# Patient Record
Sex: Female | Born: 1975 | Race: Black or African American | Hispanic: No | Marital: Married | Smoking: Former smoker
Health system: Southern US, Community
[De-identification: ages and names within clinical notes are randomized; demographics above are authoritative.]

## PROBLEM LIST (undated history)

## (undated) DIAGNOSIS — R51 Headache: Secondary | ICD-10-CM

## (undated) DIAGNOSIS — Z8619 Personal history of other infectious and parasitic diseases: Secondary | ICD-10-CM

## (undated) HISTORY — DX: Personal history of other infectious and parasitic diseases: Z86.19

## (undated) HISTORY — DX: Headache: R51

---

## 2000-01-08 ENCOUNTER — Emergency Department (HOSPITAL_COMMUNITY): Admission: EM | Admit: 2000-01-08 | Discharge: 2000-01-08 | Payer: Self-pay | Admitting: Emergency Medicine

## 2000-02-10 ENCOUNTER — Emergency Department (HOSPITAL_COMMUNITY): Admission: EM | Admit: 2000-02-10 | Discharge: 2000-02-10 | Payer: Self-pay | Admitting: Emergency Medicine

## 2000-02-10 ENCOUNTER — Encounter: Payer: Self-pay | Admitting: Emergency Medicine

## 2000-02-25 ENCOUNTER — Inpatient Hospital Stay (HOSPITAL_COMMUNITY): Admission: AD | Admit: 2000-02-25 | Discharge: 2000-02-25 | Payer: Self-pay | Admitting: *Deleted

## 2000-04-12 ENCOUNTER — Inpatient Hospital Stay (HOSPITAL_COMMUNITY): Admission: AD | Admit: 2000-04-12 | Discharge: 2000-04-12 | Payer: Self-pay | Admitting: *Deleted

## 2000-06-14 ENCOUNTER — Other Ambulatory Visit: Admission: RE | Admit: 2000-06-14 | Discharge: 2000-06-14 | Payer: Self-pay | Admitting: Obstetrics

## 2000-07-16 ENCOUNTER — Inpatient Hospital Stay (HOSPITAL_COMMUNITY): Admission: AD | Admit: 2000-07-16 | Discharge: 2000-07-16 | Payer: Self-pay | Admitting: Obstetrics

## 2000-08-14 ENCOUNTER — Other Ambulatory Visit: Admission: RE | Admit: 2000-08-14 | Discharge: 2000-08-14 | Payer: Self-pay | Admitting: Obstetrics

## 2000-09-30 ENCOUNTER — Inpatient Hospital Stay (HOSPITAL_COMMUNITY): Admission: AD | Admit: 2000-09-30 | Discharge: 2000-10-03 | Payer: Self-pay | Admitting: *Deleted

## 2000-12-10 ENCOUNTER — Emergency Department (HOSPITAL_COMMUNITY): Admission: EM | Admit: 2000-12-10 | Discharge: 2000-12-10 | Payer: Self-pay | Admitting: Emergency Medicine

## 2002-04-02 ENCOUNTER — Emergency Department (HOSPITAL_COMMUNITY): Admission: EM | Admit: 2002-04-02 | Discharge: 2002-04-02 | Payer: Self-pay | Admitting: Emergency Medicine

## 2004-08-21 ENCOUNTER — Emergency Department (HOSPITAL_COMMUNITY): Admission: EM | Admit: 2004-08-21 | Discharge: 2004-08-21 | Payer: Self-pay | Admitting: Emergency Medicine

## 2005-09-22 ENCOUNTER — Ambulatory Visit: Payer: Self-pay | Admitting: Family Medicine

## 2006-05-22 ENCOUNTER — Ambulatory Visit: Payer: Self-pay | Admitting: Internal Medicine

## 2006-05-26 ENCOUNTER — Encounter: Payer: Self-pay | Admitting: Internal Medicine

## 2006-05-26 LAB — CONVERTED CEMR LAB

## 2006-05-29 ENCOUNTER — Encounter: Payer: Self-pay | Admitting: Internal Medicine

## 2006-05-29 ENCOUNTER — Other Ambulatory Visit: Admission: RE | Admit: 2006-05-29 | Discharge: 2006-05-29 | Payer: Self-pay | Admitting: Internal Medicine

## 2006-05-29 ENCOUNTER — Ambulatory Visit: Payer: Self-pay | Admitting: Internal Medicine

## 2006-06-23 ENCOUNTER — Ambulatory Visit: Payer: Self-pay | Admitting: Family Medicine

## 2007-06-18 ENCOUNTER — Encounter: Payer: Self-pay | Admitting: Internal Medicine

## 2007-11-13 ENCOUNTER — Ambulatory Visit: Payer: Self-pay | Admitting: Internal Medicine

## 2007-11-13 LAB — CONVERTED CEMR LAB
ALT: 14 units/L (ref 0–35)
AST: 20 units/L (ref 0–37)
Albumin: 4.1 g/dL (ref 3.5–5.2)
Alkaline Phosphatase: 43 units/L (ref 39–117)
BUN: 7 mg/dL (ref 6–23)
Basophils Absolute: 0 10*3/uL (ref 0.0–0.1)
Basophils Relative: 0.7 % (ref 0.0–1.0)
Bilirubin Urine: NEGATIVE
Bilirubin, Direct: 0.1 mg/dL (ref 0.0–0.3)
Blood in Urine, dipstick: NEGATIVE
CO2: 31 meq/L (ref 19–32)
Calcium: 9.8 mg/dL (ref 8.4–10.5)
Chloride: 106 meq/L (ref 96–112)
Cholesterol: 221 mg/dL (ref 0–200)
Creatinine, Ser: 0.6 mg/dL (ref 0.4–1.2)
Direct LDL: 128.2 mg/dL
Eosinophils Absolute: 0 10*3/uL (ref 0.0–0.6)
Eosinophils Relative: 0.7 % (ref 0.0–5.0)
GFR calc Af Amer: 152 mL/min
GFR calc non Af Amer: 126 mL/min
Glucose, Bld: 74 mg/dL (ref 70–99)
Glucose, Urine, Semiquant: NEGATIVE
HCT: 38.6 % (ref 36.0–46.0)
HDL: 80.8 mg/dL (ref >39.0)
Hemoglobin: 12.8 g/dL (ref 12.0–15.0)
Ketones, urine, test strip: NEGATIVE
Lymphocytes Relative: 33.7 % (ref 12.0–46.0)
MCHC: 33.2 g/dL (ref 30.0–36.0)
MCV: 95.1 fL (ref 78.0–100.0)
Monocytes Absolute: 0.5 10*3/uL (ref 0.2–0.7)
Monocytes Relative: 9.9 % (ref 3.0–11.0)
Neutro Abs: 2.6 10*3/uL (ref 1.4–7.7)
Neutrophils Relative %: 55 % (ref 43.0–77.0)
Nitrite: POSITIVE
Platelets: 239 10*3/uL (ref 150–400)
Potassium: 4.3 meq/L (ref 3.5–5.1)
RBC: 4.06 M/uL (ref 3.87–5.11)
RDW: 11.9 % (ref 11.5–14.6)
Sodium: 143 meq/L (ref 135–145)
Specific Gravity, Urine: 1.02
TSH: 0.64 microintl units/mL (ref 0.35–5.50)
Total Bilirubin: 0.7 mg/dL (ref 0.3–1.2)
Total CHOL/HDL Ratio: 2.7
Total Protein: 6.9 g/dL (ref 6.0–8.3)
Triglycerides: 40 mg/dL (ref 0–149)
Urobilinogen, UA: 1
VLDL: 8 mg/dL (ref 0–40)
WBC Urine, dipstick: NEGATIVE
WBC: 4.7 10*3/uL (ref 4.5–10.5)
pH: 7.5

## 2007-11-26 ENCOUNTER — Telehealth: Payer: Self-pay | Admitting: Internal Medicine

## 2008-01-14 ENCOUNTER — Encounter: Payer: Self-pay | Admitting: Internal Medicine

## 2008-01-14 ENCOUNTER — Other Ambulatory Visit: Admission: RE | Admit: 2008-01-14 | Discharge: 2008-01-14 | Payer: Self-pay | Admitting: Internal Medicine

## 2008-01-14 ENCOUNTER — Ambulatory Visit: Payer: Self-pay | Admitting: Internal Medicine

## 2008-01-14 DIAGNOSIS — N926 Irregular menstruation, unspecified: Secondary | ICD-10-CM | POA: Insufficient documentation

## 2008-06-26 ENCOUNTER — Ambulatory Visit: Payer: Self-pay | Admitting: Internal Medicine

## 2008-06-26 ENCOUNTER — Telehealth: Payer: Self-pay | Admitting: Internal Medicine

## 2008-06-26 DIAGNOSIS — G43019 Migraine without aura, intractable, without status migrainosus: Secondary | ICD-10-CM | POA: Insufficient documentation

## 2008-06-26 DIAGNOSIS — F4322 Adjustment disorder with anxiety: Secondary | ICD-10-CM | POA: Insufficient documentation

## 2008-06-26 DIAGNOSIS — G43009 Migraine without aura, not intractable, without status migrainosus: Secondary | ICD-10-CM | POA: Insufficient documentation

## 2008-07-02 ENCOUNTER — Ambulatory Visit: Payer: Self-pay | Admitting: Internal Medicine

## 2009-01-02 ENCOUNTER — Ambulatory Visit: Payer: Self-pay | Admitting: Family Medicine

## 2009-01-02 DIAGNOSIS — J039 Acute tonsillitis, unspecified: Secondary | ICD-10-CM | POA: Insufficient documentation

## 2010-07-06 ENCOUNTER — Other Ambulatory Visit: Admission: RE | Admit: 2010-07-06 | Discharge: 2010-07-06 | Payer: Self-pay | Admitting: Internal Medicine

## 2010-07-06 ENCOUNTER — Ambulatory Visit: Payer: Self-pay | Admitting: Internal Medicine

## 2010-07-06 DIAGNOSIS — R519 Headache, unspecified: Secondary | ICD-10-CM | POA: Insufficient documentation

## 2010-07-06 DIAGNOSIS — F17200 Nicotine dependence, unspecified, uncomplicated: Secondary | ICD-10-CM | POA: Insufficient documentation

## 2010-07-06 DIAGNOSIS — R51 Headache: Secondary | ICD-10-CM | POA: Insufficient documentation

## 2010-07-06 DIAGNOSIS — M542 Cervicalgia: Secondary | ICD-10-CM | POA: Insufficient documentation

## 2010-07-06 LAB — CONVERTED CEMR LAB
Bilirubin Urine: NEGATIVE
Glucose, Urine, Semiquant: NEGATIVE
Nitrite: NEGATIVE
Prolactin: 12.7 ng/mL
Protein, U semiquant: NEGATIVE
Specific Gravity, Urine: 1.025
Urobilinogen, UA: 0.2
WBC Urine, dipstick: NEGATIVE
pH: 5.5

## 2010-07-08 ENCOUNTER — Encounter: Payer: Self-pay | Admitting: *Deleted

## 2010-07-08 LAB — CONVERTED CEMR LAB
ALT: 12 units/L (ref 0–35)
AST: 18 units/L (ref 0–37)
Albumin: 4.3 g/dL (ref 3.5–5.2)
Alkaline Phosphatase: 41 units/L (ref 39–117)
BUN: 12 mg/dL (ref 6–23)
Basophils Absolute: 0 10*3/uL (ref 0.0–0.1)
Basophils Relative: 0.4 % (ref 0.0–3.0)
Bilirubin, Direct: 0.1 mg/dL (ref 0.0–0.3)
CO2: 27 meq/L (ref 19–32)
Calcium: 9.5 mg/dL (ref 8.4–10.5)
Chloride: 104 meq/L (ref 96–112)
Cholesterol: 205 mg/dL — ABNORMAL HIGH (ref 0–200)
Creatinine, Ser: 0.7 mg/dL (ref 0.4–1.2)
Direct LDL: 117.1 mg/dL
Eosinophils Absolute: 0 10*3/uL (ref 0.0–0.7)
Eosinophils Relative: 0.4 % (ref 0.0–5.0)
Free T4: 0.87 ng/dL (ref 0.60–1.60)
GFR calc non Af Amer: 126.88 mL/min (ref >60)
Glucose, Bld: 83 mg/dL (ref 70–99)
HCT: 37.3 % (ref 36.0–46.0)
HDL: 73 mg/dL (ref >39.00)
Hemoglobin: 12.9 g/dL (ref 12.0–15.0)
Lymphocytes Relative: 32.6 % (ref 12.0–46.0)
Lymphs Abs: 2 10*3/uL (ref 0.7–4.0)
MCHC: 34.5 g/dL (ref 30.0–36.0)
MCV: 96.5 fL (ref 78.0–100.0)
Monocytes Absolute: 0.5 10*3/uL (ref 0.1–1.0)
Monocytes Relative: 8.2 % (ref 3.0–12.0)
Neutro Abs: 3.5 10*3/uL (ref 1.4–7.7)
Neutrophils Relative %: 58.4 % (ref 43.0–77.0)
Platelets: 257 10*3/uL (ref 150.0–400.0)
Potassium: 4.4 meq/L (ref 3.5–5.1)
RBC: 3.87 M/uL (ref 3.87–5.11)
RDW: 12.5 % (ref 11.5–14.6)
Sodium: 138 meq/L (ref 135–145)
TSH: 0.85 microintl units/mL (ref 0.35–5.50)
Total Bilirubin: 0.6 mg/dL (ref 0.3–1.2)
Total CHOL/HDL Ratio: 3
Total Protein: 7.3 g/dL (ref 6.0–8.3)
Triglycerides: 47 mg/dL (ref 0.0–149.0)
VLDL: 9.4 mg/dL (ref 0.0–40.0)
WBC: 6 10*3/uL (ref 4.5–10.5)

## 2010-07-11 ENCOUNTER — Telehealth: Payer: Self-pay | Admitting: Internal Medicine

## 2010-07-13 LAB — CONVERTED CEMR LAB: Pap Smear: NEGATIVE

## 2010-10-25 NOTE — Progress Notes (Signed)
Summary: vomiting and ha  Phone Note From Other Clinic   Caller: [facility] Summary of Call: Pt stopped by over at the [facility] office because she wasn't feeling good. Pt is having a vomiting and ha but no fever. I told Louellen that if they didn't want to see her, she can either come here or go to ED. Louellen said that she would tell the pt. Initial call taken by: Romualdo Bolk, CMA,  June 26, 2008 9:32 AM  Follow-up for Phone Call        Pt was informed to go to the ER or to the [place] office. Follow-up by: Hilarie Fredrickson,  June 26, 2008 9:40 AM

## 2010-10-25 NOTE — Letter (Signed)
Summary: Out of Work  Adult nurse at [employer]  [address]   [place], [place] [address]   Phone: [phone]  Fax: [phone]    June 26, 2008   Employee:  LIANNE CARRETO [facility]    To Whom It May Concern:   For Medical reasons, please excuse the above named employee from work for the following dates:  Start:   October  2nd   End:   October 5th   If you need additional information, please feel free to contact our office.         Sincerely,    Madelin Headings MD

## 2010-10-25 NOTE — Assessment & Plan Note (Signed)
Summary: ha/nausea/njr   Vital Signs:  Patient Profile:   35 Years Old Female Height:     63 inches Weight:      106 pounds Temp:     98.4 degrees F oral Pulse rate:   80 / minute BP sitting:   120 / 80  (right arm) Cuff size:   regular  Vitals Entered By: Romualdo Bolk, CMA (June 26, 2008 1:17 PM)                 Chief Complaint:  Headache.  History of Present Illness: Kathy Steele is here for an acute workin for a  severe headache that started on 06/24/08. Pt is also having chills and vomiting. Pt states that she is sensitive to light and sound.  Took  tylenol sinus.   Onset in from of head with throbbing sensation. Chills with nausea but no fever cough uri o uti signs   Missed work yesterday    and  cold and hot .   Went to therapy this am and therapistrec rx for anxiety also. to go weekly .  Marland Kitchen  On ocp  s for 1- plus  .  recent soda drinking   from stress.   no etoh.   doesnt think its related to periods. Son was attacked by a  do  on the face and has ahd therapy doing better .  Dayton Scrape    [id number]  [id number] is the psychologist.    Prior Medications Reviewed Using: Patient Recall  Prior Medication List:  OCELLA 3-0.03 MG  TABS (DROSPIRENONE-ETHINYL ESTRADIOL) 1 by mouth once daily.   Current Allergies (reviewed today): No known allergies   Past Medical History:    childbirth  g1p1    chikenpox   Past Surgical History:    CB  10/01/00    Family History:    Family History Hypertension  mom and gm and aunt      DM   uncles paternal    MI  maternal grandparents      neg colon cancer depression,      ? blood clot in GM     Family History of Arthritis    ? no fam hx of migraine.   Social History:    hh or 4 fiance, husband son and step son.    Mom smokes when visits.      sleep usually ok    Single  trainer works long days  [employer]    Never Smoked    Alcohol use-no    Regular exercise-no    to be married June 6      Review of  Systems  The patient denies fever, vision loss, dyspnea on exertion, peripheral edema, prolonged cough, hemoptysis, abdominal pain, melena, hematochezia, severe indigestion/heartburn, transient blindness, abnormal bleeding, and enlarged lymph nodes.     Physical Exam  General:     well-developed and well-nourished.  in mild to moderate distress    in dark room  Head:     normocephalic, atraumatic, and no abnormalities observed.   Eyes:     vision grossly intact, pupils equal, pupils round, and pupils reactive to light.  photophobia Ears:     R ear normal and L ear normal.   Nose:     no external deformity and no nasal discharge.   Mouth:     good dentition and pharynx pink and moist.   Neck:     No deformities, masses, or tenderness noted.no  thyromegaly.   Lungs:     Normal respiratory effort, chest expands symmetrically. Lungs are clear to auscultation, no crackles or wheezes. Heart:     Normal rate and regular rhythm. S1 and S2 normal without gallop, murmur, click, rub or other extra sounds. Abdomen:     Bowel sounds positive,abdomen soft and non-tender without masses, organomegaly or hernias noted. Msk:     normal ROM and no joint tenderness.   Pulses:     lintact Extremities:     no cce  Neurologic:     strength normal in all extremities and DTRs symmetrical and normal.  non focal Skin:     turgor normal and color normal.   Cervical Nodes:     No lymphadenopathy noted Psych:     anxious and tearful in mild distresss coherent  and appropriate.    Impression & Recommendations:  Problem # 1:  MIGRAINE W/O AURA W/INTRACT W/O STATUS MIGRNOSUS (ICD-346.11) Assessment: New non focal exam. disc and rx toradol and phenergan .  alarm signs discussed and ho x 1 .  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 1 by mouth once daily. 2)  Promethazine Hcl 25 Mg Tabs (Promethazine hcl) .Marland Kitchen.. 1 by mouth q 4-6 hours as needed nausea or vomiting .  Other Orders: Ketorolac-Toradol 15mg  (U9811) Admin of Therapeutic Inj  intramuscular or subcutaneous (91478)   Patient Instructions: 1)  take anti nausea medication  and no  work today.   2)  after about 6 hours can take 600-800mg  of ibuprofen for headache is not resolving   (  3-4 advil)   3)  Fluids and rest for now . 4)  schedule rov next week for follow up anxiety ( can use sda) . 5)  Call on call person if high fever or feel getting worse .     Prescriptions: PROMETHAZINE HCL 25 MG TABS (PROMETHAZINE HCL) 1 by mouth q 4-6 hours as needed nausea or vomiting .  #20 x 0   Entered and Authorized by:   Madelin Headings MD   Signed by:   Madelin Headings MD on 06/26/2008   Method used:   Elect[facility]ically to        [id number]       [address]       [place], [place]  [address]       Ph: [phone] or [phone]       Fax: [phone]   RxID:   [id number]  ]  Medication Administration  Injection # 1:    Medication: Ketorolac-Toradol 15mg     Diagnosis: COMMON MIGRAINE (ICD-346.10)    Route: IM    Site: LUOQ gluteus    Exp Date: 05/26/2009    Lot #: [id number]    Mfr: novaplus    Comments: Gave 60mg     Patient tolerated injection without complications    Given by: Romualdo Bolk, CMA (June 26, 2008 2:16 PM)  Orders Added: 1)  Ketorolac-Toradol 15mg  [J1885] 2)  Admin of Therapeutic Inj  intramuscular or subcutaneous [96372] 3)  Est. Patient Level IV [88416]

## 2010-10-25 NOTE — Assessment & Plan Note (Signed)
Summary: PAIN IN EYES, SWOLLEN NECK/LD   Vital Signs:  Patient profile:   35 year old female Weight:      112 pounds BMI:     19.91 Temp:     98.3 degrees F oral BP sitting:   102 / 72  (left arm) Cuff size:   large  Vitals Entered By: Alfred Levins, CMA (January 02, 2009 10:36 AM) CC: st, swoolen gland, no fever, chills, weak x3 days   History of Present Illness: 3 days of bad ST, HA, sinus pressure, and PND. No cough or fever or NVD.   Allergies (verified): No Known Drug Allergies  Past History:  Past Medical History:    Reviewed history from 06/26/2008 and no changes required:    childbirth  g1p1    chikenpox   Review of Systems  The patient denies anorexia, fever, weight loss, weight gain, vision loss, decreased hearing, hoarseness, chest pain, syncope, dyspnea on exertion, peripheral edema, prolonged cough, hemoptysis, abdominal pain, melena, hematochezia, severe indigestion/heartburn, hematuria, incontinence, genital sores, muscle weakness, suspicious skin lesions, transient blindness, difficulty walking, depression, unusual weight change, abnormal bleeding, enlarged lymph nodes, angioedema, breast masses, and testicular masses.    Physical Exam  General:  Well-developed,well-nourished,in no acute distress; alert,appropriate and cooperative throughout examination Head:  Normocephalic and atraumatic without obvious abnormalities. No apparent alopecia or balding. Eyes:  No corneal or conjunctival inflammation noted. EOMI. Perrla. Funduscopic exam benign, without hemorrhages, exudates or papilledema. Vision grossly normal. Ears:  left is clear. Right TM is slightly pink Nose:  External nasal examination shows no deformity or inflammation. Nasal mucosa are pink and moist without lesions or exudates. Mouth:  posterior OP red and covered with exudate, especially the right tonsil Neck:  tender swollen right AC nodes   Impression & Recommendations:  Problem # 1:  TONSILLITIS,  EXUDATIVE (ICD-463)  Complete Medication List: 1)  Ocella 3-0.03 Mg Tabs (Drospirenone-ethinyl estradiol) .Marland Kitchen.. 1 by mouth once daily. 2)  Promethazine Hcl 25 Mg Tabs (Promethazine hcl) .Marland Kitchen.. 1 by mouth q 4-6 hours as needed nausea or vomiting . 3)  Lexapro 10 Mg Tabs (Escitalopram oxalate) .... 1/2 by mouth once daily for about a week then can increase to 1 by mouth once daily 4)  Ativan 0.5 Mg Tabs (Lorazepam) .... 1/2 to 1 by mouth three times a day as needed anxiety attack 5)  Omnicef 300 Mg Caps (Cefdinir) .... 2 once daily  Patient Instructions: 1)  Please schedule a follow-up appointment as needed .  Prescriptions: OMNICEF 300 MG CAPS (CEFDINIR) 2 once daily  #20 x 0   Entered and Authorized by:   Nelwyn Salisbury MD   Signed by:   Nelwyn Salisbury MD on 01/02/2009   Method used:   [facility]        [id number]       [address]       [place], [place]  [address]       Ph: [phone] or [phone]       Fax: [phone]   RxID:   [id number]

## 2010-10-25 NOTE — Progress Notes (Signed)
Summary: refill and blood work results   Phone Note Call from Patient Call back at Home Phone [phone]   Caller: patient live Call For: Kathy Steele  Summary of Call: She had an apt Monday for her physical.  Due to the weather she is now scheduled for 03-02-08.  She needs Ocella 3-0.03 mg  [facility] [address]   She already had her blood work done and would like the results.    Initial call taken by: Roselle Locus,  November 26, 2007 12:44 PM  Follow-up for Phone Call        Sent thru EMR. Left message to call back. Follow-up by: Romualdo Bolk, CMA,  November 26, 2007 1:55 PM  Additional Follow-up for Phone Call Additional follow up Details #1::        Please call pt back at her work number at [phone]. Thanks. Additional Follow-up by: Lynann Beaver CMA,  November 27, 2007 11:15 AM    Additional Follow-up for Phone Call Additional follow up Details #2::    please find a place for her cpx before june      shannon Follow-up by: Madelin Headings MD,  December 01, 2007 10:55 PM  Additional Follow-up for Phone Call Additional follow up Details #3:: Details for Additional Follow-up Action Taken: Left message to call back. Can she do 12/24/07 at 1:15pm? ..................................................................Marland KitchenRomualdo Bolk, CMA  December 02, 2007 1:01 PM Pt aware and appts made. Additional Follow-up by: Romualdo Bolk, CMA,  December 02, 2007 1:11 PM  New/Updated Medications: OCELLA 3-0.03 MG  TABS (DROSPIRENONE-ETHINYL ESTRADIOL) 1 by mouth once daily.   Prescriptions: OCELLA 3-0.03 MG  TABS (DROSPIRENONE-ETHINYL ESTRADIOL) 1 by mouth once daily.  #28 x 3   Entered by:   Romualdo Bolk, CMA   Authorized by:   Madelin Headings MD   Signed by:   Romualdo Bolk, CMA on 11/26/2007   Method used:   Elect[facility]ically [facility] to ...       [id number]       [address]       [place], [place]  [address]       Ph: [phone] or [phone]    Fax: [phone]   RxID:   [id number]

## 2010-10-25 NOTE — Letter (Signed)
Summary: Generic Letter  [place] at [facility]  [address] [place], [place] [address]   Phone: [phone]  Fax: [phone]    07/08/2010  [facility] [address] [place], [place]  [address]  Dear Ms. MCCOY,  (1) TSH (TSH)   FastTSH                   0.85 uIU/mL                 0.35-5.50  Tests: (2) Hepatic/Liver Function Panel (HEPATIC)   Total Bilirubin           0.6 mg/dL                   5.7-8.4   Direct Bilirubin          0.1 mg/dL                   6.9-6.2   Alkaline Phosphatase      41 U/L                      39-117   AST                       18 U/L                      0-37   ALT                       12 U/L                      0-35   Total Protein             7.3 g/dL                    9.5-2.8   Albumin                   4.3 g/dL                    4.1-3.2  Tests: (3) CBC Platelet w/Diff (CBCD)   White Cell Count          6.0 K/uL                    4.5-10.5   Red Cell Count            3.87 Mil/uL                 3.87-5.11   Hemoglobin                12.9 g/dL                   44.0-10.2   Hematocrit                37.3 %                      36.0-46.0   MCV                       96.5 fl                     78.0-100.0   MCHC  34.5 g/dL                   16.1-09.6   RDW                       12.5 %                      11.5-14.6   Platelet Count            257.0 K/uL                  150.0-400.0   Neutrophil %              58.4 %                      43.0-77.0   Lymphocyte %              32.6 %                      12.0-46.0   Monocyte %                8.2 %                       3.0-12.0   Eosinophils%              0.4 %                       0.0-5.0   Basophils %               0.4 %                       0.0-3.0   Neutrophill Absolute      3.5 K/uL                    1.4-7.7   Lymphocyte Absolute       2.0 K/uL                    0.7-4.0   Monocyte Absolute         0.5 K/uL                    0.1-1.0  Eosinophils, Absolute                          0.0 K/uL                    0.0-0.7   Basophils Absolute        0.0 K/uL                    0.0-0.1  Tests: (4) BMP (METABOL)   Sodium                    138 mEq/L                   135-145   Potassium                 4.4 mEq/L                   3.5-5.1   Chloride  104 mEq/L                   96-112   Carbon Dioxide            27 mEq/L                    19-32   Glucose                   83 mg/dL                    10-27   BUN                       12 mg/dL                    2-53   Creatinine                0.7 mg/dL                   6.6-4.4   Calcium                   9.5 mg/dL                   0.3-47.4   GFR                       126.88 mL/min               >60  Tests: (5) Lipid Panel (LIPID)   Cholesterol          [H]  205 mg/dL                   2-595     ATP III Classification            Desirable:  < 200 mg/dL                    Borderline High:  200 - 239 mg/dL               High:  > = 240 mg/dL   Triglycerides             47.0 mg/dL                  6.3-875.6     Normal:  <150 mg/dL     Borderline High:  433 - 199 mg/dL   HDL                       29.51 mg/dL                 >88.41   VLDL Cholesterol          9.4 mg/dL                   6.6-06.3  CHO/HDL Ratio:  CHD Risk                             3                    Men          Women     1/2 Average Risk     3.4          3.3  Average Risk          5.0          4.4     2X Average Risk          9.6          7.1     3X Average Risk          15.0          11.0                           Tests: (6) T4, Free (FT4R)   Free T4                   0.87 ng/dL                  0.60-1.60  Tests: (7) Cholesterol LDL - Direct (DIRLDL)  Cholesterol LDL - Direct                             117.1 mg/dL     Optimal:  <811 mg/dL     Near or Above Optimal:  100-129 mg/dL     Borderline High:  914-782 mg/dL     High:  956-213 mg/dL     Very High:  >086 mg/dL   Your labs are normal. If  you have any questions, please give [place] a call at [phone].      Sincerely,   Tor Netters, CMA (AAMA)

## 2010-10-25 NOTE — Assessment & Plan Note (Signed)
Summary: cpx/pap/njr   Vital Signs:  Patient profile:   35 year old female Menstrual status:  irregular LMP:     05/22/2010 Height:      62.75 inches Weight:      115 pounds BMI:     20.61 Pulse rate:   60 / minute BP sitting:   100 / 60  (right arm) Cuff size:   regular  Vitals Entered By: Romualdo Bolk, CMA Duncan Dull) (July 06, 2010 3:04 PM) CC: CPX with pap LMP (date): 05/22/2010     Menstrual Status irregular Enter LMP: 05/22/2010 Last PAP Result done   History of Present Illness: Kathy Steele comes in today  for preventive visit  Her last visit was 2 years ago. since that time she has married   and is on no rx medications  however  has a number of concerns :  She still has  recurrent HEadaches and has neck and tenderness left neck without injury or associated signs . Right handed   ? if should go to chiropractor.S   ince last visit trying to deal with this headache  taking excedrin  extremem    most days of week.   3-4   days per week  for the last 3-4 months.   ? uti  frequency increasing. last week and now    seems to be going away.  Tobacco:  she has begin tobacco a few day a week to calm her down.   Still is anxious and had se of med of lexapro given 2 years ago.   She still has irreg menses lasting  3-4 days  off ocps  conisdering conception and no pregnancy over the last 6 months. no vision changes  balance problems weight changes .   No galactorrhea .  sleep 5 hours  ? from stresss       Preventive Screening-Counseling & Management  Alcohol-Tobacco     Alcohol drinks/day: <1     Alcohol type: wine     Smoking Status: never     Smoking Cessation Counseling: YES  Caffeine-Diet-Exercise     Caffeine use/day: 0     Does Patient Exercise: no  Hep-HIV-STD-Contraception     Dental Visit-last 6 months no  Safety-Violence-Falls     Seat Belt Use: yes     Firearms in the Home: no firearms in the home     Smoke Detectors: yes  Current Medications  (verified): 1)  None  Allergies (verified): 1)  Lexapro (Escitalopram Oxalate)  Past History:  Past Medical History: childbirth  g1p1 chikenpox  Headaches   Past History:  Care Management: None Current  Family History: Family History Hypertension  mom and gm and aunt   DM   uncles paternal  GM maternal  MI  maternal grandparents   neg colon cancer depression,   ? blood clot in GM  Family History of Arthritis   ? no fam hx of migraine.  OA   Sibs good .   Social History: hh or 3 , husband son  Mom smokes when visits.   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   some at home  ocass togacco  Alcohol use-no Regular exercise-no sleep 5 hours   shift 2- 10 30  Caffeine use/day:  0 Seat Belt Use:  yes Dental Care w/in 6 mos.:  no  Review of Systems  The patient denies anorexia, fever, weight loss,  weight gain, vision loss, decreased hearing, chest pain, syncope, dyspnea on exertion, peripheral edema, prolonged cough, hemoptysis, abdominal pain, melena, hematochezia, severe indigestion/heartburn, hematuria, incontinence, genital sores, muscle weakness, transient blindness, difficulty walking, depression, unusual weight change, abnormal bleeding, enlarged lymph nodes, angioedema, and breast masses.    Physical Exam  General:  alert, well-developed, well-nourished, and well-hydrated.   Head:  normocephalic and atraumatic.   Eyes:  PERRL, EOMs full, conjunctiva clear  Ears:  R ear normal and L ear normal.   Nose:  no external deformity, no external erythema, and no nasal discharge.   Mouth:  good dentition and pharynx pink and moist.   Neck:  supple, full ROM, and no masses.  tight trapezious   left  Breasts:  No mass, nodules, thickening, tenderness, bulging, retraction, inflamation, nipple discharge or skin changes noted.   Lungs:  Normal respiratory effort, chest expands symmetrically. Lungs are clear to auscultation, no  crackles or wheezes. Heart:  Normal rate and regular rhythm. S1 and S2 normal without gallop, murmur, click, rub or other extra sounds.no lifts.   Abdomen:  Bowel sounds positive,abdomen soft and non-tender without masses, organomegaly or hernias noted. Genitalia:  Pelvic Exam:        External: normal female genitalia without lesions or masses        Vagina: normal without lesions or masses        Cervix: normal without lesions or masses        Adnexa: normal bimanual exam without masses or fullness        Uterus: normal by palpation        Pap smear: performed Msk:  normal ROM, no joint swelling, no joint warmth, no redness over joints, and no joint deformities.   Pulses:  pulses intact without delay   Extremities:  no clubbing cyanosis or edema  Neurologic:  alert & oriented X3, strength normal in all extremities, gait normal, and DTRs symmetrical and normal.  cn 3-12 ok  Skin:  turgor normal, color normal, no ecchymoses, and no petechiae.   Cervical Nodes:  No lymphadenopathy noted Axillary Nodes:  No palpable lymphadenopathy Psych:  Oriented X3, normally interactive, good eye contact, not depressed appearing, and slightly anxious.     Impression & Recommendations:  Problem # 1:  PREVENTIVE HEALTH CARE (ICD-V70.0)  Discussed nutrition,exercise,diet,healthy weight, vitamin D and calcium.   sleep   Orders: TLB-TSH (Thyroid Stimulating Hormone) (84443-TSH) TLB-Hepatic/Liver Function Pnl (80076-HEPATIC) TLB-CBC Platelet - w/Differential (85025-CBCD) TLB-BMP (Basic Metabolic Panel-BMET) (80048-METABOL) TLB-Lipid Panel (80061-LIPID) UA Dipstick w/o Micro (automated)  (81003) Venipuncture (56213) Specimen Handling (08657)  Problem # 2:  ROUTINE GYNECOLOGICAL EXAMINATION (ICD-V72.31)  pap done  Orders: Pap Smear, Thin Prep ( Collection of) (Q4696)  Problem # 3:  HEADACHE (ICD-784.0)  chronic daily  and probable rebound.  from the excederin   disc caffiene in med   . HO x 2  exercises  calendar  ha and meds   Orders: T-Prolactin [phone]) Headache Clinic Referral (Headache)  Her updated medication list for this problem includes:    Excedrin Migraine 250-250-65 Mg Tabs (Aspirin-acetaminophen-caffeine) ..... ? excedrin extreme  Problem # 4:  IRREGULAR MENSTRUAL CYCLE (ICD-626.4) some concern about fertility      The following medications were removed from the medication list:    Ocella 3-0.03 Mg Tabs (Drospirenone-ethinyl estradiol) .Marland Kitchen... 1 by mouth once daily.  Orders: T-Prolactin [phone]) TLB-T4 (Thyrox), Free [phone])  Problem # 5:  OTHER PSYCHOLOGICAL OR PHYSICAL STRESS NEC OTHER (ICD-V62.89)  disc  exercise other interventions     get HAs under control and then  then readdress as needed   Problem # 6:  TOBACCO USE (ICD-305.1) stop     Problem # 7:  NECK PAIN (ICD-723.1)  seems postural  and contributing  to HAs    disc and ho x 2 with exercises .  Her updated medication list for this problem includes:    Excedrin Migraine 250-250-65 Mg Tabs (Aspirin-acetaminophen-caffeine) ..... ? excedrin extreme  Complete Medication List: 1)  Excedrin Migraine 250-250-65 Mg Tabs (Aspirin-acetaminophen-caffeine) .... ? excedrin extreme  Other Orders: Admin 1st Vaccine (04540) Flu Vaccine 73yrs + (98119) Tdap => 9yrs IM (14782) Admin of Any Addtl Vaccine (95621)  Patient Instructions: 1)  i think your  HAs are now chronic and  being somewhat caused by taking excedrin so often . excedrin has caffiene in it. this can make your anxious and decrease sleep.  2)  Calendar your HAs and meds taken  3)  Get more sleep  7-8 hours regularly . 4)  Increase exercise such as walking  5)  Check work station.  to help posture 6)  Will contact you about a HA referral. 7)  Consider seeing a fertility gyne if no success after 6 months . 8)  Tobacco is very bad for your health and your loved ones ! You should stop smoking !   Flu Vaccine Consent Questions       Do you have a history of severe allergic reactions to this vaccine? no    Any prior history of allergic reactions to egg and/or gelatin? no    Do you have a sensitivity to the preservative Thimersol? no    Do you have a past history of Guillan-Barre Syndrome? no    Do you currently have an acute febrile illness? no    Have you ever had a severe reaction to latex? no    Vaccine information given and explained to patient? yes    Are you currently pregnant? no    Lot Number:[id number]   Exp Date:03/25/2011   Site Given  Left Deltoid IM Romualdo Bolk, CMA (AAMA)  July 06, 2010 3:08 PM   Immunizations Administered:  Tetanus Vaccine:    Vaccine Type: Tdap    Site: right deltoid    Mfr: GlaxoSmithKline    Dose: 0.5 ml    Route: IM    Given by: Romualdo Bolk, CMA (AAMA)    Exp. Date: 07/14/2012    Lot #: [id number]   Laboratory Results   Urine Tests    Routine Urinalysis   Color: yellow Appearance: Clear Glucose: negative   (Normal Range: Negative) Bilirubin: negative   (Normal Range: Negative) Ketone: 1+   (Normal Range: Negative) Spec. Gravity: 1.025   (Normal Range: 1.003-1.035) Blood: 1+   (Normal Range: Negative) pH: 5.5   (Normal Range: 5.0-8.0) Protein: negative   (Normal Range: Negative) Urobilinogen: 0.2   (Normal Range: 0-1) Nitrite: negative   (Normal Range: Negative) Leukocyte Esterace: negative   (Normal Range: Negative)    Comments: Rita Ohara  July 06, 2010 4:27 PM

## 2010-10-25 NOTE — Assessment & Plan Note (Signed)
Summary: CPX/ssc   Vital Signs:  Patient Profile:   35 Years Old Female Height:     63 inches Weight:      111 pounds Pulse rate:   78 / minute BP sitting:   100 / 60  (right arm) Cuff size:   regular  Vitals Entered By: Romualdo Bolk, CMA (January 14, 2008 1:24 PM)                 Chief Complaint:  Preventive Care.  History of Present Illness: Kathy Steele is here for a cpx with pap. LMP: 1 month ago irrg. Pt needs a refill on birth control pills.       LAST Mammogram:NA Pap: 2007 Td: less than 10 years  Doing well except ocass allergy symptom and stress symptom recently .Marland Kitchen    No pain but ? if could have  mild scoliosis posture different and no pain      Prior Medications Reviewed Using: Patient Recall  Prior Medication List:  OCELLA 3-0.03 MG  TABS (DROSPIRENONE-ETHINYL ESTRADIOL) 1 by mouth once daily.   Current Allergies (reviewed today): No known allergies   Past Medical History:    childbirth  g1p1    chikenpox   Family History:    Family History Hypertension  mom and gm and aunt      DM   uncles paternal    MI  maternal grandparents      neg colon cancer depression,      ? blood clot in GM     Family History of Arthritis  Social History:    hh or 4 fiance son and step son.    Mom smokes when visits.      sleep usually ok    Single  trainer works long days    Never Smoked    Alcohol use-no    Regular exercise-no    to be married June 6    Risk Factors:  Tobacco use:  never Alcohol use:  no Exercise:  no   Review of Systems       taking tylenol severe sinus in allergy season. for headaches.. some stress at work.    clammy and hard to talk.      Physical Exam  General:     Well-developed,well-nourished,in no acute distress; alert,appropriate and cooperative throughout examination Head:     normocephalic and atraumatic.   Eyes:     vision grossly intact, pupils equal, and pupils round.   Ears:     R ear normal, L ear  normal, and no external deformities.   Nose:     no external erythema and no nasal discharge.   Mouth:     pharynx pink and moist and no erythema.  smal discolored incisors Neck:     No deformities, masses, or tenderness noted.no thyromegaly.   Breasts:     No mass, nodules, thickening, tenderness, bulging, retraction, inflamation, nipple discharge or skin changes noted.   Lungs:     Normal respiratory effort, chest expands symmetrically. Lungs are clear to auscultation, no crackles or wheezes. Heart:     Normal rate and regular rhythm. S1 and S2 normal without gallop, murmur, click, rub or other extra sounds. Abdomen:     Bowel sounds positive,abdomen soft and non-tender without masses, organomegaly or hernias noted. Genitalia:     Pelvic Exam:        External: normal female genitalia without lesions or masses        Vagina:  normal without lesions or masses        Cervix: normal without lesions or masses        Adnexa: normal bimanual exam without masses or fullness        Uterus: normal by palpation        Pap smear: performed Msk:     normal ROM, no joint swelling, and no joint warmth.  slight scoliosis rib hump right  gait nnormal no spine tenderness Pulses:     normal Extremities:     no cce  Neurologic:     intact non focal Skin:     turgor normal, color normal, and no ecchymoses.   Cervical Nodes:     no anterior cervical adenopathy and no posterior cervical adenopathy.   Axillary Nodes:     no R axillary adenopathy and no L axillary adenopathy.   Inguinal Nodes:     no R inguinal adenopathy and no L inguinal adenopathy.   Psych:     Oriented X3, normally interactive, and good eye contact.   Additional Exam:     see labs     Impression & Recommendations:  Problem # 1:  PREVENTIVE HEALTH CARE (ICD-V70.0) Discussed nutrition,exercise,diet,healthy weight, vitamin D and calcium.   Problem # 2:  ROUTINE GYNECOLOGICAL EXAMINATION (ICD-V72.31) pap  done Orders: Obtaining Screening PAP Smear (Z6109)   Problem # 3:  ORAL CONTRACEPTION (ICD-V25.41) Assessment: Comment Only  Problem # 4:  IRREGULAR MENSTRUAL CYCLE (ICD-626.4) ciotinues on ocp q 3 months x 5 days    call if amenorrheaic 4-6 months and may need to hnge meds Her updated medication list for this problem includes:    Ocella 3-0.03 Mg Tabs (Drospirenone-ethinyl estradiol) .Marland Kitchen... 1 by mouth once daily.  Orders: Obtaining Screening PAP Smear (U0454)   Problem # 5:  OTHER PSYCHOLOGICAL OR PHYSICAL STRESS NEC OTHER (ICD-V62.89) reactive   disc lifestyle intervention and regular exercise time   call if needed     med not indicated at this time  Complete Medication List: 1)  Ocella 3-0.03 Mg Tabs (Drospirenone-ethinyl estradiol) .Marland Kitchen.. 1 by mouth once daily.   Patient Instructions: 1)  exercise  and stress reduction    2)  rov 1 year or as needed      Prescriptions: OCELLA 3-0.03 MG  TABS (DROSPIRENONE-ETHINYL ESTRADIOL) 1 by mouth once daily.  #1 pack x 12   Entered and Authorized by:   Madelin Headings MD   Signed by:   Madelin Headings MD on 01/14/2008   Method used:   Electronically sent to ...       [facility]  [address] [id number]       [address]       [place], [place]  [address]       Ph: [phone] or [phone]       Fax: [phone]   RxID:   [id number]  ]

## 2010-10-25 NOTE — Letter (Signed)
Summary: [facility]  [facility]   Imported By: Maryln Gottron 07/16/2008 16:21:30  _____________________________________________________________________  External Attachment:    Type:   Image     Comment:   External Document

## 2010-10-25 NOTE — Assessment & Plan Note (Signed)
Summary: rov/per shannon/mm   Vital Signs:  Patient Profile:   35 Years Old Female Height:     63 inches Weight:      106 pounds Pulse rate:   66 / minute BP sitting:   100 / 70  (left arm) Cuff size:   regular  Vitals Entered By: Romualdo Bolk, CMA (July 02, 2008 11:55 AM)                 Chief Complaint:  Follow up.  History of Present Illness: Kathy Steele is here for a follow up on headaches. Pt states that she is feeling much better. Toradol helped and then took  aleve advil.   no recurrance now.  Seeing counselor and feels anxiety may be helped by medication.  going almost weekly.  See past hx. Still nervous and gets anxiety attacks .  no meds no etoh.  work difficult.  See past hx  .  child had dog attack  and had separation anxiety.      Prior Medications Reviewed Using: Patient Recall  Prior Medication List:  OCELLA 3-0.03 MG  TABS (DROSPIRENONE-ETHINYL ESTRADIOL) 1 by mouth once daily. PROMETHAZINE HCL 25 MG TABS (PROMETHAZINE HCL) 1 by mouth q 4-6 hours as needed nausea or vomiting .   Current Allergies (reviewed today): No known allergies   Past Surgical History:    CB  10/01/00     Family History:    Family History Hypertension  mom and gm and aunt      DM   uncles paternal    MI  maternal grandparents      neg colon cancer depression,      ? blood clot in GM     Family History of Arthritis      ? no fam hx of migraine.     OA    Social History:    hh or 4 fiance, husband son and step son.    Mom smokes when visits.      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         no sig gi gu or neur signs    Physical Exam  General:     alert, well-developed, well-nourished, and well-hydrated.   Neurologic:     grossly normal Cervical Nodes:     No lymphadenopathy noted Psych:     normally interactive and good eye contact.  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 Discussed risk benefit  of medication discussed  .    and begin lexapro.  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   then have .fu  Complete Medication List: 1)  Ocella 3-0.03 Mg Tabs (Drospirenone-ethinyl estradiol) .Marland Kitchen.. 1 by mouth once daily. 2)  Promethazine Hcl 25 Mg Tabs (Promethazine hcl) .Marland Kitchen.. 1 by mouth q 4-6 hours as needed nausea or vomiting . 3)  Lexapro 10 Mg Tabs (Escitalopram oxalate) .... 1/2 by mouth once daily for about a week then can increase to 1 by mouth once daily 4)  Ativan 0.5 Mg Tabs (Lorazepam) .... 1/2 to 1 by mouth three times a day as needed anxiety attack   Patient Instructions: 1)  do a headache calendar  . can take aleve for the headache for now if needed. 2)  Begin lexapro as discussed and rov in 3 weeks . 3)  Can take ativan if needed for anxiety attacks.  50% of visit spent in counseling      25 minutes   Prescriptions: ATIVAN 0.5 MG TABS (LORAZEPAM) 1/2 to 1 by mouth three times a day as needed anxiety attack  #20 x 0   Entered and Authorized by:   Madelin Headings MD   Signed by:   Madelin Headings MD on 07/02/2008   Method used:   Print then Give to Patient   RxID:   [id number]  ]

## 2010-10-25 NOTE — Progress Notes (Signed)
Summary: lab  Phone Note Call from Patient Call back at [phone]   Caller: Patient-----voice mail Summary of Call: Received her lab results and urine test. Please call her back to explain her results. Initial call taken by: Warnell Forester,  July 11, 2010 2:38 PM  Follow-up for Phone Call        Phone Call Completed Follow-up by: Rudy Jew, RN,  July 11, 2010 5:21 PM

## 2010-12-05 ENCOUNTER — Telehealth: Payer: Self-pay | Admitting: Internal Medicine

## 2010-12-05 NOTE — Telephone Encounter (Signed)
Pt would like a referral to fertility specialist having trouble getting pregnant.

## 2010-12-06 NOTE — Telephone Encounter (Signed)
Order sent to [facility] 

## 2010-12-06 NOTE — Telephone Encounter (Signed)
Ok to do this 

## 2011-05-23 ENCOUNTER — Ambulatory Visit (INDEPENDENT_AMBULATORY_CARE_PROVIDER_SITE_OTHER): Payer: Medicare FFS | Admitting: Internal Medicine

## 2011-05-23 ENCOUNTER — Encounter: Payer: Self-pay | Admitting: Internal Medicine

## 2011-05-23 VITALS — BP 110/60 | HR 60 | Temp 98.3°F | Wt 112.0 lb

## 2011-05-23 DIAGNOSIS — J988 Other specified respiratory disorders: Secondary | ICD-10-CM

## 2011-05-23 DIAGNOSIS — F17200 Nicotine dependence, unspecified, uncomplicated: Secondary | ICD-10-CM

## 2011-05-23 MED ORDER — HYDROCODONE-HOMATROPINE 5-1.5 MG/5ML PO SYRP: 5.0000 mL | ORAL_SOLUTION | ORAL | Status: AC | PRN

## 2011-05-23 NOTE — Progress Notes (Signed)
  Subjective:    Patient ID: Kathy Steele, female    DOB: 08-Apr-1976, 35 y.o.   MRN: [id number]  HPI Patient comes in for an acute visit today the problem. Took care of husband  ;last week and    Now she is sick   And fever up and down  For  mucinex d , delsym for cough.  And zycam.  5 days.  Of illness weak and headache  And now congestion.  Then cough . Continuing.  Last fever 2 days ago.    No sob  Tobacco 2 x per week pre illness.   No asthma.    Review of Systems Neg curent fever sob hemoptysis other pain NVD but decrase appetite.  Past history family history social history reviewed in the electronic medical record. Supposed to be helping take care of sisters newborn baby who just came out of the neonatal ICU. Wonders if this is safe.    Objective:   Physical Exam  wdwn in nad  Congested  HEENT: Normocephalic ;atraumatic , Eyes;  PERRL, EOMs  Full, lids and conjunctiva clear,,Ears: no deformities, canals nl, TM landmarks normal, Nose: no deformity or discharge congested  Mouth : OP clear without lesion or edema . Face non tender Neck supple no nodes Chest:  Clear to A&P without wheezes rales or rhonchi CV:  S1-S2 no gallops or murmurs peripheral perfusion is normal No clubbing cyanosis or edema      Assessment & Plan:  Acute RTI  Seems viral resp      Expectant management. Would avoid neonate just out of NICU for now until getting better and then can  Use precautions .  Tobacco   Disc /dc

## 2011-05-23 NOTE — Patient Instructions (Addendum)
I think this is an acute respiratory illness  Probably viral and needs run its course. Your cough could last  Another 2-3 weeks.  But should not have fever or sever fatigue at that point.Rip [place] to take the decongestant in the meantime .   Cough med if helps for comfort.  If  Not beginning to improve in another 5-7 days or if fever call for advice.

## 2011-06-13 ENCOUNTER — Inpatient Hospital Stay (INDEPENDENT_AMBULATORY_CARE_PROVIDER_SITE_OTHER)
Admission: RE | Admit: 2011-06-13 | Discharge: 2011-06-13 | Disposition: A | Discharge status: Home / Self Care | Payer: Medicare FFS | Source: Ambulatory Visit | Attending: Family Medicine | Admitting: Family Medicine

## 2011-06-13 DIAGNOSIS — L989 Disorder of the skin and subcutaneous tissue, unspecified: Secondary | ICD-10-CM

## 2011-08-23 ENCOUNTER — Other Ambulatory Visit (INDEPENDENT_AMBULATORY_CARE_PROVIDER_SITE_OTHER): Payer: Medicare FFS

## 2011-08-23 DIAGNOSIS — Z0000 Encounter for general adult medical examination without abnormal findings: Secondary | ICD-10-CM

## 2011-08-23 LAB — LIPID PANEL
Cholesterol: 227 mg/dL — ABNORMAL HIGH (ref 0–200)
HDL: 90.8 mg/dL (ref >39.00)
Total CHOL/HDL Ratio: 3
Triglycerides: 50 mg/dL (ref 0.0–149.0)
VLDL: 10 mg/dL (ref 0.0–40.0)

## 2011-08-23 LAB — BASIC METABOLIC PANEL
BUN: 15 mg/dL (ref 6–23)
CO2: 25 mEq/L (ref 19–32)
Calcium: 8.8 mg/dL (ref 8.4–10.5)
Chloride: 107 mEq/L (ref 96–112)
Creatinine, Ser: 0.5 mg/dL (ref 0.4–1.2)
GFR: 182.71 mL/min (ref >60.00)
Glucose, Bld: 77 mg/dL (ref 70–99)
Potassium: 4 mEq/L (ref 3.5–5.1)
Sodium: 142 mEq/L (ref 135–145)

## 2011-08-23 LAB — POCT URINALYSIS DIPSTICK
Bilirubin, UA: NEGATIVE
Glucose, UA: NEGATIVE
Ketones, UA: NEGATIVE
Leukocytes, UA: NEGATIVE
Nitrite, UA: NEGATIVE
Protein, UA: NEGATIVE
Spec Grav, UA: 1.025
Urobilinogen, UA: 1
pH, UA: 6.5

## 2011-08-23 LAB — LDL CHOLESTEROL, DIRECT: Direct LDL: 131.3 mg/dL

## 2011-08-23 LAB — HEPATIC FUNCTION PANEL
ALT: 15 U/L (ref 0–35)
AST: 18 U/L (ref 0–37)
Albumin: 4.2 g/dL (ref 3.5–5.2)
Alkaline Phosphatase: 40 U/L (ref 39–117)
Bilirubin, Direct: 0 mg/dL (ref 0.0–0.3)
Total Bilirubin: 0.4 mg/dL (ref 0.3–1.2)
Total Protein: 7.5 g/dL (ref 6.0–8.3)

## 2011-08-23 LAB — CBC WITH DIFFERENTIAL/PLATELET
Basophils Absolute: 0 10*3/uL (ref 0.0–0.1)
Basophils Relative: 0.6 % (ref 0.0–3.0)
Eosinophils Absolute: 0 10*3/uL (ref 0.0–0.7)
Eosinophils Relative: 0.9 % (ref 0.0–5.0)
HCT: 36.6 % (ref 36.0–46.0)
Hemoglobin: 12.4 g/dL (ref 12.0–15.0)
Lymphocytes Relative: 34 % (ref 12.0–46.0)
Lymphs Abs: 1.5 10*3/uL (ref 0.7–4.0)
MCHC: 33.8 g/dL (ref 30.0–36.0)
MCV: 96.8 fl (ref 78.0–100.0)
Monocytes Absolute: 0.4 10*3/uL (ref 0.1–1.0)
Monocytes Relative: 8.3 % (ref 3.0–12.0)
Neutro Abs: 2.5 10*3/uL (ref 1.4–7.7)
Neutrophils Relative %: 56.2 % (ref 43.0–77.0)
Platelets: 302 10*3/uL (ref 150.0–400.0)
RBC: 3.78 Mil/uL — ABNORMAL LOW (ref 3.87–5.11)
RDW: 13.1 % (ref 11.5–14.6)
WBC: 4.5 10*3/uL (ref 4.5–10.5)

## 2011-08-23 LAB — TSH: TSH: 0.53 u[IU]/mL (ref 0.35–5.50)

## 2011-08-30 ENCOUNTER — Ambulatory Visit (INDEPENDENT_AMBULATORY_CARE_PROVIDER_SITE_OTHER): Payer: Medicare FFS | Admitting: Internal Medicine

## 2011-08-30 ENCOUNTER — Encounter: Payer: Self-pay | Admitting: Internal Medicine

## 2011-08-30 VITALS — BP 120/70 | HR 60 | Ht 63.0 in | Wt 116.0 lb

## 2011-08-30 DIAGNOSIS — Z23 Encounter for immunization: Secondary | ICD-10-CM | POA: Insufficient documentation

## 2011-08-30 DIAGNOSIS — Z0000 Encounter for general adult medical examination without abnormal findings: Secondary | ICD-10-CM | POA: Insufficient documentation

## 2011-08-30 DIAGNOSIS — Z3169 Encounter for other general counseling and advice on procreation: Secondary | ICD-10-CM | POA: Insufficient documentation

## 2011-08-30 NOTE — Progress Notes (Signed)
Subjective:    Patient ID: Kathy Steele, female    DOB: 1976/07/13, 35 y.o.   MRN: [id number]  HPI Patient comes in today for Preventive Health Care visit  No major change in health status since last visit . Is healthy. Has ? About fertility not using contraception and not getting pregnant not trying too hard but not successful.  Periods more reg thatn in the past.  Last pregnancy 9 years ago.  Last pap last year and normal    Review of Systems ROS:  GEN/ HEENTNo fever, significant weight changes sweats headaches vision problems hearing changes, CV/ PULM; No chest pain shortness of breath cough, syncope,edema  change in exercise tolerance. GI /GU: No adominal pain, vomiting, change in bowel habits. No blood in the stool. No significant GU symptoms. SKIN/HEME: ,no acute skin rashes suspicious lesions or bleeding. No lymphadenopathy, nodules, masses.  NEURO/ PSYCH:  No neurologic signs such as weakness numbness No depression anxiety. IMM/ Allergy: No unusual infections.  Allergy .   REST of 12 system review negative  Past Medical History  Diagnosis Date  . History of chickenpox   . Headache     History   Social History  . Marital Status: Married    Spouse Name: N/A    Number of Children: N/A  . Years of Education: N/A   Occupational History  . Not on file.   Social History Main Topics  . Smoking status: Current Some Day Smoker  . Smokeless tobacco: Not on file  . Alcohol Use: No  . Drug Use: Not on file  . Sexually Active: Not on file   Other Topics Concern  . 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  Family History  Problem Relation Age of Onset  . Hypertension Mother   . Diabetes Paternal Uncle   . Diabetes Maternal Grandmother   .  Stroke Maternal Grandmother   . Stroke Maternal Grandfather   . Arthritis    . Osteoarthritis      Allergies  Allergen Reactions  . Escitalopram Oxalate     REACTION: Nausea, Ha and felt spacy    No current outpatient prescriptions on file prior to visit.    BP 120/70  Pulse 60  Ht 5\' 3"  (1.6 m)  Wt 116 lb (52.617 kg)  BMI 20.55 kg/m2  LMP 08/26/2011        Objective:   Physical Exam Physical Exam: Vital signs reviewed YNW:GNFA is a well-developed well-nourished alert cooperative  aa female who appears her stated age in no acute distress.  HEENT: normocephalic  traumatic , Eyes: PERRL EOM's full, conjunctiva clear, Nares: paten,t no deformity discharge or tenderness., Ears: no deformity EAC's clear TMs with normal landmarks. Mouth: clear OP, no lesions, edema.  Moist mucous membranes. Dentition in adequate repair. NECK: supple without masses, thyromegaly or bruits. CHEST/PULM:  Clear to auscultation and percussion breath sounds equal no wheeze , rales or rhonchi. No chest wall deformities or tenderness. Breast: normal by inspection . No dimpling, discharge, masses, tenderness or discharge . LN: no cervical axillary inguinal adenopathy  CV: PMI is nondisplaced, S1 S2 no gallops, murmurs, rubs. Peripheral pulses are full without delay.No JVD .  ABDOMEN: Bowel sounds normal nontender  No guard or rebound,  no hepato splenomegal no CVA tenderness.  No hernia. Extremtities:  No clubbing cyanosis or edema, no acute joint swelling or redness no focal atrophy NEURO:  Oriented x3, cranial nerves 3-12 appear to be intact, no obvious focal weakness,gait within normal limits no abnormal reflexes or asymmetrical SKIN: No acute rashes normal turgor, color, no bruising or petechiae. PSYCH: Oriented, good eye contact, no obvious depression anxiety, cognition and judgment appear normal.     Lab Results  Component Value Date   WBC 4.5 08/23/2011   HGB 12.4 08/23/2011   HCT 36.6  08/23/2011   PLT 302.0 08/23/2011   GLUCOSE 77 08/23/2011   CHOL 227* 08/23/2011   TRIG 50.0 08/23/2011   HDL 90.80 08/23/2011   LDLDIRECT 131.3 08/23/2011   ALT 15 08/23/2011   AST 18 08/23/2011   NA 142 08/23/2011   K 4.0 08/23/2011   CL 107 08/23/2011   CREATININE 0.5 08/23/2011   BUN 15 08/23/2011   CO2 25 08/23/2011   TSH 0.53 08/23/2011       Assessment & Plan:  Preventive Health Care Counseled regarding healthy nutrition, exercise, sleep, injury prevention, calcium vit d and healthy weight .  Fertility  Problems  Disc basal body temp monitoring and suggest see fertility reproductive gyne  As she has had no pregnancy in over a year. Names given.

## 2011-08-30 NOTE — Patient Instructions (Signed)
Continue lifestyle intervention healthy eating and exercise . REC see gyne   For fertility work up.

## 2011-08-31 DIAGNOSIS — Z23 Encounter for immunization: Secondary | ICD-10-CM

## 2012-08-19 ENCOUNTER — Other Ambulatory Visit (HOSPITAL_COMMUNITY): Payer: Self-pay | Admitting: Obstetrics & Gynecology

## 2012-08-19 DIAGNOSIS — N979 Female infertility, unspecified: Secondary | ICD-10-CM

## 2012-08-26 ENCOUNTER — Ambulatory Visit (HOSPITAL_COMMUNITY)
Admission: RE | Admit: 2012-08-26 | Discharge: 2012-08-26 | Disposition: A | Discharge status: Home / Self Care | Payer: Medicare HMO | Source: Ambulatory Visit | Attending: Obstetrics & Gynecology | Admitting: Obstetrics & Gynecology

## 2012-08-26 DIAGNOSIS — N979 Female infertility, unspecified: Secondary | ICD-10-CM | POA: Insufficient documentation

## 2012-08-26 MED ORDER — IOHEXOL 300 MG/ML  SOLN
30.0000 mL | Freq: Once | INTRAMUSCULAR | Status: AC | PRN
  Administered 2012-08-26: 30 mL

## 2012-09-09 ENCOUNTER — Other Ambulatory Visit (INDEPENDENT_AMBULATORY_CARE_PROVIDER_SITE_OTHER): Payer: Medicare HMO

## 2012-09-09 DIAGNOSIS — Z0000 Encounter for general adult medical examination without abnormal findings: Secondary | ICD-10-CM

## 2012-09-09 LAB — CBC WITH DIFFERENTIAL/PLATELET
Basophils Absolute: 0.1 10*3/uL (ref 0.0–0.1)
Basophils Relative: 1.1 % (ref 0.0–3.0)
Eosinophils Absolute: 0 10*3/uL (ref 0.0–0.7)
Eosinophils Relative: 0.6 % (ref 0.0–5.0)
HCT: 37.4 % (ref 36.0–46.0)
Hemoglobin: 12.8 g/dL (ref 12.0–15.0)
Lymphocytes Relative: 33.2 % (ref 12.0–46.0)
Lymphs Abs: 1.5 10*3/uL (ref 0.7–4.0)
MCHC: 34.2 g/dL (ref 30.0–36.0)
MCV: 93.7 fl (ref 78.0–100.0)
Monocytes Absolute: 0.5 10*3/uL (ref 0.1–1.0)
Monocytes Relative: 10.1 % (ref 3.0–12.0)
Neutro Abs: 2.5 10*3/uL (ref 1.4–7.7)
Neutrophils Relative %: 55 % (ref 43.0–77.0)
Platelets: 243 10*3/uL (ref 150.0–400.0)
RBC: 4 Mil/uL (ref 3.87–5.11)
RDW: 12.6 % (ref 11.5–14.6)
WBC: 4.6 10*3/uL (ref 4.5–10.5)

## 2012-09-09 LAB — POCT URINALYSIS DIPSTICK
Bilirubin, UA: NEGATIVE
Glucose, UA: NEGATIVE
Ketones, UA: NEGATIVE
Leukocytes, UA: NEGATIVE
Nitrite, UA: NEGATIVE
Protein, UA: NEGATIVE
Spec Grav, UA: >=1.03
Urobilinogen, UA: 0.2
pH, UA: 5

## 2012-09-09 LAB — BASIC METABOLIC PANEL
BUN: 13 mg/dL (ref 6–23)
CO2: 24 mEq/L (ref 19–32)
Calcium: 8.8 mg/dL (ref 8.4–10.5)
Chloride: 104 mEq/L (ref 96–112)
Creatinine, Ser: 0.6 mg/dL (ref 0.4–1.2)
GFR: 159.3 mL/min (ref >60.00)
Glucose, Bld: 72 mg/dL (ref 70–99)
Potassium: 4.1 mEq/L (ref 3.5–5.1)
Sodium: 137 mEq/L (ref 135–145)

## 2012-09-09 LAB — HEPATIC FUNCTION PANEL
ALT: 12 U/L (ref 0–35)
AST: 16 U/L (ref 0–37)
Albumin: 4.4 g/dL (ref 3.5–5.2)
Alkaline Phosphatase: 39 U/L (ref 39–117)
Bilirubin, Direct: 0 mg/dL (ref 0.0–0.3)
Total Bilirubin: 0.5 mg/dL (ref 0.3–1.2)
Total Protein: 7.6 g/dL (ref 6.0–8.3)

## 2012-09-09 LAB — TSH: TSH: 0.49 u[IU]/mL (ref 0.35–5.50)

## 2012-09-09 LAB — LIPID PANEL
Cholesterol: 206 mg/dL — ABNORMAL HIGH (ref 0–200)
HDL: 70 mg/dL (ref >39.00)
Total CHOL/HDL Ratio: 3
Triglycerides: 43 mg/dL (ref 0.0–149.0)
VLDL: 8.6 mg/dL (ref 0.0–40.0)

## 2012-09-10 LAB — LDL CHOLESTEROL, DIRECT: Direct LDL: 138.9 mg/dL

## 2012-09-16 ENCOUNTER — Ambulatory Visit (INDEPENDENT_AMBULATORY_CARE_PROVIDER_SITE_OTHER): Payer: Managed Care, Other (non HMO) | Admitting: Internal Medicine

## 2012-09-16 ENCOUNTER — Encounter: Payer: Self-pay | Admitting: Internal Medicine

## 2012-09-16 VITALS — BP 118/80 | HR 74 | Temp 97.6°F | Ht 63.0 in | Wt 118.0 lb

## 2012-09-16 DIAGNOSIS — Z23 Encounter for immunization: Secondary | ICD-10-CM

## 2012-09-16 DIAGNOSIS — Z0000 Encounter for general adult medical examination without abnormal findings: Secondary | ICD-10-CM

## 2012-09-16 NOTE — Patient Instructions (Signed)
Continue lifestyle intervention healthy eating and exercise . Will send cop of labs to Dr Juliene Pina.  Preventive Care for Adults, Female A healthy lifestyle and preventive care can promote health and wellness. Preventive health guidelines for women include the following key practices.  A routine yearly physical is a good way to check with your caregiver about your health and preventive screening. It is a chance to share any concerns and updates on your health, and to receive a thorough exam.  Visit your dentist for a routine exam and preventive care every 6 months. Brush your teeth twice a day and floss once a day. Good oral hygiene prevents tooth decay and gum disease.  The frequency of eye exams is based on your age, health, family medical history, use of contact lenses, and other factors. Follow your caregiver's recommendations for frequency of eye exams.  Eat a healthy diet. Foods like vegetables, fruits, whole grains, low-fat dairy products, and lean protein foods contain the nutrients you need without too many calories. Decrease your intake of foods high in solid fats, added sugars, and salt. Eat the right amount of calories for you.Get information about a proper diet from your caregiver, if necessary.  Regular physical exercise is one of the most important things you can do for your health. Most adults should get at least 150 minutes of moderate-intensity exercise (any activity that increases your heart rate and causes you to sweat) each week. In addition, most adults need muscle-strengthening exercises on 2 or more days a week.  Maintain a healthy weight. The body mass index (BMI) is a screening tool to identify possible weight problems. It provides an estimate of body fat based on height and weight. Your caregiver can help determine your BMI, and can help you achieve or maintain a healthy weight.For adults 20 years and older:  A BMI below 18.5 is considered underweight.  A BMI of 18.5 to  24.9 is normal.  A BMI of 25 to 29.9 is considered overweight.  A BMI of 30 and above is considered obese.  Maintain normal blood lipids and cholesterol levels by exercising and minimizing your intake of saturated fat. Eat a balanced diet with plenty of fruit and vegetables. Blood tests for lipids and cholesterol should begin at age 63 and be repeated every 5 years. If your lipid or cholesterol levels are high, you are over 50, or you are at high risk for heart disease, you may need your cholesterol levels checked more frequently.Ongoing high lipid and cholesterol levels should be treated with medicines if diet and exercise are not effective.  If you smoke, find out from your caregiver how to quit. If you do not use tobacco, do not start.  If you are pregnant, do not drink alcohol. If you are breastfeeding, be very cautious about drinking alcohol. If you are not pregnant and choose to drink alcohol, do not exceed 1 drink per day. One drink is considered to be 12 ounces (355 mL) of beer, 5 ounces (148 mL) of wine, or 1.5 ounces (44 mL) of liquor.  Avoid use of street drugs. Do not share needles with anyone. Ask for help if you need support or instructions about stopping the use of drugs.  High blood pressure causes heart disease and increases the risk of stroke. Your blood pressure should be checked at least every 1 to 2 years. Ongoing high blood pressure should be treated with medicines if weight loss and exercise are not effective.  If you are 4  to 34 years old, ask your caregiver if you should take aspirin to prevent strokes.  Diabetes screening involves taking a blood sample to check your fasting blood sugar level. This should be done once every 3 years, after age 63, if you are within normal weight and without risk factors for diabetes. Testing should be considered at a younger age or be carried out more frequently if you are overweight and have at least 1 risk factor for diabetes.  Breast  cancer screening is essential preventive care for women. You should practice "breast self-awareness." This means understanding the normal appearance and feel of your breasts and may include breast self-examination. Any changes detected, no matter how small, should be reported to a caregiver. Women in their 42s and 30s should have a clinical breast exam (CBE) by a caregiver as part of a regular health exam every 1 to 3 years. After age 43, women should have a CBE every year. Starting at age 35, women should consider having a mammography (breast X-ray test) every year. Women who have a family history of breast cancer should talk to their caregiver about genetic screening. Women at a high risk of breast cancer should talk to their caregivers about having magnetic resonance imaging (MRI) and a mammography every year.  The Pap test is a screening test for cervical cancer. A Pap test can show cell changes on the cervix that might become cervical cancer if left untreated. A Pap test is a procedure in which cells are obtained and examined from the lower end of the uterus (cervix).  Women should have a Pap test starting at age 6.  Between ages 16 and 79, Pap tests should be repeated every 2 years.  Beginning at age 33, you should have a Pap test every 3 years as long as the past 3 Pap tests have been normal.  Some women have medical problems that increase the chance of getting cervical cancer. Talk to your caregiver about these problems. It is especially important to talk to your caregiver if a new problem develops soon after your last Pap test. In these cases, your caregiver may recommend more frequent screening and Pap tests.  The above recommendations are the same for women who have or have not gotten the vaccine for human papillomavirus (HPV).  If you had a hysterectomy for a problem that was not cancer or a condition that could lead to cancer, then you no longer need Pap tests. Even if you no longer need  a Pap test, a regular exam is a good idea to make sure no other problems are starting.  If you are between ages 18 and 56, and you have had normal Pap tests going back 10 years, you no longer need Pap tests. Even if you no longer need a Pap test, a regular exam is a good idea to make sure no other problems are starting.  If you have had past treatment for cervical cancer or a condition that could lead to cancer, you need Pap tests and screening for cancer for at least 20 years after your treatment.  If Pap tests have been discontinued, risk factors (such as a new sexual partner) need to be reassessed to determine if screening should be resumed.  The HPV test is an additional test that may be used for cervical cancer screening. The HPV test looks for the virus that can cause the cell changes on the cervix. The cells collected during the Pap test can be tested for  HPV. The HPV test could be used to screen women aged 26 years and older, and should be used in women of any age who have unclear Pap test results. After the age of 52, women should have HPV testing at the same frequency as a Pap test.  Colorectal cancer can be detected and often prevented. Most routine colorectal cancer screening begins at the age of 57 and continues through age 46. However, your caregiver may recommend screening at an earlier age if you have risk factors for colon cancer. On a yearly basis, your caregiver may provide home test kits to check for hidden blood in the stool. Use of a small camera at the end of a tube, to directly examine the colon (sigmoidoscopy or colonoscopy), can detect the earliest forms of colorectal cancer. Talk to your caregiver about this at age 5, when routine screening begins. Direct examination of the colon should be repeated every 5 to 10 years through age 78, unless early forms of pre-cancerous polyps or small growths are found.  Hepatitis C blood testing is recommended for all people born from [birth year]  through 1965 and any individual with known risks for hepatitis C.  Practice safe sex. Use condoms and avoid high-risk sexual practices to reduce the spread of sexually transmitted infections (STIs). STIs include gonorrhea, chlamydia, syphilis, trichomonas, herpes, HPV, and human immunodeficiency virus (HIV). Herpes, HIV, and HPV are viral illnesses that have no cure. They can result in disability, cancer, and death. Sexually active women aged 65 and younger should be checked for chlamydia. Older women with new or multiple partners should also be tested for chlamydia. Testing for other STIs is recommended if you are sexually active and at increased risk.  Osteoporosis is a disease in which the bones lose minerals and strength with aging. This can result in serious bone fractures. The risk of osteoporosis can be identified using a bone density scan. Women ages 80 and over and women at risk for fractures or osteoporosis should discuss screening with their caregivers. Ask your caregiver whether you should take a calcium supplement or vitamin D to reduce the rate of osteoporosis.  Menopause can be associated with physical symptoms and risks. Hormone replacement therapy is available to decrease symptoms and risks. You should talk to your caregiver about whether hormone replacement therapy is right for you.  Use sunscreen with sun protection factor (SPF) of 30 or more. Apply sunscreen liberally and repeatedly throughout the day. You should seek shade when your shadow is shorter than you. Protect yourself by wearing long sleeves, pants, a wide-brimmed hat, and sunglasses year round, whenever you are outdoors.  Once a month, do a whole body skin exam, using a mirror to look at the skin on your back. Notify your caregiver of new moles, moles that have irregular borders, moles that are larger than a pencil eraser, or moles that have changed in shape or color.  Stay current with required immunizations.  Influenza.  You need a dose every fall (or winter). The composition of the flu vaccine changes each year, so being vaccinated once is not enough.  Pneumococcal polysaccharide. You need 1 to 2 doses if you smoke cigarettes or if you have certain chronic medical conditions. You need 1 dose at age 18 (or older) if you have never been vaccinated.  Tetanus, diphtheria, pertussis (Tdap, Td). Get 1 dose of Tdap vaccine if you are younger than age 22, are over 22 and have contact with an infant, are a Research scientist (physical sciences),  are pregnant, or simply want to be protected from whooping cough. After that, you need a Td booster dose every 10 years. Consult your caregiver if you have not had at least 3 tetanus and diphtheria-containing shots sometime in your life or have a deep or dirty wound.  HPV. You need this vaccine if you are a woman age 24 or younger. The vaccine is given in 3 doses over 6 months.  Measles, mumps, rubella (MMR). You need at least 1 dose of MMR if you were born in 1957 or later. You may also need a second dose.  Meningococcal. If you are age 68 to 83 and a first-year college student living in a residence hall, or have one of several medical conditions, you need to get vaccinated against meningococcal disease. You may also need additional booster doses.  Zoster (shingles). If you are age 84 or older, you should get this vaccine.  Varicella (chickenpox). If you have never had chickenpox or you were vaccinated but received only 1 dose, talk to your caregiver to find out if you need this vaccine.  Hepatitis A. You need this vaccine if you have a specific risk factor for hepatitis A virus infection or you simply wish to be protected from this disease. The vaccine is usually given as 2 doses, 6 to 18 months apart.  Hepatitis B. You need this vaccine if you have a specific risk factor for hepatitis B virus infection or you simply wish to be protected from this disease. The vaccine is given in 3 doses, usually  over 6 months. Preventive Services / Frequency Ages 72 to 2  Blood pressure check.** / Every 1 to 2 years.  Lipid and cholesterol check.** / Every 5 years beginning at age 43.  Clinical breast exam.** / Every 3 years for women in their 95s and 30s.  Pap test.** / Every 2 years from ages 4 through 94. Every 3 years starting at age 21 through age 76 or 62 with a history of 3 consecutive normal Pap tests.  HPV screening.** / Every 3 years from ages 29 through ages 92 to 41 with a history of 3 consecutive normal Pap tests.  Hepatitis C blood test.** / For any individual with known risks for hepatitis C.  Skin self-exam. / Monthly.  Influenza immunization.** / Every year.  Pneumococcal polysaccharide immunization.** / 1 to 2 doses if you smoke cigarettes or if you have certain chronic medical conditions.  Tetanus, diphtheria, pertussis (Tdap, Td) immunization. / A one-time dose of Tdap vaccine. After that, you need a Td booster dose every 10 years.  HPV immunization. / 3 doses over 6 months, if you are 19 and younger.  Measles, mumps, rubella (MMR) immunization. / You need at least 1 dose of MMR if you were born in 1957 or later. You may also need a second dose.  Meningococcal immunization. / 1 dose if you are age 34 to 68 and a first-year college student living in a residence hall, or have one of several medical conditions, you need to get vaccinated against meningococcal disease. You may also need additional booster doses.  Varicella immunization.** / Consult your caregiver.  Hepatitis A immunization.** / Consult your caregiver. 2 doses, 6 to 18 months apart.  Hepatitis B immunization.** / Consult your caregiver. 3 doses usually over 6 months.

## 2012-09-16 NOTE — Progress Notes (Signed)
Chief Complaint  Patient presents with  . Annual Exam    No pap.    HPI: Patient comes in today for Preventive Health Care visit   No major change in health status since last visit . In fertility treatments  And stopped clomid 6 months ago . Appears to be ovulating. And then had hg  ? If tubal problem  To see Dr at [facility] .  Periods  Ok x .  Pain.  Healthy eating  Wears seat belts , safely stored  firearms , ets,. Sees dentist on a regular basis. New job   ROS:  GEN/ HEENT: No fever, significant weight changes sweats headaches vision problems hearing changes, CV/ PULM; No chest pain shortness of breath cough, syncope,edema  change in exercise tolerance. GI /GU: No adominal pain, vomiting, change in bowel habits. No blood in the stool. No significant GU symptoms. SKIN/HEME: ,no acute skin rashes suspicious lesions or bleeding. No lymphadenopathy, nodules, masses.  NEURO/ PSYCH:  No neurologic signs such as weakness numbness. No depression anxiety. IMM/ Allergy: No unusual infections.  Allergy .   REST of 12 system review negative except as per HPI   Past Medical History  Diagnosis Date  . History of chickenpox   . Headache     Family History  Problem Relation Age of Onset  . Hypertension Mother   . Diabetes Paternal Uncle   . Diabetes Maternal Grandmother   . Stroke Maternal Grandmother   . Stroke Maternal Grandfather   . Arthritis    . Osteoarthritis      History   Social History  . Marital Status: Married    Spouse Name: N/A    Number of Children: N/A  . Years of Education: N/A   Social History Main Topics  . Smoking status: Current Some Day Smoker  . Smokeless tobacco: None  . Alcohol Use: No  . Drug Use: None  . Sexually Active: None   Other Topics Concern  . 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 Multiple Vitamins-Minerals (MULTI-VITAMIN GUMMIES PO) Take by mouth.        EXAM:  BP 118/80  Pulse 74  Temp 97.6 F (36.4 C) (Oral)  Ht 5\' 3"  (1.6 m)  Wt 118 lb (53.524 kg)  BMI 20.90 kg/m2  SpO2 98%  LMP 08/16/2012  Body mass index is 20.90 kg/(m^2).  Physical Exam: Vital signs reviewed [id number] is a well-developed well-nourished alert cooperative  aa  female who appears her stated age in no acute distress.  HEENT: normocephalic atraumatic , Eyes: PERRL EOM's full, conjunctiva clear, Nares: paten,t no deformity discharge or tenderness., Ears: no deformity EAC's clear TMs with normal landmarks. Mouth: clear OP, no lesions, edema.  Moist mucous membranes. Dentition in adequate repair. NECK: supple without masses, thyromegaly or bruits  Breast: normal by inspection . No dimpling, discharge, masses, tenderness or discharge .  CHEST/PULM:  Clear to auscultation and percussion breath sounds equal no wheeze , rales or rhonchi. No chest wall deformities or tenderness. CV: PMI is nondisplaced, S1 S2 no gallops, murmurs, rubs. Peripheral pulses are full without delay.No JVD .  ABDOMEN: Bowel sounds normal nontender  No guard or rebound, no hepato  splenomegal no CVA tenderness.  No hernia. Extremtities:  No clubbing cyanosis or edema, no acute joint swelling or redness no focal atrophy NEURO:  Oriented x3, cranial nerves 3-12 appear to be intact, no obvious focal weakness,gait within normal limits no abnormal reflexes or asymmetrical SKIN: No acute rashes normal turgor, color, no bruising or petechiae. PSYCH: Oriented, good eye contact, no obvious depression anxiety, cognition and judgment appear normal. LN: no cervical axillary inguinal adenopathy  Lab Results  Component Value Date   WBC 4.6 09/09/2012   HGB 12.8 09/09/2012   HCT 37.4 09/09/2012    PLT 243.0 09/09/2012   GLUCOSE 72 09/09/2012   CHOL 206* 09/09/2012   TRIG 43.0 09/09/2012   HDL 70.00 09/09/2012   LDLDIRECT 138.9 09/09/2012   ALT 12 09/09/2012   AST 16 09/09/2012   NA 137 09/09/2012   K 4.1 09/09/2012   CL 104 09/09/2012   CREATININE 0.6 09/09/2012   BUN 13 09/09/2012   CO2 24 09/09/2012   TSH 0.49 09/09/2012    ASSESSMENT AND PLAN:  Discussed the following assessment and plan:  1. Visit for preventive health examination    make sure taking folic acid supp pre conceptually flu vaccine today   Counseled regarding healthy nutrition, exercise, sleep, injury prevention, calcium vit d and healthy weight .  Patient Care Team: Madelin Headings, MD as PCP - General Trish Mage (Dermatology) Robley Fries, MD as Attending Physician (Obstetrics and Gynecology) Patient Instructions  Continue lifestyle intervention healthy eating and exercise . Will send cop of labs to Dr Juliene Pina.  Preventive Care for Adults, Female A healthy lifestyle and preventive care can promote health and wellness. Preventive health guidelines for women include the following key practices.  A routine yearly physical is a good way to check with your caregiver about your health and preventive screening. It is a chance to share any concerns and updates on your health, and to receive a thorough exam.  Visit your dentist for a routine exam and preventive care every 6 months. Brush your teeth twice a day and floss once a day. Good oral hygiene prevents tooth decay and gum disease.  The frequency of eye exams is based on your age, health, family medical history, use of contact lenses, and other factors. Follow your caregiver's recommendations for frequency of eye exams.  Eat a healthy diet. Foods like vegetables, fruits, whole grains, low-fat dairy products, and lean protein foods contain the nutrients you need without too many calories. Decrease your intake of foods high in solid fats,  added sugars, and salt. Eat the right amount of calories for you.Get information about a proper diet from your caregiver, if necessary.  Regular physical exercise is one of the most important things you can do for your health. Most adults should get at least 150 minutes of moderate-intensity exercise (any activity that increases your heart rate and causes you to sweat) each week. In addition, most adults need muscle-strengthening exercises on 2 or more days a week.  Maintain a healthy weight. The body mass index (BMI) is a screening tool to identify possible weight problems. It provides an estimate of body fat based on height and weight. Your caregiver can help determine your BMI, and can help you achieve or maintain a healthy weight.For adults 20 years and older:  A BMI below 18.5 is considered underweight.  A BMI of 18.5 to 24.9 is normal.  A BMI of 25 to 29.9 is considered overweight.  A BMI  of 30 and above is considered obese.  Maintain normal blood lipids and cholesterol levels by exercising and minimizing your intake of saturated fat. Eat a balanced diet with plenty of fruit and vegetables. Blood tests for lipids and cholesterol should begin at age 39 and be repeated every 5 years. If your lipid or cholesterol levels are high, you are over 50, or you are at high risk for heart disease, you may need your cholesterol levels checked more frequently.Ongoing high lipid and cholesterol levels should be treated with medicines if diet and exercise are not effective.  If you smoke, find out from your caregiver how to quit. If you do not use tobacco, do not start.  If you are pregnant, do not drink alcohol. If you are breastfeeding, be very cautious about drinking alcohol. If you are not pregnant and choose to drink alcohol, do not exceed 1 drink per day. One drink is considered to be 12 ounces (355 mL) of beer, 5 ounces (148 mL) of wine, or 1.5 ounces (44 mL) of liquor.  Avoid use of street  drugs. Do not share needles with anyone. Ask for help if you need support or instructions about stopping the use of drugs.  High blood pressure causes heart disease and increases the risk of stroke. Your blood pressure should be checked at least every 1 to 2 years. Ongoing high blood pressure should be treated with medicines if weight loss and exercise are not effective.  If you are 39 to 33 years old, ask your caregiver if you should take aspirin to prevent strokes.  Diabetes screening involves taking a blood sample to check your fasting blood sugar level. This should be done once every 3 years, after age 50, if you are within normal weight and without risk factors for diabetes. Testing should be considered at a younger age or be carried out more frequently if you are overweight and have at least 1 risk factor for diabetes.  Breast cancer screening is essential preventive care for women. You should practice "breast self-awareness." This means understanding the normal appearance and feel of your breasts and may include breast self-examination. Any changes detected, no matter how small, should be reported to a caregiver. Women in their 108s and 30s should have a clinical breast exam (CBE) by a caregiver as part of a regular health exam every 1 to 3 years. After age 67, women should have a CBE every year. Starting at age 13, women should consider having a mammography (breast X-ray test) every year. Women who have a family history of breast cancer should talk to their caregiver about genetic screening. Women at a high risk of breast cancer should talk to their caregivers about having magnetic resonance imaging (MRI) and a mammography every year.  The Pap test is a screening test for cervical cancer. A Pap test can show cell changes on the cervix that might become cervical cancer if left untreated. A Pap test is a procedure in which cells are obtained and examined from the lower end of the uterus  (cervix).  Women should have a Pap test starting at age 6.  Between ages 16 and 21, Pap tests should be repeated every 2 years.  Beginning at age 20, you should have a Pap test every 3 years as long as the past 3 Pap tests have been normal.  Some women have medical problems that increase the chance of getting cervical cancer. Talk to your caregiver about these problems. It is especially important  to talk to your caregiver if a new problem develops soon after your last Pap test. In these cases, your caregiver may recommend more frequent screening and Pap tests.  The above recommendations are the same for women who have or have not gotten the vaccine for human papillomavirus (HPV).  If you had a hysterectomy for a problem that was not cancer or a condition that could lead to cancer, then you no longer need Pap tests. Even if you no longer need a Pap test, a regular exam is a good idea to make sure no other problems are starting.  If you are between ages 57 and 15, and you have had normal Pap tests going back 10 years, you no longer need Pap tests. Even if you no longer need a Pap test, a regular exam is a good idea to make sure no other problems are starting.  If you have had past treatment for cervical cancer or a condition that could lead to cancer, you need Pap tests and screening for cancer for at least 20 years after your treatment.  If Pap tests have been discontinued, risk factors (such as a new sexual partner) need to be reassessed to determine if screening should be resumed.  The HPV test is an additional test that may be used for cervical cancer screening. The HPV test looks for the virus that can cause the cell changes on the cervix. The cells collected during the Pap test can be tested for HPV. The HPV test could be used to screen women aged 67 years and older, and should be used in women of any age who have unclear Pap test results. After the age of 3, women should have HPV testing  at the same frequency as a Pap test.  Colorectal cancer can be detected and often prevented. Most routine colorectal cancer screening begins at the age of 68 and continues through age 43. However, your caregiver may recommend screening at an earlier age if you have risk factors for colon cancer. On a yearly basis, your caregiver may provide home test kits to check for hidden blood in the stool. Use of a small camera at the end of a tube, to directly examine the colon (sigmoidoscopy or colonoscopy), can detect the earliest forms of colorectal cancer. Talk to your caregiver about this at age 25, when routine screening begins. Direct examination of the colon should be repeated every 5 to 10 years through age 21, unless early forms of pre-cancerous polyps or small growths are found.  Hepatitis C blood testing is recommended for all people born from [birth year] through 1963 and any individual with known risks for hepatitis C.  Practice safe sex. Use condoms and avoid high-risk sexual practices to reduce the spread of sexually transmitted infections (STIs). STIs include gonorrhea, chlamydia, syphilis, trichomonas, herpes, HPV, and human immunodeficiency virus (HIV). Herpes, HIV, and HPV are viral illnesses that have no cure. They can result in disability, cancer, and death. Sexually active women aged 18 and younger should be checked for chlamydia. Older women with new or multiple partners should also be tested for chlamydia. Testing for other STIs is recommended if you are sexually active and at increased risk.  Osteoporosis is a disease in which the bones lose minerals and strength with aging. This can result in serious bone fractures. The risk of osteoporosis can be identified using a bone density scan. Women ages 45 and over and women at risk for fractures or osteoporosis should discuss screening with  their caregivers. Ask your caregiver whether you should take a calcium supplement or vitamin D to reduce the rate of  osteoporosis.  Menopause can be associated with physical symptoms and risks. Hormone replacement therapy is available to decrease symptoms and risks. You should talk to your caregiver about whether hormone replacement therapy is right for you.  Use sunscreen with sun protection factor (SPF) of 30 or more. Apply sunscreen liberally and repeatedly throughout the day. You should seek shade when your shadow is shorter than you. Protect yourself by wearing long sleeves, pants, a wide-brimmed hat, and sunglasses year round, whenever you are outdoors.  Once a month, do a whole body skin exam, using a mirror to look at the skin on your back. Notify your caregiver of new moles, moles that have irregular borders, moles that are larger than a pencil eraser, or moles that have changed in shape or color.  Stay current with required immunizations.  Influenza. You need a dose every fall (or winter). The composition of the flu vaccine changes each year, so being vaccinated once is not enough.  Pneumococcal polysaccharide. You need 1 to 2 doses if you smoke cigarettes or if you have certain chronic medical conditions. You need 1 dose at age 45 (or older) if you have never been vaccinated.  Tetanus, diphtheria, pertussis (Tdap, Td). Get 1 dose of Tdap vaccine if you are younger than age 44, are over 89 and have contact with an infant, are a Research scientist (physical sciences), are pregnant, or simply want to be protected from whooping cough. After that, you need a Td booster dose every 10 years. Consult your caregiver if you have not had at least 3 tetanus and diphtheria-containing shots sometime in your life or have a deep or dirty wound.  HPV. You need this vaccine if you are a woman age 14 or younger. The vaccine is given in 3 doses over 6 months.  Measles, mumps, rubella (MMR). You need at least 1 dose of MMR if you were born in 1957 or later. You may also need a second dose.  Meningococcal. If you are age 67 to 79 and a  first-year college student living in a residence hall, or have one of several medical conditions, you need to get vaccinated against meningococcal disease. You may also need additional booster doses.  Zoster (shingles). If you are age 58 or older, you should get this vaccine.  Varicella (chickenpox). If you have never had chickenpox or you were vaccinated but received only 1 dose, talk to your caregiver to find out if you need this vaccine.  Hepatitis A. You need this vaccine if you have a specific risk factor for hepatitis A virus infection or you simply wish to be protected from this disease. The vaccine is usually given as 2 doses, 6 to 18 months apart.  Hepatitis B. You need this vaccine if you have a specific risk factor for hepatitis B virus infection or you simply wish to be protected from this disease. The vaccine is given in 3 doses, usually over 6 months. Preventive Services / Frequency Ages 32 to 32  Blood pressure check.** / Every 1 to 2 years.  Lipid and cholesterol check.** / Every 5 years beginning at age 74.  Clinical breast exam.** / Every 3 years for women in their 45s and 30s.  Pap test.** / Every 2 years from ages 39 through 7. Every 3 years starting at age 38 through age 22 or 64 with a history of 3  consecutive normal Pap tests.  HPV screening.** / Every 3 years from ages 27 through ages 54 to 52 with a history of 3 consecutive normal Pap tests.  Hepatitis C blood test.** / For any individual with known risks for hepatitis C.  Skin self-exam. / Monthly.  Influenza immunization.** / Every year.  Pneumococcal polysaccharide immunization.** / 1 to 2 doses if you smoke cigarettes or if you have certain chronic medical conditions.  Tetanus, diphtheria, pertussis (Tdap, Td) immunization. / A one-time dose of Tdap vaccine. After that, you need a Td booster dose every 10 years.  HPV immunization. / 3 doses over 6 months, if you are 63 and younger.  Measles, mumps,  rubella (MMR) immunization. / You need at least 1 dose of MMR if you were born in 1957 or later. You may also need a second dose.  Meningococcal immunization. / 1 dose if you are age 58 to 8 and a first-year college student living in a residence hall, or have one of several medical conditions, you need to get vaccinated against meningococcal disease. You may also need additional booster doses.  Varicella immunization.** / Consult your caregiver.  Hepatitis A immunization.** / Consult your caregiver. 2 doses, 6 to 18 months apart.  Hepatitis B immunization.** / Consult your caregiver. 3 doses usually over 6 months.    Neta Mends. Tanis Burnley M.D.

## 2013-09-09 ENCOUNTER — Other Ambulatory Visit (INDEPENDENT_AMBULATORY_CARE_PROVIDER_SITE_OTHER): Payer: BC Managed Care – PPO

## 2013-09-09 DIAGNOSIS — Z0000 Encounter for general adult medical examination without abnormal findings: Secondary | ICD-10-CM

## 2013-09-09 LAB — HEPATIC FUNCTION PANEL
ALT: 13 U/L (ref 0–35)
AST: 18 U/L (ref 0–37)
Albumin: 4.6 g/dL (ref 3.5–5.2)
Alkaline Phosphatase: 44 U/L (ref 39–117)
Bilirubin, Direct: 0.1 mg/dL (ref 0.0–0.3)
Total Bilirubin: 0.8 mg/dL (ref 0.3–1.2)
Total Protein: 7.4 g/dL (ref 6.0–8.3)

## 2013-09-09 LAB — LIPID PANEL
Cholesterol: 208 mg/dL — ABNORMAL HIGH (ref 0–200)
HDL: 71.3 mg/dL (ref >39.00)
Total CHOL/HDL Ratio: 3
Triglycerides: 51 mg/dL (ref 0.0–149.0)
VLDL: 10.2 mg/dL (ref 0.0–40.0)

## 2013-09-09 LAB — BASIC METABOLIC PANEL
BUN: 14 mg/dL (ref 6–23)
CO2: 25 mEq/L (ref 19–32)
Calcium: 9.5 mg/dL (ref 8.4–10.5)
Chloride: 106 mEq/L (ref 96–112)
Creatinine, Ser: 0.5 mg/dL (ref 0.4–1.2)
GFR: 168.75 mL/min (ref >60.00)
Glucose, Bld: 86 mg/dL (ref 70–99)
Potassium: 4.4 mEq/L (ref 3.5–5.1)
Sodium: 137 mEq/L (ref 135–145)

## 2013-09-09 LAB — TSH: TSH: 0.65 u[IU]/mL (ref 0.35–5.50)

## 2013-09-09 LAB — CBC WITH DIFFERENTIAL/PLATELET
Basophils Absolute: 0 10*3/uL (ref 0.0–0.1)
Basophils Relative: 0.5 % (ref 0.0–3.0)
Eosinophils Absolute: 0 10*3/uL (ref 0.0–0.7)
Eosinophils Relative: 0.7 % (ref 0.0–5.0)
HCT: 37.9 % (ref 36.0–46.0)
Hemoglobin: 12.9 g/dL (ref 12.0–15.0)
Lymphocytes Relative: 39.7 % (ref 12.0–46.0)
Lymphs Abs: 1.5 10*3/uL (ref 0.7–4.0)
MCHC: 34.1 g/dL (ref 30.0–36.0)
MCV: 93 fl (ref 78.0–100.0)
Monocytes Absolute: 0.4 10*3/uL (ref 0.1–1.0)
Monocytes Relative: 9.9 % (ref 3.0–12.0)
Neutro Abs: 1.8 10*3/uL (ref 1.4–7.7)
Neutrophils Relative %: 49.2 % (ref 43.0–77.0)
Platelets: 263 10*3/uL (ref 150.0–400.0)
RBC: 4.08 Mil/uL (ref 3.87–5.11)
RDW: 12.1 % (ref 11.5–14.6)
WBC: 3.7 10*3/uL — ABNORMAL LOW (ref 4.5–10.5)

## 2013-09-10 LAB — LDL CHOLESTEROL, DIRECT: Direct LDL: 131.6 mg/dL

## 2013-09-16 ENCOUNTER — Encounter: Payer: BC Managed Care – PPO | Admitting: Internal Medicine

## 2013-09-16 ENCOUNTER — Encounter: Payer: Self-pay | Admitting: Internal Medicine

## 2013-09-16 NOTE — Progress Notes (Signed)
Lab results given to patient and reviewed . discussed at sons visit .

## 2013-09-23 ENCOUNTER — Encounter: Payer: Self-pay | Admitting: Internal Medicine

## 2013-09-23 ENCOUNTER — Ambulatory Visit (INDEPENDENT_AMBULATORY_CARE_PROVIDER_SITE_OTHER): Payer: BC Managed Care – PPO | Admitting: Internal Medicine

## 2013-09-23 VITALS — BP 110/78 | HR 77 | Temp 98.9°F | Ht 62.75 in | Wt 117.0 lb

## 2013-09-23 DIAGNOSIS — Z0000 Encounter for general adult medical examination without abnormal findings: Secondary | ICD-10-CM

## 2013-09-23 NOTE — Patient Instructions (Signed)
Continue lifestyle intervention healthy eating and exercise .   Preventive Care for Adults, Female A healthy lifestyle and preventive care can promote health and wellness. Preventive health guidelines for women include the following key practices.  A routine yearly physical is a good way to check with your caregiver about your health and preventive screening. It is a chance to share any concerns and updates on your health, and to receive a thorough exam.  Visit your dentist for a routine exam and preventive care every 6 months. Brush your teeth twice a day and floss once a day. Good oral hygiene prevents tooth decay and gum disease.  The frequency of eye exams is based on your age, health, family medical history, use of contact lenses, and other factors. Follow your caregiver's recommendations for frequency of eye exams.  Eat a healthy diet. Foods like vegetables, fruits, whole grains, low-fat dairy products, and lean protein foods contain the nutrients you need without too many calories. Decrease your intake of foods high in solid fats, added sugars, and salt. Eat the right amount of calories for you.Get information about a proper diet from your caregiver, if necessary.  Regular physical exercise is one of the most important things you can do for your health. Most adults should get at least 150 minutes of moderate-intensity exercise (any activity that increases your heart rate and causes you to sweat) each week. In addition, most adults need muscle-strengthening exercises on 2 or more days a week.  Maintain a healthy weight. The body mass index (BMI) is a screening tool to identify possible weight problems. It provides an estimate of body fat based on height and weight. Your caregiver can help determine your BMI, and can help you achieve or maintain a healthy weight.For adults 20 years and older:  A BMI below 18.5 is considered underweight.  A BMI of 18.5 to 24.9 is normal.  A BMI of 25 to  29.9 is considered overweight.  A BMI of 30 and above is considered obese.  Maintain normal blood lipids and cholesterol levels by exercising and minimizing your intake of saturated fat. Eat a balanced diet with plenty of fruit and vegetables. Blood tests for lipids and cholesterol should begin at age 76 and be repeated every 5 years. If your lipid or cholesterol levels are high, you are over 50, or you are at high risk for heart disease, you may need your cholesterol levels checked more frequently.Ongoing high lipid and cholesterol levels should be treated with medicines if diet and exercise are not effective.  If you smoke, find out from your caregiver how to quit. If you do not use tobacco, do not start.  Lung cancer screening is recommended for adults aged 11 80 years who are at high risk for developing lung cancer because of a history of smoking. Yearly low-dose computed tomography (CT) is recommended for people who have at least a 30-pack-year history of smoking and are a current smoker or have quit within the past 15 years. A pack year of smoking is smoking an average of 1 pack of cigarettes a day for 1 year (for example: 1 pack a day for 30 years or 2 packs a day for 15 years). Yearly screening should continue until the smoker has stopped smoking for at least 15 years. Yearly screening should also be stopped for people who develop a health problem that would prevent them from having lung cancer treatment.  If you are pregnant, do not drink alcohol. If you are  breastfeeding, be very cautious about drinking alcohol. If you are not pregnant and choose to drink alcohol, do not exceed 1 drink per day. One drink is considered to be 12 ounces (355 mL) of beer, 5 ounces (148 mL) of wine, or 1.5 ounces (44 mL) of liquor.  Avoid use of street drugs. Do not share needles with anyone. Ask for help if you need support or instructions about stopping the use of drugs.  High blood pressure causes heart  disease and increases the risk of stroke. Your blood pressure should be checked at least every 1 to 2 years. Ongoing high blood pressure should be treated with medicines if weight loss and exercise are not effective.  If you are 14 to 35 years old, ask your caregiver if you should take aspirin to prevent strokes.  Diabetes screening involves taking a blood sample to check your fasting blood sugar level. This should be done once every 3 years, after age 56, if you are within normal weight and without risk factors for diabetes. Testing should be considered at a younger age or be carried out more frequently if you are overweight and have at least 1 risk factor for diabetes.  Breast cancer screening is essential preventive care for women. You should practice "breast self-awareness." This means understanding the normal appearance and feel of your breasts and may include breast self-examination. Any changes detected, no matter how small, should be reported to a caregiver. Women in their 55s and 30s should have a clinical breast exam (CBE) by a caregiver as part of a regular health exam every 1 to 3 years. After age 79, women should have a CBE every year. Starting at age 36, women should consider having a mammography (breast X-ray test) every year. Women who have a family history of breast cancer should talk to their caregiver about genetic screening. Women at a high risk of breast cancer should talk to their caregivers about having magnetic resonance imaging (MRI) and a mammography every year.  Breast cancer gene (BRCA)-related cancer risk assessment is recommended for women who have family members with BRCA-related cancers. BRCA-related cancers include breast, ovarian, tubal, and peritoneal cancers. Having family members with these cancers may be associated with an increased risk for harmful changes (mutations) in the breast cancer genes BRCA1 and BRCA2. Results of the assessment will determine the need for  genetic counseling and BRCA1 and BRCA2 testing.  The Pap test is a screening test for cervical cancer. A Pap test can show cell changes on the cervix that might become cervical cancer if left untreated. A Pap test is a procedure in which cells are obtained and examined from the lower end of the uterus (cervix).  Women should have a Pap test starting at age 4.  Between ages 16 and 60, Pap tests should be repeated every 2 years.  Beginning at age 92, you should have a Pap test every 3 years as long as the past 3 Pap tests have been normal.  Some women have medical problems that increase the chance of getting cervical cancer. Talk to your caregiver about these problems. It is especially important to talk to your caregiver if a new problem develops soon after your last Pap test. In these cases, your caregiver may recommend more frequent screening and Pap tests.  The above recommendations are the same for women who have or have not gotten the vaccine for human papillomavirus (HPV).  If you had a hysterectomy for a problem that was not  cancer or a condition that could lead to cancer, then you no longer need Pap tests. Even if you no longer need a Pap test, a regular exam is a good idea to make sure no other problems are starting.  If you are between ages 77 and 85, and you have had normal Pap tests going back 10 years, you no longer need Pap tests. Even if you no longer need a Pap test, a regular exam is a good idea to make sure no other problems are starting.  If you have had past treatment for cervical cancer or a condition that could lead to cancer, you need Pap tests and screening for cancer for at least 20 years after your treatment.  If Pap tests have been discontinued, risk factors (such as a new sexual partner) need to be reassessed to determine if screening should be resumed.  The HPV test is an additional test that may be used for cervical cancer screening. The HPV test looks for the virus  that can cause the cell changes on the cervix. The cells collected during the Pap test can be tested for HPV. The HPV test could be used to screen women aged 75 years and older, and should be used in women of any age who have unclear Pap test results. After the age of 15, women should have HPV testing at the same frequency as a Pap test.  Colorectal cancer can be detected and often prevented. Most routine colorectal cancer screening begins at the age of 64 and continues through age 46. However, your caregiver may recommend screening at an earlier age if you have risk factors for colon cancer. On a yearly basis, your caregiver may provide home test kits to check for hidden blood in the stool. Use of a small camera at the end of a tube, to directly examine the colon (sigmoidoscopy or colonoscopy), can detect the earliest forms of colorectal cancer. Talk to your caregiver about this at age 59, when routine screening begins. Direct examination of the colon should be repeated every 5 to 10 years through age 8, unless early forms of pre-cancerous polyps or small growths are found.  Hepatitis C blood testing is recommended for all people born from [birth year] through 1963 and any individual with known risks for hepatitis C.  Practice safe sex. Use condoms and avoid high-risk sexual practices to reduce the spread of sexually transmitted infections (STIs). STIs include gonorrhea, chlamydia, syphilis, trichomonas, herpes, HPV, and human immunodeficiency virus (HIV). Herpes, HIV, and HPV are viral illnesses that have no cure. They can result in disability, cancer, and death. Sexually active women aged 58 and younger should be checked for chlamydia. Older women with new or multiple partners should also be tested for chlamydia. Testing for other STIs is recommended if you are sexually active and at increased risk.  Osteoporosis is a disease in which the bones lose minerals and strength with aging. This can result in serious  bone fractures. The risk of osteoporosis can be identified using a bone density scan. Women ages 24 and over and women at risk for fractures or osteoporosis should discuss screening with their caregivers. Ask your caregiver whether you should take a calcium supplement or vitamin D to reduce the rate of osteoporosis.  Menopause can be associated with physical symptoms and risks. Hormone replacement therapy is available to decrease symptoms and risks. You should talk to your caregiver about whether hormone replacement therapy is right for you.  Use sunscreen. Apply sunscreen  liberally and repeatedly throughout the day. You should seek shade when your shadow is shorter than you. Protect yourself by wearing long sleeves, pants, a wide-brimmed hat, and sunglasses year round, whenever you are outdoors.  Once a month, do a whole body skin exam, using a mirror to look at the skin on your back. Notify your caregiver of new moles, moles that have irregular borders, moles that are larger than a pencil eraser, or moles that have changed in shape or color.  Stay current with required immunizations.  Influenza vaccine. All adults should be immunized every year.  Tetanus, diphtheria, and acellular pertussis (Td, Tdap) vaccine. Pregnant women should receive 1 dose of Tdap vaccine during each pregnancy. The dose should be obtained regardless of the length of time since the last dose. Immunization is preferred during the 27th to 36th week of gestation. An adult who has not previously received Tdap or who does not know her vaccine status should receive 1 dose of Tdap. This initial dose should be followed by tetanus and diphtheria toxoids (Td) booster doses every 10 years. Adults with an unknown or incomplete history of completing a 3-dose immunization series with Td-containing vaccines should begin or complete a primary immunization series including a Tdap dose. Adults should receive a Td booster every 10  years.  Varicella vaccine. An adult without evidence of immunity to varicella should receive 2 doses or a second dose if she has previously received 1 dose. Pregnant females who do not have evidence of immunity should receive the first dose after pregnancy. This first dose should be obtained before leaving the health care facility. The second dose should be obtained 4 8 weeks after the first dose.  Human papillomavirus (HPV) vaccine. Females aged 66 26 years who have not received the vaccine previously should obtain the 3-dose series. The vaccine is not recommended for use in pregnant females. However, pregnancy testing is not needed before receiving a dose. If a female is found to be pregnant after receiving a dose, no treatment is needed. In that case, the remaining doses should be delayed until after the pregnancy. Immunization is recommended for any person with an immunocompromised condition through the age of 26 years if she did not get any or all doses earlier. During the 3-dose series, the second dose should be obtained 4 8 weeks after the first dose. The third dose should be obtained 24 weeks after the first dose and 16 weeks after the second dose.  Zoster vaccine. One dose is recommended for adults aged 15 years or older unless certain conditions are present.  Measles, mumps, and rubella (MMR) vaccine. Adults born before 24 generally are considered immune to measles and mumps. Adults born in [birth year] or later should have 1 or more doses of MMR vaccine unless there is a contraindication to the vaccine or there is laboratory evidence of immunity to each of the three diseases. A routine second dose of MMR vaccine should be obtained at least 28 days after the first dose for students attending postsecondary schools, health care workers, or international travelers. People who received inactivated measles vaccine or an unknown type of measles vaccine during 1963 1967 should receive 2 doses of MMR vaccine.  People who received inactivated mumps vaccine or an unknown type of mumps vaccine before 1979 and are at high risk for mumps infection should consider immunization with 2 doses of MMR vaccine. For females of childbearing age, rubella immunity should be determined. If there is no evidence of immunity,  females who are not pregnant should be vaccinated. If there is no evidence of immunity, females who are pregnant should delay immunization until after pregnancy. Unvaccinated health care workers born before 25 who lack laboratory evidence of measles, mumps, or rubella immunity or laboratory confirmation of disease should consider measles and mumps immunization with 2 doses of MMR vaccine or rubella immunization with 1 dose of MMR vaccine.  Pneumococcal 13-valent conjugate (PCV13) vaccine. When indicated, a person who is uncertain of her immunization history and has no record of immunization should receive the PCV13 vaccine. An adult aged 72 years or older who has certain medical conditions and has not been previously immunized should receive 1 dose of PCV13 vaccine. This PCV13 should be followed with a dose of pneumococcal polysaccharide (PPSV23) vaccine. The PPSV23 vaccine dose should be obtained at least 8 weeks after the dose of PCV13 vaccine. An adult aged 32 years or older who has certain medical conditions and previously received 1 or more doses of PPSV23 vaccine should receive 1 dose of PCV13. The PCV13 vaccine dose should be obtained 1 or more years after the last PPSV23 vaccine dose.  Pneumococcal polysaccharide (PPSV23) vaccine. When PCV13 is also indicated, PCV13 should be obtained first. All adults aged 2 years and older should be immunized. An adult younger than age 12 years who has certain medical conditions should be immunized. Any person who resides in a nursing home or long-term care facility should be immunized. An adult smoker should be immunized. People with an immunocompromised condition and  certain other conditions should receive both PCV13 and PPSV23 vaccines. People with human immunodeficiency virus (HIV) infection should be immunized as soon as possible after diagnosis. Immunization during chemotherapy or radiation therapy should be avoided. Routine use of PPSV23 vaccine is not recommended for American Indians, [address], or people younger than 65 years unless there are medical conditions that require PPSV23 vaccine. When indicated, people who have unknown immunization and have no record of immunization should receive PPSV23 vaccine. One-time revaccination 5 years after the first dose of PPSV23 is recommended for people aged 39 64 years who have chronic kidney failure, nephrotic syndrome, asplenia, or immunocompromised conditions. People who received 1 2 doses of PPSV23 before age 45 years should receive another dose of PPSV23 vaccine at age 61 years or later if at least 5 years have passed since the previous dose. Doses of PPSV23 are not needed for people immunized with PPSV23 at or after age 61 years.  Meningococcal vaccine. Adults with asplenia or persistent complement component deficiencies should receive 2 doses of quadrivalent meningococcal conjugate (MenACWY-D) vaccine. The doses should be obtained at least 2 months apart. Microbiologists working with certain meningococcal bacteria, military recruits, people at risk during an outbreak, and people who travel to or live in countries with a high rate of meningitis should be immunized. A first-year college student up through age 12 years who is living in a residence hall should receive a dose if she did not receive a dose on or after her 16th birthday. Adults who have certain high-risk conditions should receive one or more doses of vaccine.  Hepatitis A vaccine. Adults who wish to be protected from this disease, have certain high-risk conditions, work with hepatitis A-infected animals, work in hepatitis A research labs, or travel to or  work in countries with a high rate of hepatitis A should be immunized. Adults who were previously unvaccinated and who anticipate close contact with an international adoptee during the first 52  days after arrival in the [place] from a country with a high rate of hepatitis A should be immunized.  Hepatitis B vaccine. Adults who wish to be protected from this disease, have certain high-risk conditions, may be exposed to blood or other infectious body fluids, are household contacts or sex partners of hepatitis B positive people, are clients or workers in certain care facilities, or travel to or work in countries with a high rate of hepatitis B should be immunized.  Haemophilus influenzae type b (Hib) vaccine. A previously unvaccinated person with asplenia or sickle cell disease or having a scheduled splenectomy should receive 1 dose of Hib vaccine. Regardless of previous immunization, a recipient of a hematopoietic stem cell transplant should receive a 3-dose series 6 12 months after her successful transplant. Hib vaccine is not recommended for adults with HIV infection. Preventive Services / Frequency Ages 4 to 14  Blood pressure check.** / Every 1 to 2 years.  Lipid and cholesterol check.** / Every 5 years beginning at age 21.  Clinical breast exam.** / Every 3 years for women in their 68s and 30s.  BRCA-related cancer risk assessment.** / For women who have family members with a BRCA-related cancer (breast, ovarian, tubal, or peritoneal cancers).  Pap test.** / Every 2 years from ages 13 through 78. Every 3 years starting at age 19 through age 75 or 6 with a history of 3 consecutive normal Pap tests.  HPV screening.** / Every 3 years from ages 2 through ages 26 to 27 with a history of 3 consecutive normal Pap tests.  Hepatitis C blood test.** / For any individual with known risks for hepatitis C.  Skin self-exam. / Monthly.  Influenza vaccine. / Every year.  Tetanus, diphtheria,  and acellular pertussis (Tdap, Td) vaccine.** / Consult your caregiver. Pregnant women should receive 1 dose of Tdap vaccine during each pregnancy. 1 dose of Td every 10 years.  Varicella vaccine.** / Consult your caregiver. Pregnant females who do not have evidence of immunity should receive the first dose after pregnancy.  HPV vaccine. / 3 doses over 6 months, if 26 and younger. The vaccine is not recommended for use in pregnant females. However, pregnancy testing is not needed before receiving a dose.  Measles, mumps, rubella (MMR) vaccine.** / You need at least 1 dose of MMR if you were born in 1957 or later. You may also need a 2nd dose. For females of childbearing age, rubella immunity should be determined. If there is no evidence of immunity, females who are not pregnant should be vaccinated. If there is no evidence of immunity, females who are pregnant should delay immunization until after pregnancy.  Pneumococcal 13-valent conjugate (PCV13) vaccine.** / Consult your caregiver.  Pneumococcal polysaccharide (PPSV23) vaccine.** / 1 to 2 doses if you smoke cigarettes or if you have certain conditions.  Meningococcal vaccine.** / 1 dose if you are age 31 to 89 years and a Orthoptist living in a residence hall, or have one of several medical conditions, you need to get vaccinated against meningococcal disease. You may also need additional booster doses.  Hepatitis A vaccine.** / Consult your caregiver.  Hepatitis B vaccine.** / Consult your caregiver.  Haemophilus influenzae type b (Hib) vaccine.** / Consult your caregiver.

## 2013-09-23 NOTE — Progress Notes (Signed)
Pre visit review using our clinic review tool, if applicable. No additional management support is needed unless otherwise documented below in the visit note. Chief Complaint  Patient presents with  . Annual Exam    HPI: Patient comes in today for Preventive Health Care visit  Has had  Fertility eval and tube  Mucous plug. Removal  MAY.    Has  Period every month . Clomid for a year.  Dr Laural Benes and now on femara.  No success since may . Stopped tobacco exercising eating healthy . Periods about once a month .  Working [employer] . Day.  hh of 3  ROS:  GEN/ HEENT: No fever, significant weight changes sweats headaches vision problems hearing changes, CV/ PULM; No chest pain shortness of breath cough, syncope,edema  change in exercise tolerance. GI /GU: No adominal pain, vomiting, change in bowel habits. No blood in the stool. No significant GU symptoms. SKIN/HEME: ,no acute skin rashes suspicious lesions or bleeding. No lymphadenopathy, nodules, masses.  NEURO/ PSYCH:  No neurologic signs such as weakness numbness. No depression anxiety. IMM/ Allergy: No unusual infections.  Allergy .   REST of 12 system review negative except as per HPI   Past Medical History  Diagnosis Date  . History of chickenpox   . Headache(784.0)     Family History  Problem Relation Age of Onset  . Hypertension Mother   . Diabetes Paternal Uncle   . Diabetes Maternal Grandmother   . Stroke Maternal Grandmother   . Stroke Maternal Grandfather   . Arthritis    . Osteoarthritis     Son undergoing eval for headaches   History   Social History  . Marital Status: Married    Spouse Name: N/A    Number of Children: N/A  . Years of Education: N/A   Social History Main Topics  . Smoking status: Former Games developer  . Smokeless tobacco: None     Comment: Stopped 1 year ago  . Alcohol Use: No  . Drug Use: None  . Sexual Activity: None   Other Topics Concern  . 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 Prenatal Vit-Fe Fumarate-FA (PRENATAL PO) Take by mouth.  . [DISCONTINUED] Multiple Vitamins-Minerals (MULTI-VITAMIN GUMMIES PO) Take by mouth.    EXAM:  BP 110/78  Pulse 77  Temp(Src) 98.9 F (37.2 C) (Oral)  Ht 5' 2.75" (1.594 m)  Wt 117 lb (53.071 kg)  BMI 20.89 kg/m2  SpO2 98%  LMP 09/21/2013  Body mass index is 20.89 kg/(m^2).  Physical Exam: Vital signs reviewed NFA:OZHY is a well-developed well-nourished alert cooperative   female who appears her stated age in no acute distress.  HEENT: normocephalic atraumatic , Eyes: PERRL EOM's full, conjunctiva clear, Nares: paten,t no deformity discharge or tenderness., Ears: no deformity EAC's clear TMs with normal landmarks. Mouth: clear OP, no lesions, edema.  Moist mucous membranes. Dentition in adequate repair. NECK: supple without masses, thyromegaly or bruits. CHEST/PULM:  Clear to auscultation and percussion breath sounds equal no wheeze , rales or  rhonchi. No chest wall deformities or tenderness. CV: PMI is nondisplaced, S1 S2 no gallops, murmurs, rubs. Peripheral pulses are full without delay.No JVD .  ABDOMEN: Bowel sounds normal nontender  No guard or rebound, no hepato splenomegal no CVA tenderness.  No hernia. Extremtities:  No clubbing cyanosis or edema, no acute joint swelling or redness no focal atrophy NEURO:  Oriented x3, cranial nerves 3-12 appear to be intact, no obvious focal weakness,gait within normal limits no abnormal reflexes or asymmetrical SKIN: No acute rashes normal turgor, color, no bruising or petechiae. PSYCH: Oriented, good eye contact, no obvious depression anxiety, cognition and judgment  appear normal. LN: no cervical axillary inguinal adenopathy  Lab Results  Component Value Date   WBC 3.7* 09/09/2013   HGB 12.9 09/09/2013   HCT 37.9 09/09/2013   PLT 263.0 09/09/2013   GLUCOSE 86 09/09/2013   CHOL 208* 09/09/2013   TRIG 51.0 09/09/2013   HDL 71.30 09/09/2013   LDLDIRECT 131.6 09/09/2013   ALT 13 09/09/2013   AST 18 09/09/2013   NA 137 09/09/2013   K 4.4 09/09/2013   CL 106 09/09/2013   CREATININE 0.5 09/09/2013   BUN 14 09/09/2013   CO2 25 09/09/2013   TSH 0.65 09/09/2013    ASSESSMENT AND PLAN:  Discussed the following assessment and plan:  Visit for preventive health examination - utd HCP Counseled regarding healthy nutrition, exercise, sleep, injury prevention, calcium vit d and healthy weight . Continue tobacco free  .  Cont folic acid prenatals as per specialists . Patient Care Team: Madelin Headings, MD as PCP - General Trish Mage (Dermatology) Robley Fries, MD as Attending Physician (Obstetrics and Gynecology) Rushie Nyhan, MD as Consulting Physician (Obstetrics and Gynecology) Patient Instructions  Continue lifestyle intervention healthy eating and exercise .   Preventive Care for Adults, Female A healthy lifestyle and preventive care can promote health and wellness. Preventive health guidelines for women include the following key practices.  A routine yearly physical is a good way to check with your caregiver about your health and preventive screening. It is a chance to share any concerns and updates on your health, and to receive a thorough exam.  Visit your dentist for a routine exam and preventive care every 6 months. Brush your teeth twice a day and floss once a day. Good oral hygiene prevents tooth decay and gum disease.  The frequency of eye exams is based on your age, health, family medical history, use of contact lenses, and other factors. Follow your caregiver's recommendations for frequency of eye exams.  Eat  a healthy diet. Foods like vegetables, fruits, whole grains, low-fat dairy products, and lean protein foods contain the nutrients you need without too many calories. Decrease your intake of foods high in solid fats, added sugars, and salt. Eat the right amount of calories for you.Get information about a proper diet from your caregiver, if necessary.  Regular physical exercise is one of the most important things you can do for your health. Most adults should get at least 150 minutes of moderate-intensity exercise (any activity that increases your heart rate and causes you to sweat) each week. In addition, most adults need muscle-strengthening exercises on 2 or more days a week.  Maintain a healthy weight. The body mass index (BMI) is a screening tool to identify possible weight problems. It provides an estimate of body fat based on height and weight. Your caregiver can help determine your BMI, and can help you achieve or  maintain a healthy weight.For adults 20 years and older:  A BMI below 18.5 is considered underweight.  A BMI of 18.5 to 24.9 is normal.  A BMI of 25 to 29.9 is considered overweight.  A BMI of 30 and above is considered obese.  Maintain normal blood lipids and cholesterol levels by exercising and minimizing your intake of saturated fat. Eat a balanced diet with plenty of fruit and vegetables. Blood tests for lipids and cholesterol should begin at age 40 and be repeated every 5 years. If your lipid or cholesterol levels are high, you are over 50, or you are at high risk for heart disease, you may need your cholesterol levels checked more frequently.Ongoing high lipid and cholesterol levels should be treated with medicines if diet and exercise are not effective.  If you smoke, find out from your caregiver how to quit. If you do not use tobacco, do not start.  Lung cancer screening is recommended for adults aged 20 80 years who are at high risk for developing lung cancer because of  a history of smoking. Yearly low-dose computed tomography (CT) is recommended for people who have at least a 30-pack-year history of smoking and are a current smoker or have quit within the past 15 years. A pack year of smoking is smoking an average of 1 pack of cigarettes a day for 1 year (for example: 1 pack a day for 30 years or 2 packs a day for 15 years). Yearly screening should continue until the smoker has stopped smoking for at least 15 years. Yearly screening should also be stopped for people who develop a health problem that would prevent them from having lung cancer treatment.  If you are pregnant, do not drink alcohol. If you are breastfeeding, be very cautious about drinking alcohol. If you are not pregnant and choose to drink alcohol, do not exceed 1 drink per day. One drink is considered to be 12 ounces (355 mL) of beer, 5 ounces (148 mL) of wine, or 1.5 ounces (44 mL) of liquor.  Avoid use of street drugs. Do not share needles with anyone. Ask for help if you need support or instructions about stopping the use of drugs.  High blood pressure causes heart disease and increases the risk of stroke. Your blood pressure should be checked at least every 1 to 2 years. Ongoing high blood pressure should be treated with medicines if weight loss and exercise are not effective.  If you are 107 to 35 years old, ask your caregiver if you should take aspirin to prevent strokes.  Diabetes screening involves taking a blood sample to check your fasting blood sugar level. This should be done once every 3 years, after age 25, if you are within normal weight and without risk factors for diabetes. Testing should be considered at a younger age or be carried out more frequently if you are overweight and have at least 1 risk factor for diabetes.  Breast cancer screening is essential preventive care for women. You should practice "breast self-awareness." This means understanding the normal appearance and feel of  your breasts and may include breast self-examination. Any changes detected, no matter how small, should be reported to a caregiver. Women in their 73s and 30s should have a clinical breast exam (CBE) by a caregiver as part of a regular health exam every 1 to 3 years. After age 25, women should have a CBE every year. Starting at age 6, women should consider having a mammography (breast  X-ray test) every year. Women who have a family history of breast cancer should talk to their caregiver about genetic screening. Women at a high risk of breast cancer should talk to their caregivers about having magnetic resonance imaging (MRI) and a mammography every year.  Breast cancer gene (BRCA)-related cancer risk assessment is recommended for women who have family members with BRCA-related cancers. BRCA-related cancers include breast, ovarian, tubal, and peritoneal cancers. Having family members with these cancers may be associated with an increased risk for harmful changes (mutations) in the breast cancer genes BRCA1 and BRCA2. Results of the assessment will determine the need for genetic counseling and BRCA1 and BRCA2 testing.  The Pap test is a screening test for cervical cancer. A Pap test can show cell changes on the cervix that might become cervical cancer if left untreated. A Pap test is a procedure in which cells are obtained and examined from the lower end of the uterus (cervix).  Women should have a Pap test starting at age 7.  Between ages 52 and 21, Pap tests should be repeated every 2 years.  Beginning at age 51, you should have a Pap test every 3 years as long as the past 3 Pap tests have been normal.  Some women have medical problems that increase the chance of getting cervical cancer. Talk to your caregiver about these problems. It is especially important to talk to your caregiver if a new problem develops soon after your last Pap test. In these cases, your caregiver may recommend more frequent  screening and Pap tests.  The above recommendations are the same for women who have or have not gotten the vaccine for human papillomavirus (HPV).  If you had a hysterectomy for a problem that was not cancer or a condition that could lead to cancer, then you no longer need Pap tests. Even if you no longer need a Pap test, a regular exam is a good idea to make sure no other problems are starting.  If you are between ages 1 and 87, and you have had normal Pap tests going back 10 years, you no longer need Pap tests. Even if you no longer need a Pap test, a regular exam is a good idea to make sure no other problems are starting.  If you have had past treatment for cervical cancer or a condition that could lead to cancer, you need Pap tests and screening for cancer for at least 20 years after your treatment.  If Pap tests have been discontinued, risk factors (such as a new sexual partner) need to be reassessed to determine if screening should be resumed.  The HPV test is an additional test that may be used for cervical cancer screening. The HPV test looks for the virus that can cause the cell changes on the cervix. The cells collected during the Pap test can be tested for HPV. The HPV test could be used to screen women aged 62 years and older, and should be used in women of any age who have unclear Pap test results. After the age of 58, women should have HPV testing at the same frequency as a Pap test.  Colorectal cancer can be detected and often prevented. Most routine colorectal cancer screening begins at the age of 67 and continues through age 73. However, your caregiver may recommend screening at an earlier age if you have risk factors for colon cancer. On a yearly basis, your caregiver may provide home test kits to check for  hidden blood in the stool. Use of a small camera at the end of a tube, to directly examine the colon (sigmoidoscopy or colonoscopy), can detect the earliest forms of colorectal  cancer. Talk to your caregiver about this at age 14, when routine screening begins. Direct examination of the colon should be repeated every 5 to 10 years through age 66, unless early forms of pre-cancerous polyps or small growths are found.  Hepatitis C blood testing is recommended for all people born from [birth year] through 1963 and any individual with known risks for hepatitis C.  Practice safe sex. Use condoms and avoid high-risk sexual practices to reduce the spread of sexually transmitted infections (STIs). STIs include gonorrhea, chlamydia, syphilis, trichomonas, herpes, HPV, and human immunodeficiency virus (HIV). Herpes, HIV, and HPV are viral illnesses that have no cure. They can result in disability, cancer, and death. Sexually active women aged 31 and younger should be checked for chlamydia. Older women with new or multiple partners should also be tested for chlamydia. Testing for other STIs is recommended if you are sexually active and at increased risk.  Osteoporosis is a disease in which the bones lose minerals and strength with aging. This can result in serious bone fractures. The risk of osteoporosis can be identified using a bone density scan. Women ages 33 and over and women at risk for fractures or osteoporosis should discuss screening with their caregivers. Ask your caregiver whether you should take a calcium supplement or vitamin D to reduce the rate of osteoporosis.  Menopause can be associated with physical symptoms and risks. Hormone replacement therapy is available to decrease symptoms and risks. You should talk to your caregiver about whether hormone replacement therapy is right for you.  Use sunscreen. Apply sunscreen liberally and repeatedly throughout the day. You should seek shade when your shadow is shorter than you. Protect yourself by wearing long sleeves, pants, a wide-brimmed hat, and sunglasses year round, whenever you are outdoors.  Once a month, do a whole body skin  exam, using a mirror to look at the skin on your back. Notify your caregiver of new moles, moles that have irregular borders, moles that are larger than a pencil eraser, or moles that have changed in shape or color.  Stay current with required immunizations.  Influenza vaccine. All adults should be immunized every year.  Tetanus, diphtheria, and acellular pertussis (Td, Tdap) vaccine. Pregnant women should receive 1 dose of Tdap vaccine during each pregnancy. The dose should be obtained regardless of the length of time since the last dose. Immunization is preferred during the 27th to 36th week of gestation. An adult who has not previously received Tdap or who does not know her vaccine status should receive 1 dose of Tdap. This initial dose should be followed by tetanus and diphtheria toxoids (Td) booster doses every 10 years. Adults with an unknown or incomplete history of completing a 3-dose immunization series with Td-containing vaccines should begin or complete a primary immunization series including a Tdap dose. Adults should receive a Td booster every 10 years.  Varicella vaccine. An adult without evidence of immunity to varicella should receive 2 doses or a second dose if she has previously received 1 dose. Pregnant females who do not have evidence of immunity should receive the first dose after pregnancy. This first dose should be obtained before leaving the health care facility. The second dose should be obtained 4 8 weeks after the first dose.  Human papillomavirus (HPV) vaccine. Females aged 1  26 years who have not received the vaccine previously should obtain the 3-dose series. The vaccine is not recommended for use in pregnant females. However, pregnancy testing is not needed before receiving a dose. If a female is found to be pregnant after receiving a dose, no treatment is needed. In that case, the remaining doses should be delayed until after the pregnancy. Immunization is recommended for  any person with an immunocompromised condition through the age of 26 years if she did not get any or all doses earlier. During the 3-dose series, the second dose should be obtained 4 8 weeks after the first dose. The third dose should be obtained 24 weeks after the first dose and 16 weeks after the second dose.  Zoster vaccine. One dose is recommended for adults aged 25 years or older unless certain conditions are present.  Measles, mumps, and rubella (MMR) vaccine. Adults born before [birth year] generally are considered immune to measles and mumps. Adults born in [birth year] or later should have 1 or more doses of MMR vaccine unless there is a contraindication to the vaccine or there is laboratory evidence of immunity to each of the three diseases. A routine second dose of MMR vaccine should be obtained at least 28 days after the first dose for students attending postsecondary schools, health care workers, or international travelers. People who received inactivated measles vaccine or an unknown type of measles vaccine during 1963 1967 should receive 2 doses of MMR vaccine. People who received inactivated mumps vaccine or an unknown type of mumps vaccine before 1979 and are at high risk for mumps infection should consider immunization with 2 doses of MMR vaccine. For females of childbearing age, rubella immunity should be determined. If there is no evidence of immunity, females who are not pregnant should be vaccinated. If there is no evidence of immunity, females who are pregnant should delay immunization until after pregnancy. Unvaccinated health care workers born before [birth year] who lack laboratory evidence of measles, mumps, or rubella immunity or laboratory confirmation of disease should consider measles and mumps immunization with 2 doses of MMR vaccine or rubella immunization with 1 dose of MMR vaccine.  Pneumococcal 13-valent conjugate (PCV13) vaccine. When indicated, a person who is uncertain of her immunization  history and has no record of immunization should receive the PCV13 vaccine. An adult aged 34 years or older who has certain medical conditions and has not been previously immunized should receive 1 dose of PCV13 vaccine. This PCV13 should be followed with a dose of pneumococcal polysaccharide (PPSV23) vaccine. The PPSV23 vaccine dose should be obtained at least 8 weeks after the dose of PCV13 vaccine. An adult aged 47 years or older who has certain medical conditions and previously received 1 or more doses of PPSV23 vaccine should receive 1 dose of PCV13. The PCV13 vaccine dose should be obtained 1 or more years after the last PPSV23 vaccine dose.  Pneumococcal polysaccharide (PPSV23) vaccine. When PCV13 is also indicated, PCV13 should be obtained first. All adults aged 31 years and older should be immunized. An adult younger than age 53 years who has certain medical conditions should be immunized. Any person who resides in a nursing home or long-term care facility should be immunized. An adult smoker should be immunized. People with an immunocompromised condition and certain other conditions should receive both PCV13 and PPSV23 vaccines. People with human immunodeficiency virus (HIV) infection should be immunized as soon as possible after diagnosis. Immunization during chemotherapy or radiation therapy should be avoided.  Routine use of PPSV23 vaccine is not recommended for American Indians, [address], or people younger than 65 years unless there are medical conditions that require PPSV23 vaccine. When indicated, people who have unknown immunization and have no record of immunization should receive PPSV23 vaccine. One-time revaccination 5 years after the first dose of PPSV23 is recommended for people aged 65 64 years who have chronic kidney failure, nephrotic syndrome, asplenia, or immunocompromised conditions. People who received 1 2 doses of PPSV23 before age 37 years should receive another dose of PPSV23  vaccine at age 87 years or later if at least 5 years have passed since the previous dose. Doses of PPSV23 are not needed for people immunized with PPSV23 at or after age 16 years.  Meningococcal vaccine. Adults with asplenia or persistent complement component deficiencies should receive 2 doses of quadrivalent meningococcal conjugate (MenACWY-D) vaccine. The doses should be obtained at least 2 months apart. Microbiologists working with certain meningococcal bacteria, military recruits, people at risk during an outbreak, and people who travel to or live in countries with a high rate of meningitis should be immunized. A first-year college student up through age 71 years who is living in a residence hall should receive a dose if she did not receive a dose on or after her 16th birthday. Adults who have certain high-risk conditions should receive one or more doses of vaccine.  Hepatitis A vaccine. Adults who wish to be protected from this disease, have certain high-risk conditions, work with hepatitis A-infected animals, work in hepatitis A research labs, or travel to or work in countries with a high rate of hepatitis A should be immunized. Adults who were previously unvaccinated and who anticipate close contact with an international adoptee during the first 60 days after arrival in the [place] from a country with a high rate of hepatitis A should be immunized.  Hepatitis B vaccine. Adults who wish to be protected from this disease, have certain high-risk conditions, may be exposed to blood or other infectious body fluids, are household contacts or sex partners of hepatitis B positive people, are clients or workers in certain care facilities, or travel to or work in countries with a high rate of hepatitis B should be immunized.  Haemophilus influenzae type b (Hib) vaccine. A previously unvaccinated person with asplenia or sickle cell disease or having a scheduled splenectomy should receive 1 dose of Hib  vaccine. Regardless of previous immunization, a recipient of a hematopoietic stem cell transplant should receive a 3-dose series 6 12 months after her successful transplant. Hib vaccine is not recommended for adults with HIV infection. Preventive Services / Frequency Ages 70 to 36  Blood pressure check.** / Every 1 to 2 years.  Lipid and cholesterol check.** / Every 5 years beginning at age 24.  Clinical breast exam.** / Every 3 years for women in their 90s and 30s.  BRCA-related cancer risk assessment.** / For women who have family members with a BRCA-related cancer (breast, ovarian, tubal, or peritoneal cancers).  Pap test.** / Every 2 years from ages 66 through 23. Every 3 years starting at age 54 through age 47 or 74 with a history of 3 consecutive normal Pap tests.  HPV screening.** / Every 3 years from ages 59 through ages 52 to 43 with a history of 3 consecutive normal Pap tests.  Hepatitis C blood test.** / For any individual with known risks for hepatitis C.  Skin self-exam. / Monthly.  Influenza vaccine. / Every year.  Tetanus, diphtheria, and acellular pertussis (Tdap, Td) vaccine.** / Consult your caregiver. Pregnant women should receive 1 dose of Tdap vaccine during each pregnancy. 1 dose of Td every 10 years.  Varicella vaccine.** / Consult your caregiver. Pregnant females who do not have evidence of immunity should receive the first dose after pregnancy.  HPV vaccine. / 3 doses over 6 months, if 26 and younger. The vaccine is not recommended for use in pregnant females. However, pregnancy testing is not needed before receiving a dose.  Measles, mumps, rubella (MMR) vaccine.** / You need at least 1 dose of MMR if you were born in 1957 or later. You may also need a 2nd dose. For females of childbearing age, rubella immunity should be determined. If there is no evidence of immunity, females who are not pregnant should be vaccinated. If there is no evidence of immunity, females  who are pregnant should delay immunization until after pregnancy.  Pneumococcal 13-valent conjugate (PCV13) vaccine.** / Consult your caregiver.  Pneumococcal polysaccharide (PPSV23) vaccine.** / 1 to 2 doses if you smoke cigarettes or if you have certain conditions.  Meningococcal vaccine.** / 1 dose if you are age 33 to 2 years and a Orthoptist living in a residence hall, or have one of several medical conditions, you need to get vaccinated against meningococcal disease. You may also need additional booster doses.  Hepatitis A vaccine.** / Consult your caregiver.  Hepatitis B vaccine.** / Consult your caregiver.  Haemophilus influenzae type b (Hib) vaccine.** / Consult your caregiver.     Neta Mends. Panosh M.D.

## 2013-09-25 ENCOUNTER — Encounter: Payer: Self-pay | Admitting: Internal Medicine

## 2014-07-27 ENCOUNTER — Encounter: Payer: Self-pay | Admitting: Internal Medicine

## 2014-08-09 ENCOUNTER — Emergency Department (HOSPITAL_COMMUNITY)
Admission: EM | Admit: 2014-08-09 | Discharge: 2014-08-09 | Disposition: A | Discharge status: Home / Self Care | Payer: BC Managed Care – PPO | Source: Home / Self Care | Attending: Emergency Medicine | Admitting: Emergency Medicine

## 2014-08-09 ENCOUNTER — Encounter (HOSPITAL_COMMUNITY): Payer: Self-pay | Admitting: *Deleted

## 2014-08-09 DIAGNOSIS — R05 Cough: Secondary | ICD-10-CM

## 2014-08-09 DIAGNOSIS — R058 Other specified cough: Secondary | ICD-10-CM

## 2014-08-09 MED ORDER — HYDROCODONE-HOMATROPINE 5-1.5 MG/5ML PO SYRP: 5.0000 mL | ORAL_SOLUTION | Freq: Four times a day (QID) | ORAL | Status: DC | PRN

## 2014-08-09 MED ORDER — IPRATROPIUM BROMIDE 0.06 % NA SOLN: 2.0000 | Freq: Four times a day (QID) | NASAL | Status: DC

## 2014-08-09 NOTE — ED Notes (Signed)
C/o sorethroat on Wednesday, then got fever, cough and headache.  Now just left with cough at night when her sinuses drain down her throat. Cough is keeping her awake. Chest hurts from coughing.  Sputum is green.

## 2014-08-09 NOTE — ED Provider Notes (Signed)
CSN: [id number]     Arrival date & time 08/09/14  0940 History   First MD Initiated Contact with Patient 08/09/14 [phone]     Chief Complaint  Patient presents with  . Fever  . Cough   (Consider location/radiation/quality/duration/timing/severity/associated sxs/prior Treatment) HPI  She is a 38 year old woman here for evaluation of cough. She states her symptoms started about 5 days ago with a scratchy throat. She went on to develop fevers, myalgias, postnasal drip, cough. She states everything has improved, except the postnasal drip and cough. She reports some soreness in her chest when she coughs as well. No current fevers or chills.  Past Medical History  Diagnosis Date  . History of chickenpox   . Headache(784.0)    History reviewed. No pertinent past surgical history. Family History  Problem Relation Age of Onset  . Hypertension Mother   . Diabetes Paternal Uncle   . Diabetes Maternal Grandmother   . Stroke Maternal Grandmother   . Stroke Maternal Grandfather   . Arthritis    . Osteoarthritis     History  Substance Use Topics  . Smoking status: Former Smoker    Types: Cigarettes    Quit date: 05/09/2013  . Smokeless tobacco: Not on file     Comment: Stopped 1 year ago  . Alcohol Use: Yes     Comment: occasional   OB History    Gravida Para Term Preterm AB TAB SAB Ectopic Multiple Living   1 1             Review of Systems  Constitutional: Negative for fever and chills.  HENT: Positive for postnasal drip. Negative for congestion, rhinorrhea and sore throat.   Respiratory: Positive for cough. Negative for shortness of breath.     Allergies  Escitalopram oxalate  Home Medications   Prior to Admission medications   Medication Sig Start Date End Date Taking? Authorizing Provider  Multiple Vitamins-Minerals (WOMENS MULTIVITAMIN PLUS PO) Take 1 tablet by mouth daily.   Yes Historical Provider, MD  HYDROcodone-homatropine (HYCODAN) 5-1.5 MG/5ML syrup Take 5 mLs by  mouth every 6 (six) hours as needed for cough. 08/09/14   Charm RingsErin J Karenann Mcgrory, MD  ipratropium (ATROVENT) 0.06 % nasal spray Place 2 sprays into both nostrils 4 (four) times daily. 08/09/14   Charm RingsErin J Nikolus Marczak, MD  Prenatal Vit-Fe Fumarate-FA (PRENATAL PO) Take by mouth.    Historical Provider, MD   BP 112/79 mmHg  Pulse 76  Temp(Src) 98.4 F (36.9 C) (Oral)  SpO2 100%  LMP 07/29/2014 Physical Exam  Constitutional: She is oriented to person, place, and time. She appears well-developed and well-nourished. No distress.  HENT:  Head: Normocephalic and atraumatic.  Right Ear: External ear normal.  Left Ear: External ear normal.  Nose: Mucosal edema and rhinorrhea present.  Mouth/Throat: No oropharyngeal exudate.  Mild cobblestoning  Eyes: Conjunctivae are normal.  Neck: Neck supple.  Cardiovascular: Normal rate, regular rhythm and normal heart sounds.   No murmur heard. Pulmonary/Chest: Effort normal and breath sounds normal. No respiratory distress. She has no wheezes. She has no rales.  Lymphadenopathy:    She has no cervical adenopathy.  Neurological: She is alert and oriented to person, place, and time.    ED Course  Procedures (including critical care time) Labs Review Labs Reviewed - No data to display  Imaging Review No results found.   MDM   1. Post-viral cough syndrome    She also likely has a component of continued postnasal drip. We'll  treat with Atrovent nasal spray. Also discussed that she can take an over-the-counter allergy pill if needed. Provided prescription for Hycodan cough syrup. Discussed not taking this medication while driving or at work. Okay to return to work tomorrow. Follow-up as needed.    Charm RingsErin J Salman Wellen, MD 08/09/14 [phone]

## 2014-08-09 NOTE — Discharge Instructions (Signed)
Your cough is left over from your cold and the lingering drainage. Use the Atrovent nasal spray 4 times a day to help decrease the drainage. You can also get allegra, claritin or zyrtec at the drug store. Use the cough syrup as needed.  This has a narcotic medication in it so do not take it while driving or working.  The cough should gradually improve over the next 2 weeks.

## 2014-09-28 ENCOUNTER — Ambulatory Visit (INDEPENDENT_AMBULATORY_CARE_PROVIDER_SITE_OTHER): Payer: BLUE CROSS/BLUE SHIELD | Admitting: Internal Medicine

## 2014-09-28 ENCOUNTER — Encounter: Payer: Self-pay | Admitting: Internal Medicine

## 2014-09-28 VITALS — BP 100/70 | Temp 98.2°F | Ht 63.0 in | Wt 144.2 lb

## 2014-09-28 DIAGNOSIS — R5383 Other fatigue: Secondary | ICD-10-CM

## 2014-09-28 DIAGNOSIS — Z0000 Encounter for general adult medical examination without abnormal findings: Secondary | ICD-10-CM

## 2014-09-28 LAB — BASIC METABOLIC PANEL
BUN: 11 mg/dL (ref 6–23)
CO2: 26 mEq/L (ref 19–32)
Calcium: 9 mg/dL (ref 8.4–10.5)
Chloride: 107 mEq/L (ref 96–112)
Creatinine, Ser: 0.6 mg/dL (ref 0.4–1.2)
GFR: 160.72 mL/min (ref >60.00)
Glucose, Bld: 94 mg/dL (ref 70–99)
Potassium: 4.3 mEq/L (ref 3.5–5.1)
Sodium: 139 mEq/L (ref 135–145)

## 2014-09-28 LAB — LIPID PANEL
Cholesterol: 211 mg/dL — ABNORMAL HIGH (ref 0–200)
HDL: 59.5 mg/dL (ref >39.00)
LDL Cholesterol: 130 mg/dL — ABNORMAL HIGH (ref 0–99)
NonHDL: 151.5
Total CHOL/HDL Ratio: 4
Triglycerides: 106 mg/dL (ref 0.0–149.0)
VLDL: 21.2 mg/dL (ref 0.0–40.0)

## 2014-09-28 LAB — CBC WITH DIFFERENTIAL/PLATELET
Basophils Absolute: 0 10*3/uL (ref 0.0–0.1)
Basophils Relative: 0.4 % (ref 0.0–3.0)
Eosinophils Absolute: 0.1 10*3/uL (ref 0.0–0.7)
Eosinophils Relative: 1.2 % (ref 0.0–5.0)
HCT: 38.5 % (ref 36.0–46.0)
Hemoglobin: 12.5 g/dL (ref 12.0–15.0)
Lymphocytes Relative: 31.5 % (ref 12.0–46.0)
Lymphs Abs: 1.3 10*3/uL (ref 0.7–4.0)
MCHC: 32.4 g/dL (ref 30.0–36.0)
MCV: 93.7 fl (ref 78.0–100.0)
Monocytes Absolute: 0.4 10*3/uL (ref 0.1–1.0)
Monocytes Relative: 9.4 % (ref 3.0–12.0)
Neutro Abs: 2.4 10*3/uL (ref 1.4–7.7)
Neutrophils Relative %: 57.5 % (ref 43.0–77.0)
Platelets: 315 10*3/uL (ref 150.0–400.0)
RBC: 4.11 Mil/uL (ref 3.87–5.11)
RDW: 12.8 % (ref 11.5–15.5)
WBC: 4.2 10*3/uL (ref 4.0–10.5)

## 2014-09-28 LAB — HEPATIC FUNCTION PANEL
ALT: 12 U/L (ref 0–35)
AST: 17 U/L (ref 0–37)
Albumin: 3.9 g/dL (ref 3.5–5.2)
Alkaline Phosphatase: 41 U/L (ref 39–117)
Bilirubin, Direct: 0 mg/dL (ref 0.0–0.3)
Total Bilirubin: 0.5 mg/dL (ref 0.2–1.2)
Total Protein: 7.1 g/dL (ref 6.0–8.3)

## 2014-09-28 LAB — TSH: TSH: 1.08 u[IU]/mL (ref 0.35–4.50)

## 2014-09-28 LAB — T4, FREE: Free T4: 0.83 ng/dL (ref 0.60–1.60)

## 2014-09-28 NOTE — Progress Notes (Signed)
Pre visit review using our clinic review tool, if applicable. No additional management support is needed unless otherwise documented below in the visit note.  Chief Complaint  Patient presents with  . Annual Exam    HPI: Patient  Kathy Steele  39 y.o. 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 PAP SMEAR  10/26/2014  . INFLUENZA VACCINE  04/26/2015  . TETANUS/TDAP  07/06/2020   Health Maintenance Review LIFESTYLE:  Exercise:   Not recently  Tobacco/ETS: no Alcohol:  1 q 5 months  Sugar beverages:no soda  2 juice and water flavor  Sleep:  Tired all the time  Bed at 8  Up 630.  Drug use: no  PAP:  Periods regular .  Hx of fertility rx.  Last June . Shots     ROS:  GEN/ HEENT: No fever, significant weight changes sweats headaches vision problems hearing changes, CV/ PULM; No chest pain shortness of breath cough, syncope,edema  change in exercise tolerance. GI /GU: No adominal pain, vomiting, change in bowel habits. No blood in the stool. No significant GU symptoms. SKIN/HEME: ,no acute skin rashes suspicious lesions or bleeding. No lymphadenopathy, nodules, masses.  NEURO/ PSYCH:  No neurologic signs such as weakness numbness. No depression anxiety. IMM/ Allergy: No unusual infections.  Allergy .   REST of 12 system review negative except as per HPI   Past Medical History  Diagnosis Date  . History of chickenpox   . Headache(784.0)     History reviewed. No pertinent past surgical history.  Family History  Problem Relation Age of Onset  . Hypertension Mother   . Diabetes Paternal Uncle   . Diabetes Maternal Grandmother   . Stroke Maternal Grandmother   . Stroke Maternal  Grandfather   . Arthritis    . Osteoarthritis      History   Social History  . Marital Status: Married    Spouse Name: N/A    Number of Children: N/A  . Years of Education: N/A   Social History Main Topics  . Smoking status: Former Smoker    Types: Cigarettes    Quit date: 05/09/2013  . Smokeless tobacco: None     Comment: Stopped 1 year ago  . Alcohol Use: Yes     Comment: occasional  . Drug Use: No  . Sexual Activity: None   Other Topics Concern  . 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 Multiple Vitamins-Minerals (WOMENS MULTIVITAMIN PLUS PO) Take 1 tablet by mouth daily.  . [DISCONTINUED] HYDROcodone-homatropine (HYCODAN) 5-1.5 MG/5ML syrup Take 5 mLs by mouth  every 6 (six) hours as needed for cough.  . [DISCONTINUED] ipratropium (ATROVENT) 0.06 % nasal spray Place 2 sprays into both nostrils 4 (four) times daily.  . [DISCONTINUED] Prenatal Vit-Fe Fumarate-FA (PRENATAL PO) Take by mouth.    EXAM:  BP 100/70 mmHg  Temp(Src) 98.2 F (36.8 C) (Oral)  Ht  (1.6 m)  Wt 144 lb 3.2 oz (65.409 kg)  BMI 25.55 kg/m2  Body mass index is 25.55 kg/(m^2).  Physical Exam: Vital signs reviewed UJW:JXBJ is a well-developed well-nourished alert cooperative    who appearsr stated age in no acute distress.  HEENT: normocephalic atraumatic , Eyes: PERRL EOM's full, conjunctiva clear, Nares: paten,t no deformity discharge or tenderness., Ears: no deformity EAC's clear TMs with normal landmarks. Mouth: clear OP, no lesions, edema.  Moist mucous membranes. Dentition in adequate repair. NECK: supple without masses, thyromegaly or  bruits. CHEST/PULM:  Clear to auscultation and percussion breath sounds equal no wheeze , rales or rhonchi. No chest wall deformities or tenderness. Breast: normal by inspection . No dimpling, discharge, masses, tenderness or discharge . CV: PMI is nondisplaced, S1 S2 no gallops, murmurs, rubs. Peripheral pulses are full without delay.No JVD .  ABDOMEN: Bowel sounds normal nontender  No guard or rebound, no hepato splenomegal no CVA tenderness.  No hernia. Extremtities:  No clubbing cyanosis or edema, no acute joint swelling or redness no focal atrophy NEURO:  Oriented x3, cranial nerves 3-12 appear to be intact, no obvious focal weakness,gait within normal limits no abnormal reflexes or asymmetrical SKIN: No acute rashes normal turgor, color, no bruising or petechiae. PSYCH: Oriented, good eye contact, no obvious depression anxiety, cognition and judgment appear normal. LN: no cervical axillary inguinal adenopathy   Wt Readings from Last 3 Encounters:  09/28/14 144 lb 3.2 oz (65.409 kg)  09/23/13 117 lb (53.071 kg)  09/16/12 118 lb (53.524 kg)     ASSESSMENT AND PLAN:  Discussed the following assessment and plan:  Visit for preventive health examination - disc weight gain etc . - Plan: Basic metabolic panel, CBC with Differential, Hepatic function panel, Lipid panel, TSH, T4, free  Other fatigue - Plan: Basic metabolic panel, CBC with Differential, Hepatic function panel, Lipid panel, TSH, T4, free  Patient Care Team: Madelin Headings, MD as PCP - General Trish Mage, MD (Dermatology) Robley Fries, MD as Attending Physician (Obstetrics and Gynecology) Rushie Nyhan, MD as Consulting Physician (Obstetrics and Gynecology) Patient Instructions   Track what you are eating and drinking   Avoid beverages with calories even juice except milk.   Will notify you  of labs when available.  Increase exercise and eating well can increase energy. Consider getting  pedometer . Protein  at am meal    3500 calories is the energy content of a pound of body weight .Must have a 3500 cal deficit to lose one pound . Thus decrease 500 calorie equivalent per day in food or drink intake / or exercise  for 7 days to lose one pound.   Healthy lifestyle includes : At least 150 minutes of exercise weeks  , weight at healthy levels, which is usually   BMI 19-25. Avoid trans fats and processed foods;  Increase fresh fruits and veges to 5 servings per day. And avoid sweet beverages including tea and juice. Mediterranean diet with olive oil and nuts have been noted to be heart and brain healthy . Avoid tobacco products . Limit  alcohol to  7 per week for women  and 14 servings for men.  Get adequate sleep . Wear seat belts . Don't text and drive .      Why follow it? Research shows. . Those who follow the Mediterranean diet have a reduced risk of heart disease  . The diet is associated with a reduced incidence of Parkinson's and Alzheimer's diseases . People following the diet may have longer life expectancies and lower rates of chronic diseases  . The Dietary Guidelines for Americans recommends the Mediterranean diet as an eating plan to promote health and prevent disease  What Is the Mediterranean Diet?  . Healthy eating plan based on typical foods and recipes of Mediterranean-style cooking . The diet is primarily a plant based diet; these foods should make up a majority of meals   Starches - Plant based foods should make up a majority of meals - They are an important sources of vitamins, minerals, energy, antioxidants, and fiber - Choose whole grains, foods high in fiber and minimally processed items  - Typical grain sources include wheat, oats, barley, corn, Retzloff rice, bulgar, farro, millet, polenta, couscous  - Various types of beans include chickpeas, lentils, fava beans, black beans, white beans   Fruits  Veggies - Large quantities of antioxidant rich  fruits & veggies; 6 or more servings  - Vegetables can be eaten raw or lightly drizzled with oil and cooked  - Vegetables common to the traditional Mediterranean Diet include: artichokes, arugula, beets, broccoli, brussel sprouts, cabbage, carrots, celery, collard greens, cucumbers, eggplant, kale, leeks, lemons, lettuce, mushrooms, okra, onions, peas, peppers, potatoes, pumpkin, radishes, rutabaga, shallots, spinach, sweet potatoes, turnips, zucchini - Fruits common to the Mediterranean Diet include: apples, apricots, avocados, cherries, clementines, dates, figs, grapefruits, grapes, melons, nectarines, oranges, peaches, pears, pomegranates, strawberries, tangerines  Fats - Replace butter and margarine with healthy oils, such as olive oil, canola oil, and tahini  - Limit nuts to no more than a handful a day  - Nuts include walnuts, almonds, pecans, pistachios, pine nuts  - Limit or avoid candied, honey roasted or heavily salted nuts - Olives are central to the [employer] - can be eaten whole or used in a variety of dishes   Meats Protein - Limiting red meat: no more than a few times a month - When eating red meat: choose lean cuts and keep the portion to the size of deck of cards - Eggs: approx. 0 to 4 times a week  - Fish and lean poultry: at least 2 a week  - Healthy protein sources include, chicken, [place], lean beef, lamb - Increase intake of seafood such as tuna, salmon, trout, mackerel, shrimp, scallops - Avoid or limit high fat processed meats such as sausage and bacon  Dairy - Include moderate amounts of low fat dairy products  - Focus on healthy dairy such as fat free yogurt, skim milk, low or reduced fat cheese - Limit dairy products higher in fat such as whole or 2% milk, cheese, ice cream  Alcohol - Moderate amounts of red wine is ok  - No more than 5 oz daily for women (all ages) and men older than age 64  - No more than 10 oz of wine daily for men younger than 34  Other -  Limit sweets and other desserts  - Use herbs and spices instead of salt to flavor foods  - Herbs and spices common to the traditional Mediterranean Diet include: basil, bay leaves, chives, cloves, cumin, fennel, garlic, lavender, marjoram, mint,  oregano, parsley, pepper, rosemary, sage, savory, sumac, tarragon, thyme   It's not just a diet, it's a lifestyle:  . The Mediterranean diet includes lifestyle factors typical of those in the region  . Foods, drinks and meals are best eaten with others and savored . Daily physical activity is important for overall good health . This could be strenuous exercise like running and aerobics . This could also be more leisurely activities such as walking, housework, yard-work, or taking the stairs . Moderation is the key; a balanced and healthy diet accommodates most foods and drinks . Consider portion sizes and frequency of consumption of certain foods   Meal Ideas & Options:  . Breakfast:  o Whole wheat toast or whole wheat English muffins with peanut butter & hard boiled egg o Steel cut oats topped with apples & cinnamon and skim milk  o Fresh fruit: banana, strawberries, melon, berries, peaches  o Smoothies: strawberries, bananas, greek yogurt, peanut butter o Low fat greek yogurt with blueberries and granola  o Egg white omelet with spinach and mushrooms o Breakfast couscous: whole wheat couscous, apricots, skim milk, cranberries  . Sandwiches:  o Hummus and grilled vegetables (peppers, zucchini, squash) on whole wheat bread   o Grilled chicken on whole wheat pita with lettuce, tomatoes, cucumbers or tzatziki  o Tuna salad on whole wheat bread: tuna salad made with greek yogurt, olives, red peppers, capers, green onions o Garlic rosemary lamb pita: lamb sauted with garlic, rosemary, salt & pepper; add lettuce, cucumber, greek yogurt to pita - flavor with lemon juice and black pepper  . Seafood:  o Mediterranean grilled salmon, seasoned with garlic,  basil, parsley, lemon juice and black pepper o Shrimp, lemon, and spinach whole-grain pasta salad made with low fat greek yogurt  o Seared scallops with lemon orzo  o Seared tuna steaks seasoned salt, pepper, coriander topped with tomato mixture of olives, tomatoes, olive oil, minced garlic, parsley, green onions and cappers  . Meats:  o Herbed greek chicken salad with kalamata olives, cucumber, feta  o Red bell peppers stuffed with spinach, bulgur, lean ground beef (or lentils) & topped with feta   o Kebabs: skewers of chicken, tomatoes, onions, zucchini, squash  o [place] burgers: made with red onions, mint, dill, lemon juice, feta cheese topped with roasted red peppers . Vegetarian o Cucumber salad: cucumbers, artichoke hearts, celery, red onion, feta cheese, tossed in olive oil & lemon juice  o Hummus and whole grain pita points with a greek salad (lettuce, tomato, feta, olives, cucumbers, red onion) o Lentil soup with celery, carrots made with vegetable broth, garlic, salt and pepper  o Tabouli salad: parsley, bulgur, mint, scallions, cucumbers, tomato, radishes, lemon juice, olive oil, salt and pepper.         Neta Mends. Rajohn Henery M.D.

## 2014-09-28 NOTE — Patient Instructions (Addendum)
Track what you are eating and drinking   Avoid beverages with calories even juice except milk.   Will notify you  of labs when available.  Increase exercise and eating well can increase energy. Consider getting pedometer . Protein  at am meal    3500 calories is the energy content of a pound of body weight .Must have a 3500 cal deficit to lose one pound . Thus decrease 500 calorie equivalent per day in food or drink intake / or exercise  for 7 days to lose one pound.   Healthy lifestyle includes : At least 150 minutes of exercise weeks  , weight at healthy levels, which is usually   BMI 19-25. Avoid trans fats and processed foods;  Increase fresh fruits and veges to 5 servings per day. And avoid sweet beverages including tea and juice. Mediterranean diet with olive oil and nuts have been noted to be heart and brain healthy . Avoid tobacco products . Limit  alcohol to  7 per week for women and 14 servings for men.  Get adequate sleep . Wear seat belts . Don't text and drive .      Why follow it? Research shows. . Those who follow the Mediterranean diet have a reduced risk of heart disease  . The diet is associated with a reduced incidence of Parkinson's and Alzheimer's diseases . People following the diet may have longer life expectancies and lower rates of chronic diseases  . The Dietary Guidelines for Americans recommends the Mediterranean diet as an eating plan to promote health and prevent disease  What Is the Mediterranean Diet?  . Healthy eating plan based on typical foods and recipes of Mediterranean-style cooking . The diet is primarily a plant based diet; these foods should make up a majority of meals   Starches - Plant based foods should make up a majority of meals - They are an important sources of vitamins, minerals, energy, antioxidants, and fiber - Choose whole grains, foods high in fiber and minimally processed items  - Typical grain sources include wheat, oats,  barley, corn, [place] rice, bulgar, farro, millet, polenta, couscous  - Various types of beans include chickpeas, lentils, fava beans, black beans, white beans   Fruits  Veggies - Large quantities of antioxidant rich fruits & veggies; 6 or more servings  - Vegetables can be eaten raw or lightly drizzled with oil and cooked  - Vegetables common to the traditional Mediterranean Diet include: artichokes, arugula, beets, broccoli, brussel sprouts, cabbage, carrots, celery, collard greens, cucumbers, eggplant, kale, leeks, lemons, lettuce, mushrooms, okra, onions, peas, peppers, potatoes, pumpkin, radishes, rutabaga, shallots, spinach, sweet potatoes, turnips, zucchini - Fruits common to the Mediterranean Diet include: apples, apricots, avocados, cherries, clementines, dates, figs, grapefruits, grapes, melons, nectarines, oranges, peaches, pears, pomegranates, strawberries, tangerines  Fats - Replace butter and margarine with healthy oils, such as olive oil, canola oil, and tahini  - Limit nuts to no more than a handful a day  - Nuts include walnuts, almonds, pecans, pistachios, pine nuts  - Limit or avoid candied, honey roasted or heavily salted nuts - Olives are central to the [employer] - can be eaten whole or used in a variety of dishes   Meats Protein - Limiting red meat: no more than a few times a month - When eating red meat: choose lean cuts and keep the portion to the size of deck of cards - Eggs: approx. 0 to 4 times a week  - Fish and  lean poultry: at least 2 a week  - Healthy protein sources include, chicken, [place], lean beef, lamb - Increase intake of seafood such as tuna, salmon, trout, mackerel, shrimp, scallops - Avoid or limit high fat processed meats such as sausage and bacon  Dairy - Include moderate amounts of low fat dairy products  - Focus on healthy dairy such as fat free yogurt, skim milk, low or reduced fat cheese - Limit dairy products higher in fat such as whole or 2%  milk, cheese, ice cream  Alcohol - Moderate amounts of red wine is ok  - No more than 5 oz daily for women (all ages) and men older than age 71  - No more than 10 oz of wine daily for men younger than 47  Other - Limit sweets and other desserts  - Use herbs and spices instead of salt to flavor foods  - Herbs and spices common to the traditional Mediterranean Diet include: basil, bay leaves, chives, cloves, cumin, fennel, garlic, lavender, marjoram, mint, oregano, parsley, pepper, rosemary, sage, savory, sumac, tarragon, thyme   It's not just a diet, it's a lifestyle:  . The Mediterranean diet includes lifestyle factors typical of those in the region  . Foods, drinks and meals are best eaten with others and savored . Daily physical activity is important for overall good health . This could be strenuous exercise like running and aerobics . This could also be more leisurely activities such as walking, housework, yard-work, or taking the stairs . Moderation is the key; a balanced and healthy diet accommodates most foods and drinks . Consider portion sizes and frequency of consumption of certain foods   Meal Ideas & Options:  . Breakfast:  o Whole wheat toast or whole wheat English muffins with peanut butter & hard boiled egg o Steel cut oats topped with apples & cinnamon and skim milk  o Fresh fruit: banana, strawberries, melon, berries, peaches  o Smoothies: strawberries, bananas, greek yogurt, peanut butter o Low fat greek yogurt with blueberries and granola  o Egg white omelet with spinach and mushrooms o Breakfast couscous: whole wheat couscous, apricots, skim milk, cranberries  . Sandwiches:  o Hummus and grilled vegetables (peppers, zucchini, squash) on whole wheat bread   o Grilled chicken on whole wheat pita with lettuce, tomatoes, cucumbers or tzatziki  o Tuna salad on whole wheat bread: tuna salad made with greek yogurt, olives, red peppers, capers, green onions o Garlic rosemary  lamb pita: lamb sauted with garlic, rosemary, salt & pepper; add lettuce, cucumber, greek yogurt to pita - flavor with lemon juice and black pepper  . Seafood:  o Mediterranean grilled salmon, seasoned with garlic, basil, parsley, lemon juice and black pepper o Shrimp, lemon, and spinach whole-grain pasta salad made with low fat greek yogurt  o Seared scallops with lemon orzo  o Seared tuna steaks seasoned salt, pepper, coriander topped with tomato mixture of olives, tomatoes, olive oil, minced garlic, parsley, green onions and cappers  . Meats:  o Herbed greek chicken salad with kalamata olives, cucumber, feta  o Red bell peppers stuffed with spinach, bulgur, lean ground beef (or lentils) & topped with feta   o Kebabs: skewers of chicken, tomatoes, onions, zucchini, squash  o [place] burgers: made with red onions, mint, dill, lemon juice, feta cheese topped with roasted red peppers . Vegetarian o Cucumber salad: cucumbers, artichoke hearts, celery, red onion, feta cheese, tossed in olive oil & lemon juice  o Hummus and whole  grain pita points with a greek salad (lettuce, tomato, feta, olives, cucumbers, red onion) o Lentil soup with celery, carrots made with vegetable broth, garlic, salt and pepper  o Tabouli salad: parsley, bulgur, mint, scallions, cucumbers, tomato, radishes, lemon juice, olive oil, salt and pepper.

## 2015-05-16 ENCOUNTER — Emergency Department (HOSPITAL_COMMUNITY)
Admission: EM | Admit: 2015-05-16 | Discharge: 2015-05-16 | Discharge status: Absent without leave | Payer: Self-pay | Source: Home / Self Care

## 2015-05-17 ENCOUNTER — Ambulatory Visit (INDEPENDENT_AMBULATORY_CARE_PROVIDER_SITE_OTHER): Payer: BLUE CROSS/BLUE SHIELD | Admitting: Family Medicine

## 2015-05-17 ENCOUNTER — Encounter: Payer: Self-pay | Admitting: Family Medicine

## 2015-05-17 VITALS — BP 110/78 | HR 64 | Temp 98.7°F | Wt 146.0 lb

## 2015-05-17 DIAGNOSIS — J029 Acute pharyngitis, unspecified: Secondary | ICD-10-CM

## 2015-05-17 LAB — POCT RAPID STREP A (OFFICE): Rapid Strep A Screen: NEGATIVE

## 2015-05-17 NOTE — Progress Notes (Signed)
Pre visit review using our clinic review tool, if applicable. No additional management support is needed unless otherwise documented below in the visit note. 

## 2015-05-17 NOTE — Progress Notes (Signed)
HPI:  Sore throat: -started: 3 days ago -symptoms:sore throat, temp 100 degrees, body aches -denies:fever, SOB, NVD, tooth pain -has tried: asa -sick contacts/travel/risks: denies flu exposure, tick exposure or or Ebola risks -Hx of: allergies  ROS: See pertinent positives and negatives per HPI.  Past Medical History  Diagnosis Date  . History of chickenpox   . Headache(784.0)     No past surgical history on file.  Family History  Problem Relation Age of Onset  . Hypertension Mother   . Diabetes Paternal Uncle   . Diabetes Maternal Grandmother   . Stroke Maternal Grandmother   . Stroke Maternal Grandfather   . Arthritis    . Osteoarthritis      Social History   Social History  . Marital Status: Married    Spouse Name: N/A  . Number of Children: N/A  . Years of Education: N/A   Social History Main Topics  . Smoking status: Former Smoker    Types: Cigarettes    Quit date: 05/09/2013  . Smokeless tobacco: None     Comment: Stopped 1 year ago  . Alcohol Use: Yes     Comment: occasional  . Drug Use: No  . Sexual Activity: Not Asked   Other Topics Concern  . 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  GENERAL: vitals reviewed and listed above, alert, oriented, appears well hydrated and in no acute distress  HEENT: atraumatic, conjunttiva clear, no obvious abnormalities on inspection of external nose and ears,  normal appearance of ear canals and TMs, clear nasal congestion, mild post oropharyngeal erythema with PND, no tonsillar edema or exudate, no sinus TTP  NECK: no obvious masses on inspection  LUNGS: clear to auscultation bilaterally, no wheezes, rales or rhonchi, good air movement  CV: HRRR, no peripheral edema  MS: moves all extremities without noticeable abnormality  PSYCH: pleasant and cooperative, no obvious depression or anxiety  ASSESSMENT AND PLAN:  Discussed the following assessment and plan:  Sore throat  -given HPI and exam findings today, a serious infection or illness is unlikely. We discussed potential etiologies, with VURI being most likely, and advised supportive care and monitoring. We discussed treatment side effects, likely course, antibiotic misuse, transmission, and signs of developing a serious illness. -strep neg -of course, we advised to return or notify a doctor immediately if symptoms worsen or persist or new concerns arise.    Patient Instructions  INSTRUCTIONS FOR UPPER RESPIRATORY INFECTION:  -plenty of rest and fluids  -nasal saline wash 2-3 times daily (use prepackaged nasal saline or bottled/distilled water if making your own)   -can use AFRIN nasal spray for drainage and nasal congestion - but do NOT use longer then 3-4  days  -can use tylenol (in no history of liver disease) or ibuprofen (if no history of kidney disease, bowel bleeding or significant heart disease) as directed for aches and sorethroat  -in the winter time, using a humidifier at night is helpful (please follow cleaning instructions)  -if you are taking a cough medication - use only as directed, may also try a teaspoon of honey to coat the throat and throat lozenges. If given a cough medication with codeine or hydrocodone or other narcotic please be advised that this contains a strong and  potentially addicting medication. Please follow instructions carefully, take as little as  possible and only use AS NEEDED for severe cough. Discuss potential side effects with your pharmacy. Please do not drive or operate machinery while taking these types of medications. Please do not take other sedating medications, drugs or alcohol while taking this medication without discussing with your doctor.  -for sore throat, salt water gargles can help  -follow up if you have fevers, facial pain, tooth pain, difficulty breathing or are worsening or symptoms persist longer then expected  Upper Respiratory Infection, Adult An upper respiratory infection (URI) is also known as the common cold. It is often caused by a type of germ (virus). Colds are easily spread (contagious). You can pass it to others by kissing, coughing, sneezing, or drinking out of the same glass. Usually, you get better in 1 to 3  weeks.  However, the cough can last for even longer. HOME CARE   Only take medicine as told by your doctor. Follow instructions provided above.  Drink enough water and fluids to keep your pee (urine) clear or pale yellow.  Get plenty of rest.  Return to work when your temperature is < 100 for 24 hours or as told by your doctor. You may use a face mask and wash your hands to stop your cold from spreading. GET HELP RIGHT AWAY IF:   After the first few days, you feel you are getting worse.  You have questions about your medicine.  You have chills, shortness of breath, or red spit (mucus).  You have pain in the face for more then 1-2 days, especially when you bend forward.  You have a fever, puffy (swollen) neck, pain when you swallow, or white spots in the back of your throat.  You have a bad headache, ear pain, sinus pain, or chest pain.  You have a high-pitched whistling sound when you breathe in and out (wheezing).  You cough up blood.  You have sore muscles or a stiff neck. MAKE SURE YOU:   Understand these instructions.  Will watch your condition.  Will get help right away  if you are not doing well or get worse. Document Released: 02/28/2008 Document Revised: 12/04/2011 Document Reviewed: 12/17/2013 [facility] Patient Information 2015 [place], [place]. This information is not intended to replace advice given to you by your health care provider. Make sure you discuss any questions you have with your health care provider.      Kriste Basque R.

## 2015-05-17 NOTE — Patient Instructions (Signed)

## 2015-05-20 ENCOUNTER — Telehealth: Payer: Self-pay | Admitting: Internal Medicine

## 2015-05-20 ENCOUNTER — Ambulatory Visit (INDEPENDENT_AMBULATORY_CARE_PROVIDER_SITE_OTHER): Payer: BLUE CROSS/BLUE SHIELD | Admitting: Medical

## 2015-05-20 ENCOUNTER — Encounter: Payer: Self-pay | Admitting: Medical

## 2015-05-20 VITALS — BP 118/76 | HR 85 | Temp 98.7°F | Ht 63.0 in | Wt 147.4 lb

## 2015-05-20 DIAGNOSIS — J029 Acute pharyngitis, unspecified: Secondary | ICD-10-CM

## 2015-05-20 DIAGNOSIS — J0100 Acute maxillary sinusitis, unspecified: Secondary | ICD-10-CM | POA: Diagnosis not present

## 2015-05-20 DIAGNOSIS — H6502 Acute serous otitis media, left ear: Secondary | ICD-10-CM

## 2015-05-20 MED ORDER — CEFDINIR 300 MG PO CAPS: 300.0000 mg | ORAL_CAPSULE | Freq: Two times a day (BID) | ORAL | Status: DC

## 2015-05-20 NOTE — Progress Notes (Signed)
Pre visit review using our clinic review tool, if applicable. No additional management support is needed unless otherwise documented below in the visit note. 

## 2015-05-20 NOTE — Progress Notes (Signed)
Subjective:    Patient ID: Kathy Steele, female    DOB: 1975-11-13, 39 y.o.   MRN: [id number]  HPI  Pt states on Saturday night . Pt felt muscle aches and very tight muscles at that time along with st that night that was moderate. She was lying down on her be and states passed out or fell asleep(But on discussion it sounds like she just fell asleep) Describes waking up next morning.. Husband with her at that time. Pt took  aspirin over the weekend. Pt muscles have been sore all week. Also  joints were tight. Pt went to [facility]. Per her report  Monday throat was real swollen and provider made a comment to that effect.. Rapid test neg. Pt tells me told uri.  Ha today mild to moderate but no gross motor/sensory function deficits.   lmp- yesterday.   Review of Systems  Constitutional: Negative for fever, chills and fatigue.  HENT: Positive for ear pain and sore throat.        Lt ear pain as well.   Respiratory: Negative for cough, chest tightness, shortness of breath and wheezing.   Cardiovascular: Negative for chest pain and palpitations.  Musculoskeletal: Negative for back pain.       Muscle still feel weak and sore.   Neurological: Positive for headaches. Negative for dizziness, seizures, syncope and numbness.  Hematological: Negative for adenopathy. Does not bruise/bleed easily.     Past Medical History  Diagnosis Date  . History of chickenpox   . Headache(784.0)     Social History   Social History  . Marital Status: Married    Spouse Name: N/A  . Number of Children: N/A  . Years of Education: N/A   Occupational History  . Not on file.   Social History Main Topics  . Smoking status: Former Smoker    Types: Cigarettes    Quit date: 05/09/2013  . Smokeless tobacco: Not on file     Comment: Stopped 1 year ago  . Alcohol Use: Yes     Comment: occasional  . Drug Use: No  . Sexual Activity: Not on file   Other Topics Concern  . 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  Family History  Problem Relation Age of Onset  . Hypertension Mother   . Diabetes Paternal Uncle   . Diabetes Maternal Grandmother   . Stroke Maternal Grandmother   . Stroke Maternal Grandfather   . Arthritis    . Osteoarthritis      Allergies  Allergen Reactions  . Escitalopram Oxalate     REACTION: Nausea, Ha and felt spacy    Current Outpatient Prescriptions on File Prior to Visit  Medication Sig Dispense Refill  . Multiple Vitamins-Minerals (WOMENS MULTIVITAMIN PLUS PO) Take 1 tablet by mouth daily.     No current facility-administered medications on file prior to visit.    BP 118/76 mmHg  Pulse 85  Temp(Src) 98.7 F (37.1 C) (Oral)  Ht  (1.6 m)  Wt 147 lb 6.4 oz (66.86 kg)  BMI 26.12 kg/m2  SpO2 100%  LMP 05/19/2015       Objective:   Physical Exam   General  Mental Status - Alert. General Appearance - Well groomed. Not in acute distress.  Skin Rashes- No Rashes.  HEENT Head- Normal. Ear Auditory Canal - Left- Normal. Right - Normal.Tympanic Membrane- Left- Normal. Right- Normal. Eye Sclera/Conjunctiva- Left- Normal. Right- Normal. Nose & Sinuses Nasal Mucosa- Left-  Not boggy or Congested. Right-  Not  boggy or Congested. Mouth & Throat Lips: Upper Lip- Normal: no dryness, cracking, pallor, cyanosis, or vesicular eruption. Lower Lip-Normal: no dryness, cracking, pallor, cyanosis or vesicular eruption. Buccal Mucosa- Bilateral- No Aphthous ulcers. Oropharynx- No Discharge or Erythema. Tonsils: Characteristics- Bilateral-  Bright erythema +  Congestion. Size/Enlargement- Bilateral- No enlargement. Discharge-  bilateral-None.  Neck Neck- Supple. No Masses. Good rom but sore   Chest and Lung Exam Auscultation: Breath Sounds:- even and unlabored.  Cardiovascular Auscultation:Rythm- Regular, rate and rhythm. Murmurs & Other Heart Sounds:Ausculatation of the heart reveal- No Murmurs.  Lymphatic Head & Neck General Head & Neck Lymphatics: Bilateral: Description- No Localized lymphadenopathy.   Abdomen Inspection:-Inspection Normal.  Palpation/Perucssion: Palpation and Percussion of the abdomen reveal- Non Tender, No Rebound tenderness, No rigidity(Guarding) and No Palpable abdominal masses.  Liver:-Normal.  Spleen:- Normal.   .  Neurologic Cranial Nerve exam:- CN III-XII intact(No nystagmus), symmetric smile. Strength:- 5/5 equal and symmetric strength both upper and lower extremities.        Assessment & Plan:  Your strep test was positive. I am prescribing  cefdnir antibiotic. Rest hydrate, ibuprofen for fever and warm salt water gargles. Follow up in 7 days or as needed.  Use ibuprofen for pain/myalgia every 8 hours.  cefdnir will treat OM and sinus infection.  Your body aches, ha very likely strep related. If any further recurrent symptoms please notify us.  Follow up in 7 days or as needed   Note  right after interview started alarm for  building went off. It was extremely loud and I could not conduct interview. Noise was extremely loud and painful. Pt also appeared to be very  uncomfortable. I waited hoping alarm would stop but kept going but kept going off. So I offered to go down to bench just outside the building to conduct interview where we both could hear. Next 10 minutes conducted interview. No one else was around. Privacy of pt maintained. Her friend was only with her. She came to office with pt. When alarm went off so we sent back upstairs to finish interview, exam and visit.

## 2015-05-20 NOTE — Telephone Encounter (Signed)
Pt can not longer wait for amanda . Pt would like callback today from [facility] today. Please call [phone]

## 2015-05-20 NOTE — Telephone Encounter (Signed)
Pt seen on Monday and states she is getting worse. Has a really bad headache, which gets increasingly worse each each day.   Lots of congestion.  Pt taking afrin and does not help. Pt would like rx if possible sent to   [facility] / [address]

## 2015-05-20 NOTE — Patient Instructions (Addendum)
Your strep test was positive. I am prescribing  cefdnir antibiotic. Rest hydrate, ibuprofen for fever and warm salt water gargles. Follow up in 7 days or as needed.  Use ibuprofen for pain/myalgia every 8 hours.  cefdnir will treat OM and sinus infection.  Your body aches, ha very likely strep related. If any further recurrent symptoms please notify us.  Follow up in 7 days or as needed

## 2015-05-20 NOTE — Telephone Encounter (Signed)
Pt can not come in tomorrow. Pt would like to be seen today. Where can I work pt in at ?

## 2015-05-20 NOTE — Telephone Encounter (Signed)
Please help her get appt.t to reassess. dx with likely VURI a few days ago and abx will not help this but if feeling worse needs re-eval.

## 2015-05-25 NOTE — Telephone Encounter (Signed)
Pt was sch at [facility] with [facility] for 08/25 at 6:15PM. There was a fire drill/alarm that lasedt a bit but Ramon Dredge was able to see the patient and do a step test that was positive. I called patient on 05/25/15 and left a message for her to let her know I was following up on her visit and wanted to see that she was feeling better.

## 2015-06-10 ENCOUNTER — Encounter: Payer: Self-pay | Admitting: Family Medicine

## 2015-06-10 ENCOUNTER — Other Ambulatory Visit (HOSPITAL_COMMUNITY)
Admission: RE | Admit: 2015-06-10 | Discharge: 2015-06-10 | Disposition: A | Discharge status: Home / Self Care | Payer: BLUE CROSS/BLUE SHIELD | Source: Ambulatory Visit | Attending: Family Medicine | Admitting: Family Medicine

## 2015-06-10 ENCOUNTER — Ambulatory Visit (INDEPENDENT_AMBULATORY_CARE_PROVIDER_SITE_OTHER): Payer: BLUE CROSS/BLUE SHIELD | Admitting: Family Medicine

## 2015-06-10 VITALS — BP 100/72 | HR 88 | Temp 98.2°F | Ht 63.0 in | Wt 147.5 lb

## 2015-06-10 DIAGNOSIS — N760 Acute vaginitis: Secondary | ICD-10-CM | POA: Insufficient documentation

## 2015-06-10 DIAGNOSIS — Z113 Encounter for screening for infections with a predominantly sexual mode of transmission: Secondary | ICD-10-CM | POA: Insufficient documentation

## 2015-06-10 DIAGNOSIS — R300 Dysuria: Secondary | ICD-10-CM

## 2015-06-10 DIAGNOSIS — N898 Other specified noninflammatory disorders of vagina: Secondary | ICD-10-CM | POA: Diagnosis not present

## 2015-06-10 LAB — POCT URINALYSIS DIPSTICK
Bilirubin, UA: NEGATIVE
Glucose, UA: NEGATIVE
Ketones, UA: NEGATIVE
Leukocytes, UA: NEGATIVE
Nitrite, UA: NEGATIVE
Protein, UA: NEGATIVE
Spec Grav, UA: 1.025
Urobilinogen, UA: 0.2
pH, UA: 5.5

## 2015-06-10 LAB — URINALYSIS, MICROSCOPIC ONLY

## 2015-06-10 LAB — POCT URINE PREGNANCY: Preg Test, Ur: NEGATIVE

## 2015-06-10 NOTE — Progress Notes (Signed)
Pre visit review using our clinic review tool, if applicable. No additional management support is needed unless otherwise documented below in the visit note. 

## 2015-06-10 NOTE — Patient Instructions (Signed)
-  We have ordered labs or studies at this visit. It can take up to 1-2 weeks for results and processing. We will contact you with instructions IF your results are abnormal. Normal results will be released to your [facility]. If you have not heard from [place] or can not find your results in [facility] in 2 weeks please contact our office.  -please follow up with your doctor if any persistent symptoms

## 2015-06-10 NOTE — Progress Notes (Signed)
HPI:   Dysuria and vag discharge: -started about 1.5 weeks ago -burning with urinary, frequency, increased vag dicharge -denies: fevers, flank pain, nvd, odor, concern for STI -is sexually active -FDLMP about 1 month ago and shorter then usual  ROS: See pertinent positives and negatives per HPI.  Past Medical History  Diagnosis Date  . History of chickenpox   . Headache(784.0)     No past surgical history on file.  Family History  Problem Relation Age of Onset  . Hypertension Mother   . Diabetes Paternal Uncle   . Diabetes Maternal Grandmother   . Stroke Maternal Grandmother   . Stroke Maternal Grandfather   . Arthritis    . Osteoarthritis      Social History   Social History  . Marital Status: Married    Spouse Name: N/A  . Number of Children: N/A  . Years of Education: N/A   Social History Main Topics  . Smoking status: Former Smoker    Types: Cigarettes    Quit date: 05/09/2013  . Smokeless tobacco: None     Comment: Stopped 1 year ago  . Alcohol Use: Yes     Comment: occasional  . Drug Use: No  . Sexual Activity: Not Asked   Other Topics Concern  . 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  GENERAL: vitals reviewed and listed above, alert, oriented, appears well hydrated and in no acute distress  HEENT: atraumatic, conjunttiva clear, no obvious abnormalities on inspection of external nose and ears  NECK:  no obvious masses on inspection  LUNGS: clear to auscultation bilaterally, no wheezes, rales or rhonchi, good air movement  CV: HRRR, no peripheral edema  ABD: soft, NTTP  GU: normal appearance of ext genitalia, normal speculum exam, no CMT  MS: moves all extremities without noticeable abnormality  PSYCH: pleasant and cooperative, no obvious depression or anxiety  ASSESSMENT AND PLAN:  Discussed the following assessment and plan:  Dysuria - Plan: POC Urinalysis Dipstick, Culture, Urine, Urine Microscopic Only, POCT urine pregnancy  Vaginal discharge - Plan: Cervicovaginal ancillary only  -upreg, ua, ucx, gc/chlam, trich, yeast and BV testing pending -safe sexual practicies -Patient advised to return or notify a doctor immediately if symptoms worsen or persist or new concerns arise.  Patient Instructions  -We have ordered labs or studies at this visit. It can take up to 1-2 weeks for results and processing. We will contact you with instructions IF your results are abnormal. Normal results will be released to your [facility]. If you have not heard from [place] or can not find your results in [facility] in 2 weeks please contact our office.  -please follow up with your doctor if any persistent symptoms  Colin Benton R.

## 2015-06-11 LAB — CERVICOVAGINAL ANCILLARY ONLY
Chlamydia: NEGATIVE
Neisseria Gonorrhea: NEGATIVE
Trichomonas: NEGATIVE

## 2015-06-13 ENCOUNTER — Other Ambulatory Visit: Payer: Self-pay | Admitting: Family Medicine

## 2015-06-13 LAB — URINE CULTURE: Colony Count: >=100000

## 2015-06-13 MED ORDER — NITROFURANTOIN MONOHYD MACRO 100 MG PO CAPS: 100.0000 mg | ORAL_CAPSULE | Freq: Two times a day (BID) | ORAL | Status: DC

## 2015-06-14 LAB — CERVICOVAGINAL ANCILLARY ONLY
Bacterial vaginitis: NEGATIVE
Candida vaginitis: NEGATIVE

## 2016-01-03 ENCOUNTER — Ambulatory Visit (INDEPENDENT_AMBULATORY_CARE_PROVIDER_SITE_OTHER): Payer: BLUE CROSS/BLUE SHIELD | Admitting: Family Medicine

## 2016-01-03 ENCOUNTER — Encounter: Payer: Self-pay | Admitting: Family Medicine

## 2016-01-03 ENCOUNTER — Ambulatory Visit: Payer: BLUE CROSS/BLUE SHIELD | Admitting: Internal Medicine

## 2016-01-03 VITALS — BP 98/70 | HR 84 | Temp 98.7°F | Ht 63.0 in | Wt 153.9 lb

## 2016-01-03 DIAGNOSIS — J069 Acute upper respiratory infection, unspecified: Secondary | ICD-10-CM

## 2016-01-03 LAB — POCT RAPID STREP A (OFFICE): Rapid Strep A Screen: NEGATIVE

## 2016-01-03 MED ORDER — HYDROCODONE-HOMATROPINE 5-1.5 MG/5ML PO SYRP: 5.0000 mL | ORAL_SOLUTION | Freq: Three times a day (TID) | ORAL | Status: DC | PRN

## 2016-01-03 NOTE — Progress Notes (Signed)
HPI:  Acute visit for: URI: -started 2 days ago -symptoms: sore throat, nasal congestion, cough -denies: SOB, fever, nvd, body aches -cough is keeping her up and she wants codeine cough medication -no tick bites, no known flu or strep exposure  ROS: See pertinent positives and negatives per HPI.  Past Medical History  Diagnosis Date  . History of chickenpox   . Headache(784.0)     No past surgical history on file.  Family History  Problem Relation Age of Onset  . Hypertension Mother   . Diabetes Paternal Uncle   . Diabetes Maternal Grandmother   . Stroke Maternal Grandmother   . Stroke Maternal Grandfather   . Arthritis    . Osteoarthritis      Social History   Social History  . Marital Status: Married    Spouse Name: N/A  . Number of Children: N/A  . Years of Education: N/A   Social History Main Topics  . Smoking status: Former Smoker    Types: Cigarettes    Quit date: 05/09/2013  . Smokeless tobacco: None     Comment: Stopped 1 year ago  . Alcohol Use: Yes     Comment: occasional  . Drug Use: No  . Sexual Activity: Not Asked   Other Topics Concern  . 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  GuaiFENesin (MUCINEX PO), Take by mouth., Disp: , Rfl:  .  NON FORMULARY, Goody powder, Disp: , Rfl:  .  HYDROcodone-homatropine (HYCODAN) 5-1.5 MG/5ML syrup, Take 5 mLs by mouth every 8 (eight) hours as needed for cough., Disp: 120 mL, Rfl: 0  EXAM:  Filed Vitals:   01/03/16 1113  BP: 98/70  Pulse: 84  Temp: 98.7 F (37.1 C)    Body mass index is 27.27 kg/(m^2).  GENERAL: vitals reviewed and listed above, alert, oriented, appears well  hydrated and in no acute distress  HEENT: atraumatic, conjunttiva clear, no obvious abnormalities on inspection of external nose and ears, normal appearance of ear canals and TMs, clear nasal congestion, mild post oropharyngeal erythema with PND, no tonsillar edema or exudate, no sinus TTP  NECK: no obvious masses on inspection  LUNGS: clear to auscultation bilaterally, no wheezes, rales or rhonchi, good air movement  CV: HRRR, no peripheral edema  MS: moves all extremities without noticeable abnormality  PSYCH: pleasant and cooperative, no obvious depression or anxiety  ASSESSMENT AND PLAN:  Discussed the following assessment and plan:  Acute upper respiratory infection - Plan: POC Rapid Strep A  -rapid strep neg -likely VURI, discussed typical course, transmission, other potential etiologies, supportive care measures and return precuations. -discussed risks with codeine cough medication. -Patient advised to return or notify a doctor immediately if symptoms worsen or persist or new concerns arise.  Patient Instructions  INSTRUCTIONS FOR UPPER RESPIRATORY INFECTION:  -plenty of rest and fluids  -nasal saline wash 2-3 times daily (use prepackaged nasal saline or bottled/distilled water if making your own)   -can use AFRIN nasal  spray for drainage and nasal congestion - but do NOT use longer then 3-4 days  -can use tylenol (in no history of liver disease) or ibuprofen (if no history of kidney disease, bowel bleeding or significant heart disease) as directed for aches and sorethroat  -in the winter time, using a humidifier at night is helpful (please follow cleaning instructions)  -if you are taking a cough medication - use only as directed, may also try a teaspoon of honey to coat the throat and throat lozenges. If given a cough medication with codeine or hydrocodone or other narcotic please be advised that this contains a strong and  potentially addicting medication. Please  follow instructions carefully, take as little as possible and only use AS NEEDED for severe cough. Discuss potential side effects with your pharmacy. Please do not drive or operate machinery while taking these types of medications. Please do not take other sedating medications, drugs or alcohol while taking this medication without discussing with your doctor.  -for sore throat, salt water gargles can help  -follow up if you have fevers, facial pain, tooth pain, difficulty breathing or are worsening or symptoms persist longer then expected  Upper Respiratory Infection, Adult An upper respiratory infection (URI) is also known as the common cold. It is often caused by a type of germ (virus). Colds are easily spread (contagious). You can pass it to others by kissing, coughing, sneezing, or drinking out of the same glass. Usually, you get better in 1 to 3  weeks.  However, the cough can last for even longer. HOME CARE   Only take medicine as told by your doctor. Follow instructions provided above.  Drink enough water and fluids to keep your pee (urine) clear or pale yellow.  Get plenty of rest.  Return to work when your temperature is < 100 for 24 hours or as told by your doctor. You may use a face mask and wash your hands to stop your cold from spreading. GET HELP RIGHT AWAY IF:   After the first few days, you feel you are getting worse.  You have questions about your medicine.  You have chills, shortness of breath, or red spit (mucus).  You have pain in the face for more then 1-2 days, especially when you bend forward.  You have a fever, puffy (swollen) neck, pain when you swallow, or white spots in the back of your throat.  You have a bad headache, ear pain, sinus pain, or chest pain.  You have a high-pitched whistling sound when you breathe in and out (wheezing).  You cough up blood.  You have sore muscles or a stiff neck. MAKE SURE YOU:   Understand these instructions.  Will  watch your condition.  Will get help right away if you are not doing well or get worse. Document Released: 02/28/2008 Document Revised: 12/04/2011 Document Reviewed: 12/17/2013 [facility] Patient Information 2015 [place], [place]. This information is not intended to replace advice given to you by your health care provider. Make sure you discuss any questions you have with your health care provider.      Kriste Basque R.

## 2016-01-03 NOTE — Patient Instructions (Signed)

## 2016-01-03 NOTE — Progress Notes (Signed)
Pre visit review using our clinic review tool, if applicable. No additional management support is needed unless otherwise documented below in the visit note. 

## 2016-01-31 NOTE — Progress Notes (Signed)
Chief Complaint  Patient presents with  . Annual Exam    HPI: Patient  Kathy Steele  40 y.o. comes in today for Preventive Health Care visit   Had juice OJ. This am otherwise fasting  Seasonal headaches .   controlled  Health Maintenance  Topic Date Due  . HIV Screening  08/12/1993  . PAP SMEAR  10/26/2014  . INFLUENZA VACCINE  04/25/2016  . TETANUS/TDAP  07/06/2020   Health Maintenance Review LIFESTYLE:  Exercise:   Not reg just  Bought a treadmill.  Tobacco/ETS: no Alcohol: ocass to rare  Sugar beverages:  oj ocass  Tea some  aj Sleep:  8 hours  Drug use: no 40p hours per week  Hh of 3  Dog  Last  Pap here  Nl reg  Menses  Has blocked tubes   Since supplement 5 days . Period.    ROS:  GEN/ HEENT: No fever, significant weight changes sweats headaches vision problems hearing changes, CV/ PULM; No chest pain shortness of breath cough, syncope,edema  change in exercise tolerance. GI /GU: No adominal pain, vomiting, change in bowel habits. No blood in the stool. No significant GU symptoms. SKIN/HEME: ,no acute skin rashes suspicious lesions or bleeding. No lymphadenopathy, nodules, masses.  NEURO/ PSYCH:  No neurologic signs such as weakness numbness. No depression anxiety. IMM/ Allergy: No unusual infections.  Allergy .   REST of 12 system review negative except as per HPI   Past Medical History  Diagnosis Date  . History of chickenpox   . Headache(784.0)     No past surgical history on file.  Family History  Problem Relation Age of Onset  . Hypertension Mother   . Diabetes Paternal Uncle   . Diabetes Maternal Grandmother   . Stroke Maternal Grandmother   . Stroke Maternal Grandfather   . Arthritis    . Osteoarthritis      Social History   Social History  . Marital Status: Married    Spouse Name: N/A  . Number of Children: N/A  . Years of Education: N/A   Social History Main Topics  . Smoking status: Former Smoker    Types: Cigarettes   Quit date: 05/09/2013  . Smokeless tobacco: None     Comment: Stopped 1 year ago  . Alcohol Use: No     Comment: occasional  . Drug Use: No  . Sexual Activity: Not Asked   Other Topics Concern  . 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 GuaiFENesin (MUCINEX PO) Take by mouth. Reported on 02/01/2016    . HYDROcodone-homatropine (HYCODAN) 5-1.5 MG/5ML syrup Take 5 mLs by mouth every 8 (eight) hours as needed for cough. (Patient not taking: Reported on 02/01/2016) 120 mL 0  . NON FORMULARY Reported on 02/01/2016     No facility-administered medications prior to visit.     EXAM:  BP 102/74 mmHg  Pulse 73  Ht _0  (1.6 m)  Wt 150 lb 12.8 oz (68.402 kg)  BMI 26.72 kg/m2  SpO2 99%  LMP 01/30/2016  Body mass index is 26.72 kg/(m^2).  Physical Exam: Vital signs reviewed GNO:IBBC is a well-developed well-nourished alert cooperative    who appearsr stated age in no acute distress.  HEENT: normocephalic atraumatic , Eyes: PERRL EOM's full, conjunctiva clear, Nares: paten,t no deformity discharge or tenderness., Ears: no deformity EAC's clear TMs with normal landmarks. Mouth: clear OP, no lesions, edema.  Moist mucous membranes. Dentition in adequate repair. NECK: supple without masses, thyromegaly or bruits. CHEST/PULM:  Clear to auscultation and percussion breath sounds equal no wheeze , rales or rhonchi. No chest wall deformities or tenderness. CV: PMI is nondisplaced, S1 S2 no gallops, murmurs, rubs. Peripheral pulses are full without delay.No JVD . Breast: normal by inspection . No dimpling, discharge, masses, tenderness or discharge  . ABDOMEN: Bowel sounds normal nontender  No guard or rebound, no hepato splenomegal no CVA tenderness.  No hernia. Extremtities:  No clubbing cyanosis or edema, no acute joint swelling or redness no focal atrophy NEURO:  Oriented x3, cranial nerves 3-12 appear to be intact, no obvious focal weakness,gait within normal limits no abnormal reflexes or asymmetrical SKIN: No acute rashes normal turgor, color, no bruising or petechiae. PSYCH: Oriented, good eye contact, no obvious depression anxiety, cognition and judgment appear normal. LN: no cervical axillary inguinal adenopathy Pelvic: NL ext GU, labia clear without lesions or rash . Vagina no lesions .Cervix: clear  Nabothian cysts  UTERUS: Neg CMT Adnexa:  clear no masses . PAP done HR HPV   ASSESSMENT AND PLAN:  Discussed the following assessment and plan:  Visit for preventive health examination - Plan: CBC with Differential, Lipid panel, TSH, Comprehensive metabolic panel, Cytology - PAP  Encounter for routine gynecological examination - Plan: CANCELED: Cytology - PAP  Patient Care Team: Burnis Medin, MD as PCP - General Barrie Dunker, MD (Dermatology) Azucena Fallen, MD as Attending Physician (Obstetrics and Gynecology) Donneta Romberg, MD as Consulting Physician (Obstetrics and Gynecology) Patient Instructions  Will notify you  of labs PAP   when available. limit sugar beverages  Exercise of your choice . To sty healthly  Check up in a year or as needed   Health Maintenance, Female Adopting a healthy lifestyle and getting preventive care can go a long way to promote health and wellness. Talk with your health care provider about what schedule of regular examinations is right for you. This is a good chance for you to check in with your provider about disease prevention and staying healthy. In between checkups, there are plenty of things you can do on your own. Experts have done a lot of research about which lifestyle  changes and preventive measures are most likely to keep you healthy. Ask your health care provider for more information. WEIGHT AND DIET  Eat a healthy diet  Be sure to include plenty of vegetables, fruits, low-fat dairy products, and lean protein.  Do not eat a lot of foods high in solid fats, added sugars, or salt.  Get regular exercise. This is one of the most important things you can do for your health.  Most adults should exercise for at least 150 minutes each week. The exercise should increase your heart rate and make you sweat (moderate-intensity exercise).  Most adults should also do strengthening exercises at least twice a week. This is in addition to the moderate-intensity exercise.  Maintain a healthy weight  Body mass index (  BMI) is a measurement that can be used to identify possible weight problems. It estimates body fat based on height and weight. Your health care provider can help determine your BMI and help you achieve or maintain a healthy weight.  For females 59 years of age and older:   A BMI below 18.5 is considered underweight.  A BMI of 18.5 to 24.9 is normal.  A BMI of 25 to 29.9 is considered overweight.  A BMI of 30 and above is considered obese.  Watch levels of cholesterol and blood lipids  You should start having your blood tested for lipids and cholesterol at 39 years of age, then have this test every 5 years.  You may need to have your cholesterol levels checked more often if:  Your lipid or cholesterol levels are high.  You are older than 38 years of age.  You are at high risk for heart disease.  CANCER SCREENING   Lung Cancer  Lung cancer screening is recommended for adults 41-43 years old who are at high risk for lung cancer because of a history of smoking.  A yearly low-dose CT scan of the lungs is recommended for people who:  Currently smoke.  Have quit within the past 15 years.  Have at least a 30-pack-year history of smoking.  A pack year is smoking an average of one pack of cigarettes a day for 1 year.  Yearly screening should continue until it has been 15 years since you quit.  Yearly screening should stop if you develop a health problem that would prevent you from having lung cancer treatment.  Breast Cancer  Practice breast self-awareness. This means understanding how your breasts normally appear and feel.  It also means doing regular breast self-exams. Let your health care provider know about any changes, no matter how small.  If you are in your 20s or 30s, you should have a clinical breast exam (CBE) by a health care provider every 1-3 years as part of a regular health exam.  If you are 8 or older, have a CBE every year. Also consider having a breast X-ray (mammogram) every year.  If you have a family history of breast cancer, talk to your health care provider about genetic screening.  If you are at high risk for breast cancer, talk to your health care provider about having an MRI and a mammogram every year.  Breast cancer gene (BRCA) assessment is recommended for women who have family members with BRCA-related cancers. BRCA-related cancers include:  Breast.  Ovarian.  Tubal.  Peritoneal cancers.  Results of the assessment will determine the need for genetic counseling and BRCA1 and BRCA2 testing. Cervical Cancer Your health care provider may recommend that you be screened regularly for cancer of the pelvic organs (ovaries, uterus, and vagina). This screening involves a pelvic examination, including checking for microscopic changes to the surface of your cervix (Pap test). You may be encouraged to have this screening done every 3 years, beginning at age 35.  For women ages 43-65, health care providers may recommend pelvic exams and Pap testing every 3 years, or they may recommend the Pap and pelvic exam, combined with testing for human papilloma virus (HPV), every 5 years. Some types of HPV  increase your risk of cervical cancer. Testing for HPV may also be done on women of any age with unclear Pap test results.  Other health care providers may not recommend any screening for nonpregnant women who are considered low risk for  pelvic cancer and who do not have symptoms. Ask your health care provider if a screening pelvic exam is right for you.  If you have had past treatment for cervical cancer or a condition that could lead to cancer, you need Pap tests and screening for cancer for at least 20 years after your treatment. If Pap tests have been discontinued, your risk factors (such as having a new sexual partner) need to be reassessed to determine if screening should resume. Some women have medical problems that increase the chance of getting cervical cancer. In these cases, your health care provider may recommend more frequent screening and Pap tests. Colorectal Cancer  This type of cancer can be detected and often prevented.  Routine colorectal cancer screening usually begins at 38 years of age and continues through 38 years of age.  Your health care provider may recommend screening at an earlier age if you have risk factors for colon cancer.  Your health care provider may also recommend using home test kits to check for hidden blood in the stool.  A small camera at the end of a tube can be used to examine your colon directly (sigmoidoscopy or colonoscopy). This is done to check for the earliest forms of colorectal cancer.  Routine screening usually begins at age 36.  Direct examination of the colon should be repeated every 5-10 years through 38 years of age. However, you may need to be screened more often if early forms of precancerous polyps or small growths are found. Skin Cancer  Check your skin from head to toe regularly.  Tell your health care provider about any new moles or changes in moles, especially if there is a change in a mole's shape or color.  Also tell your  health care provider if you have a mole that is larger than the size of a pencil eraser.  Always use sunscreen. Apply sunscreen liberally and repeatedly throughout the day.  Protect yourself by wearing long sleeves, pants, a wide-brimmed hat, and sunglasses whenever you are outside. HEART DISEASE, DIABETES, AND HIGH BLOOD PRESSURE   High blood pressure causes heart disease and increases the risk of stroke. High blood pressure is more likely to develop in:  People who have blood pressure in the high end of the normal range (130-139/85-89 mm Hg).  People who are overweight or obese.  People who are African American.  If you are 2-55 years of age, have your blood pressure checked every 3-5 years. If you are 78 years of age or older, have your blood pressure checked every year. You should have your blood pressure measured twice--once when you are at a hospital or clinic, and once when you are not at a hospital or clinic. Record the average of the two measurements. To check your blood pressure when you are not at a hospital or clinic, you can use:  An automated blood pressure machine at a pharmacy.  A home blood pressure monitor.  If you are between 5 years and 74 years old, ask your health care provider if you should take aspirin to prevent strokes.  Have regular diabetes screenings. This involves taking a blood sample to check your fasting blood sugar level.  If you are at a normal weight and have a low risk for diabetes, have this test once every three years after 38 years of age.  If you are overweight and have a high risk for diabetes, consider being tested at a younger age or more often. PREVENTING INFECTION  Hepatitis B  If you have a higher risk for hepatitis B, you should be screened for this virus. You are considered at high risk for hepatitis B if:  You were born in a country where hepatitis B is common. Ask your health care provider which countries are considered high  risk.  Your parents were born in a high-risk country, and you have not been immunized against hepatitis B (hepatitis B vaccine).  You have HIV or AIDS.  You use needles to inject street drugs.  You live with someone who has hepatitis B.  You have had sex with someone who has hepatitis B.  You get hemodialysis treatment.  You take certain medicines for conditions, including cancer, organ transplantation, and autoimmune conditions. Hepatitis C  Blood testing is recommended for:  Everyone born from [birth year] through 1963.  Anyone with known risk factors for hepatitis C. Sexually transmitted infections (STIs)  You should be screened for sexually transmitted infections (STIs) including gonorrhea and chlamydia if:  You are sexually active and are younger than 38 years of age.  You are older than 38 years of age and your health care provider tells you that you are at risk for this type of infection.  Your sexual activity has changed since you were last screened and you are at an increased risk for chlamydia or gonorrhea. Ask your health care provider if you are at risk.  If you do not have HIV, but are at risk, it may be recommended that you take a prescription medicine daily to prevent HIV infection. This is called pre-exposure prophylaxis (PrEP). You are considered at risk if:  You are sexually active and do not regularly use condoms or know the HIV status of your partner(s).  You take drugs by injection.  You are sexually active with a partner who has HIV. Talk with your health care provider about whether you are at high risk of being infected with HIV. If you choose to begin PrEP, you should first be tested for HIV. You should then be tested every 3 months for as long as you are taking PrEP.  PREGNANCY   If you are premenopausal and you may become pregnant, ask your health care provider about preconception counseling.  If you may become pregnant, take 400 to 800 micrograms (mcg)  of folic acid every day.  If you want to prevent pregnancy, talk to your health care provider about birth control (contraception). OSTEOPOROSIS AND MENOPAUSE   Osteoporosis is a disease in which the bones lose minerals and strength with aging. This can result in serious bone fractures. Your risk for osteoporosis can be identified using a bone density scan.  If you are 21 years of age or older, or if you are at risk for osteoporosis and fractures, ask your health care provider if you should be screened.  Ask your health care provider whether you should take a calcium or vitamin D supplement to lower your risk for osteoporosis.  Menopause may have certain physical symptoms and risks.  Hormone replacement therapy may reduce some of these symptoms and risks. Talk to your health care provider about whether hormone replacement therapy is right for you.  HOME CARE INSTRUCTIONS   Schedule regular health, dental, and eye exams.  Stay current with your immunizations.   Do not use any tobacco products including cigarettes, chewing tobacco, or electronic cigarettes.  If you are pregnant, do not drink alcohol.  If you are breastfeeding, limit how much and how often you drink  alcohol.  Limit alcohol intake to no more than 1 drink per day for nonpregnant women. One drink equals 12 ounces of beer, 5 ounces of wine, or 1 ounces of hard liquor.  Do not use street drugs.  Do not share needles.  Ask your health care provider for help if you need support or information about quitting drugs.  Tell your health care provider if you often feel depressed.  Tell your health care provider if you have ever been abused or do not feel safe at home.   This information is not intended to replace advice given to you by your health care provider. Make sure you discuss any questions you have with your health care provider.   Document Released: 03/27/2011 Document Revised: 10/02/2014 Document Reviewed:  08/13/2013 Elsevier Interactive Patient Education 2016 [place]. Chester Romero M.D.

## 2016-02-01 ENCOUNTER — Ambulatory Visit (INDEPENDENT_AMBULATORY_CARE_PROVIDER_SITE_OTHER): Payer: BLUE CROSS/BLUE SHIELD | Admitting: Internal Medicine

## 2016-02-01 ENCOUNTER — Other Ambulatory Visit (HOSPITAL_COMMUNITY)
Admission: RE | Admit: 2016-02-01 | Discharge: 2016-02-01 | Disposition: A | Discharge status: Home / Self Care | Payer: BLUE CROSS/BLUE SHIELD | Source: Ambulatory Visit | Attending: Internal Medicine | Admitting: Internal Medicine

## 2016-02-01 ENCOUNTER — Encounter: Payer: Self-pay | Admitting: Internal Medicine

## 2016-02-01 VITALS — BP 102/74 | HR 73 | Ht 63.0 in | Wt 150.8 lb

## 2016-02-01 DIAGNOSIS — Z01411 Encounter for gynecological examination (general) (routine) with abnormal findings: Secondary | ICD-10-CM | POA: Insufficient documentation

## 2016-02-01 DIAGNOSIS — Z01419 Encounter for gynecological examination (general) (routine) without abnormal findings: Secondary | ICD-10-CM

## 2016-02-01 DIAGNOSIS — Z0000 Encounter for general adult medical examination without abnormal findings: Secondary | ICD-10-CM | POA: Diagnosis not present

## 2016-02-01 DIAGNOSIS — Z1151 Encounter for screening for human papillomavirus (HPV): Secondary | ICD-10-CM | POA: Insufficient documentation

## 2016-02-01 LAB — CBC WITH DIFFERENTIAL/PLATELET
Basophils Absolute: 0 10*3/uL (ref 0.0–0.1)
Basophils Relative: 0.3 % (ref 0.0–3.0)
Eosinophils Absolute: 0 10*3/uL (ref 0.0–0.7)
Eosinophils Relative: 0.8 % (ref 0.0–5.0)
HCT: 38.9 % (ref 36.0–46.0)
Hemoglobin: 13 g/dL (ref 12.0–15.0)
Lymphocytes Relative: 35.1 % (ref 12.0–46.0)
Lymphs Abs: 1.7 10*3/uL (ref 0.7–4.0)
MCHC: 33.4 g/dL (ref 30.0–36.0)
MCV: 91.1 fl (ref 78.0–100.0)
Monocytes Absolute: 0.4 10*3/uL (ref 0.1–1.0)
Monocytes Relative: 7.9 % (ref 3.0–12.0)
Neutro Abs: 2.7 10*3/uL (ref 1.4–7.7)
Neutrophils Relative %: 55.9 % (ref 43.0–77.0)
Platelets: 369 10*3/uL (ref 150.0–400.0)
RBC: 4.27 Mil/uL (ref 3.87–5.11)
RDW: 13.1 % (ref 11.5–15.5)
WBC: 4.9 10*3/uL (ref 4.0–10.5)

## 2016-02-01 LAB — COMPREHENSIVE METABOLIC PANEL
ALT: 10 U/L (ref 0–35)
AST: 15 U/L (ref 0–37)
Albumin: 4.5 g/dL (ref 3.5–5.2)
Alkaline Phosphatase: 50 U/L (ref 39–117)
BUN: 12 mg/dL (ref 6–23)
CO2: 28 mEq/L (ref 19–32)
Calcium: 9.4 mg/dL (ref 8.4–10.5)
Chloride: 103 mEq/L (ref 96–112)
Creatinine, Ser: 0.65 mg/dL (ref 0.40–1.20)
GFR: 131.56 mL/min (ref >60.00)
Glucose, Bld: 87 mg/dL (ref 70–99)
Potassium: 3.9 mEq/L (ref 3.5–5.1)
Sodium: 139 mEq/L (ref 135–145)
Total Bilirubin: 0.4 mg/dL (ref 0.2–1.2)
Total Protein: 7.9 g/dL (ref 6.0–8.3)

## 2016-02-01 LAB — LIPID PANEL
Cholesterol: 238 mg/dL — ABNORMAL HIGH (ref 0–200)
HDL: 66.6 mg/dL (ref >39.00)
LDL Cholesterol: 156 mg/dL — ABNORMAL HIGH (ref 0–99)
NonHDL: 171.02
Total CHOL/HDL Ratio: 4
Triglycerides: 74 mg/dL (ref 0.0–149.0)
VLDL: 14.8 mg/dL (ref 0.0–40.0)

## 2016-02-01 LAB — TSH: TSH: 0.88 u[IU]/mL (ref 0.35–4.50)

## 2016-02-01 NOTE — Patient Instructions (Addendum)
Will notify you  of labs PAP   when available. limit sugar beverages  Exercise of your choice . To sty healthly  Check up in a year or as needed   Health Maintenance, Female Adopting a healthy lifestyle and getting preventive care can go a long way to promote health and wellness. Talk with your health care provider about what schedule of regular examinations is right for you. This is a good chance for you to check in with your provider about disease prevention and staying healthy. In between checkups, there are plenty of things you can do on your own. Experts have done a lot of research about which lifestyle changes and preventive measures are most likely to keep you healthy. Ask your health care provider for more information. WEIGHT AND DIET  Eat a healthy diet  Be sure to include plenty of vegetables, fruits, low-fat dairy products, and lean protein.  Do not eat a lot of foods high in solid fats, added sugars, or salt.  Get regular exercise. This is one of the most important things you can do for your health.  Most adults should exercise for at least 150 minutes each week. The exercise should increase your heart rate and make you sweat (moderate-intensity exercise).  Most adults should also do strengthening exercises at least twice a week. This is in addition to the moderate-intensity exercise.  Maintain a healthy weight  Body mass index (BMI) is a measurement that can be used to identify possible weight problems. It estimates body fat based on height and weight. Your health care provider can help determine your BMI and help you achieve or maintain a healthy weight.  For females 30 years of age and older:   A BMI below 18.5 is considered underweight.  A BMI of 18.5 to 24.9 is normal.  A BMI of 25 to 29.9 is considered overweight.  A BMI of 30 and above is considered obese.  Watch levels of cholesterol and blood lipids  You should start having your blood tested for lipids and  cholesterol at 38 years of age, then have this test every 5 years.  You may need to have your cholesterol levels checked more often if:  Your lipid or cholesterol levels are high.  You are older than 38 years of age.  You are at high risk for heart disease.  CANCER SCREENING   Lung Cancer  Lung cancer screening is recommended for adults 49-35 years old who are at high risk for lung cancer because of a history of smoking.  A yearly low-dose CT scan of the lungs is recommended for people who:  Currently smoke.  Have quit within the past 15 years.  Have at least a 30-pack-year history of smoking. A pack year is smoking an average of one pack of cigarettes a day for 1 year.  Yearly screening should continue until it has been 15 years since you quit.  Yearly screening should stop if you develop a health problem that would prevent you from having lung cancer treatment.  Breast Cancer  Practice breast self-awareness. This means understanding how your breasts normally appear and feel.  It also means doing regular breast self-exams. Let your health care provider know about any changes, no matter how small.  If you are in your 20s or 30s, you should have a clinical breast exam (CBE) by a health care provider every 1-3 years as part of a regular health exam.  If you are 75 or older, have a  CBE every year. Also consider having a breast X-ray (mammogram) every year.  If you have a family history of breast cancer, talk to your health care provider about genetic screening.  If you are at high risk for breast cancer, talk to your health care provider about having an MRI and a mammogram every year.  Breast cancer gene (BRCA) assessment is recommended for women who have family members with BRCA-related cancers. BRCA-related cancers include:  Breast.  Ovarian.  Tubal.  Peritoneal cancers.  Results of the assessment will determine the need for genetic counseling and BRCA1 and BRCA2  testing. Cervical Cancer Your health care provider may recommend that you be screened regularly for cancer of the pelvic organs (ovaries, uterus, and vagina). This screening involves a pelvic examination, including checking for microscopic changes to the surface of your cervix (Pap test). You may be encouraged to have this screening done every 3 years, beginning at age 37.  For women ages 52-65, health care providers may recommend pelvic exams and Pap testing every 3 years, or they may recommend the Pap and pelvic exam, combined with testing for human papilloma virus (HPV), every 5 years. Some types of HPV increase your risk of cervical cancer. Testing for HPV may also be done on women of any age with unclear Pap test results.  Other health care providers may not recommend any screening for nonpregnant women who are considered low risk for pelvic cancer and who do not have symptoms. Ask your health care provider if a screening pelvic exam is right for you.  If you have had past treatment for cervical cancer or a condition that could lead to cancer, you need Pap tests and screening for cancer for at least 20 years after your treatment. If Pap tests have been discontinued, your risk factors (such as having a new sexual partner) need to be reassessed to determine if screening should resume. Some women have medical problems that increase the chance of getting cervical cancer. In these cases, your health care provider may recommend more frequent screening and Pap tests. Colorectal Cancer  This type of cancer can be detected and often prevented.  Routine colorectal cancer screening usually begins at 38 years of age and continues through 38 years of age.  Your health care provider may recommend screening at an earlier age if you have risk factors for colon cancer.  Your health care provider may also recommend using home test kits to check for hidden blood in the stool.  A small camera at the end of a  tube can be used to examine your colon directly (sigmoidoscopy or colonoscopy). This is done to check for the earliest forms of colorectal cancer.  Routine screening usually begins at age 3.  Direct examination of the colon should be repeated every 5-10 years through 38 years of age. However, you may need to be screened more often if early forms of precancerous polyps or small growths are found. Skin Cancer  Check your skin from head to toe regularly.  Tell your health care provider about any new moles or changes in moles, especially if there is a change in a mole's shape or color.  Also tell your health care provider if you have a mole that is larger than the size of a pencil eraser.  Always use sunscreen. Apply sunscreen liberally and repeatedly throughout the day.  Protect yourself by wearing long sleeves, pants, a wide-brimmed hat, and sunglasses whenever you are outside. HEART DISEASE, DIABETES, AND HIGH BLOOD  PRESSURE   High blood pressure causes heart disease and increases the risk of stroke. High blood pressure is more likely to develop in:  People who have blood pressure in the high end of the normal range (130-139/85-89 mm Hg).  People who are overweight or obese.  People who are African American.  If you are 43-58 years of age, have your blood pressure checked every 3-5 years. If you are 9 years of age or older, have your blood pressure checked every year. You should have your blood pressure measured twice--once when you are at a hospital or clinic, and once when you are not at a hospital or clinic. Record the average of the two measurements. To check your blood pressure when you are not at a hospital or clinic, you can use:  An automated blood pressure machine at a pharmacy.  A home blood pressure monitor.  If you are between 43 years and 25 years old, ask your health care provider if you should take aspirin to prevent strokes.  Have regular diabetes screenings. This  involves taking a blood sample to check your fasting blood sugar level.  If you are at a normal weight and have a low risk for diabetes, have this test once every three years after 38 years of age.  If you are overweight and have a high risk for diabetes, consider being tested at a younger age or more often. PREVENTING INFECTION  Hepatitis B  If you have a higher risk for hepatitis B, you should be screened for this virus. You are considered at high risk for hepatitis B if:  You were born in a country where hepatitis B is common. Ask your health care provider which countries are considered high risk.  Your parents were born in a high-risk country, and you have not been immunized against hepatitis B (hepatitis B vaccine).  You have HIV or AIDS.  You use needles to inject street drugs.  You live with someone who has hepatitis B.  You have had sex with someone who has hepatitis B.  You get hemodialysis treatment.  You take certain medicines for conditions, including cancer, organ transplantation, and autoimmune conditions. Hepatitis C  Blood testing is recommended for:  Everyone born from [birth year] through 1963.  Anyone with known risk factors for hepatitis C. Sexually transmitted infections (STIs)  You should be screened for sexually transmitted infections (STIs) including gonorrhea and chlamydia if:  You are sexually active and are younger than 38 years of age.  You are older than 38 years of age and your health care provider tells you that you are at risk for this type of infection.  Your sexual activity has changed since you were last screened and you are at an increased risk for chlamydia or gonorrhea. Ask your health care provider if you are at risk.  If you do not have HIV, but are at risk, it may be recommended that you take a prescription medicine daily to prevent HIV infection. This is called pre-exposure prophylaxis (PrEP). You are considered at risk if:  You are  sexually active and do not regularly use condoms or know the HIV status of your partner(s).  You take drugs by injection.  You are sexually active with a partner who has HIV. Talk with your health care provider about whether you are at high risk of being infected with HIV. If you choose to begin PrEP, you should first be tested for HIV. You should then be tested every 3  months for as long as you are taking PrEP.  PREGNANCY   If you are premenopausal and you may become pregnant, ask your health care provider about preconception counseling.  If you may become pregnant, take 400 to 800 micrograms (mcg) of folic acid every day.  If you want to prevent pregnancy, talk to your health care provider about birth control (contraception). OSTEOPOROSIS AND MENOPAUSE   Osteoporosis is a disease in which the bones lose minerals and strength with aging. This can result in serious bone fractures. Your risk for osteoporosis can be identified using a bone density scan.  If you are 52 years of age or older, or if you are at risk for osteoporosis and fractures, ask your health care provider if you should be screened.  Ask your health care provider whether you should take a calcium or vitamin D supplement to lower your risk for osteoporosis.  Menopause may have certain physical symptoms and risks.  Hormone replacement therapy may reduce some of these symptoms and risks. Talk to your health care provider about whether hormone replacement therapy is right for you.  HOME CARE INSTRUCTIONS   Schedule regular health, dental, and eye exams.  Stay current with your immunizations.   Do not use any tobacco products including cigarettes, chewing tobacco, or electronic cigarettes.  If you are pregnant, do not drink alcohol.  If you are breastfeeding, limit how much and how often you drink alcohol.  Limit alcohol intake to no more than 1 drink per day for nonpregnant women. One drink equals 12 ounces of beer, 5  ounces of wine, or 1 ounces of hard liquor.  Do not use street drugs.  Do not share needles.  Ask your health care provider for help if you need support or information about quitting drugs.  Tell your health care provider if you often feel depressed.  Tell your health care provider if you have ever been abused or do not feel safe at home.   This information is not intended to replace advice given to you by your health care provider. Make sure you discuss any questions you have with your health care provider.   Document Released: 03/27/2011 Document Revised: 10/02/2014 Document Reviewed: 08/13/2013 Elsevier Interactive Patient Education [employer].

## 2016-02-04 LAB — CYTOLOGY - PAP

## 2016-02-10 ENCOUNTER — Encounter: Payer: Self-pay | Admitting: Family Medicine

## 2016-02-22 ENCOUNTER — Telehealth: Payer: Self-pay | Admitting: Family Medicine

## 2016-02-22 NOTE — Telephone Encounter (Signed)
Left a message informing the pt that she does not need a referral to see gyn.  Advised she call back with any questions.

## 2016-02-22 NOTE — Telephone Encounter (Signed)
-----   Message from Garrison Columbusynthia D Boyd, RN sent at 02/17/2016  9:29 AM EDT ----- Regarding: Referral Lab results were reported to patient.  Patient wants to know if she can get a referral to Dr. Dereck LigasSharonette Cousins in regards to her PAP results.  Please contact patient

## 2016-02-24 ENCOUNTER — Other Ambulatory Visit: Payer: Self-pay | Admitting: Family Medicine

## 2016-02-24 DIAGNOSIS — R87619 Unspecified abnormal cytological findings in specimens from cervix uteri: Secondary | ICD-10-CM

## 2016-03-02 ENCOUNTER — Telehealth: Payer: Self-pay | Admitting: *Deleted

## 2016-03-02 NOTE — Telephone Encounter (Signed)
[facility] on [facility].  Pap results were faxed to [phone].

## 2017-02-05 ENCOUNTER — Encounter: Payer: Self-pay | Admitting: Internal Medicine

## 2017-06-15 ENCOUNTER — Encounter: Payer: Self-pay | Admitting: Internal Medicine

## 2018-06-14 DIAGNOSIS — Z3202 Encounter for pregnancy test, result negative: Secondary | ICD-10-CM | POA: Diagnosis not present

## 2018-06-14 DIAGNOSIS — R87619 Unspecified abnormal cytological findings in specimens from cervix uteri: Secondary | ICD-10-CM | POA: Diagnosis not present

## 2018-06-14 DIAGNOSIS — L918 Other hypertrophic disorders of the skin: Secondary | ICD-10-CM | POA: Diagnosis not present

## 2018-06-14 DIAGNOSIS — L700 Acne vulgaris: Secondary | ICD-10-CM | POA: Diagnosis not present

## 2018-06-14 DIAGNOSIS — N72 Inflammatory disease of cervix uteri: Secondary | ICD-10-CM | POA: Diagnosis not present

## 2018-07-29 NOTE — Progress Notes (Signed)
Chief Complaint  Patient presents with  . Annual Exam    Increased fatigue    HPI: Patient  Kathy Steele  42 y.o. comes in today for Preventive Health Care visit  Last p v  was  2017   Sees gyne and    Abn pap cx bx was benign  Antibiotic given  f or inflammation.   Co of fatigue like tierd at times  Not related to mood  Or cv pulm sx   Gets 6+ hours sleep  Some activity  Tries to sleep more on weekend  No excess bleeding  fam hx of thryoid disease  Had rx for ? Vit d in past when ahd fatigue in a rahs.  Non restrictive diet.   Health Maintenance  Topic Date Due  . HIV Screening  08/12/1993  . INFLUENZA VACCINE  04/25/2018  . PAP SMEAR  02/01/2019  . TETANUS/TDAP  07/06/2020   Health Maintenance Review LIFESTYLE:  Exercise:  Walks dog and runa t work  Tobacco/ETS:  no Alcohol:  no Sugar beverages: every juice   Some tea.  Sleep: 6-7  Drug use: no HH of   3  One pet dog  Work:  50 +   Hours per week   ROS:  GEN/ HEENT: No fever, significant weight changes sweats headaches vision problems hearing changes, CV/ PULM; No chest pain shortness of breath cough, syncope,edema  change in exercise tolerance. GI /GU: No adominal pain, vomiting, change in bowel habits. No blood in the stool. No significant GU symptoms. SKIN/HEME: ,no acute skin rashes suspicious lesions or bleeding. No lymphadenopathy, nodules, masses.  NEURO/ PSYCH:  No neurologic signs such as weakness numbness. No depression anxiety. IMM/ Allergy: No unusual infections.  Allergy .   REST of 12 system review negative except as per HPI   Past Medical History:  Diagnosis Date  . Headache(784.0)   . History of chickenpox     No past surgical history on file.  Family History  Problem Relation Age of Onset  . Hypertension Mother   . Diabetes Paternal Uncle   . Diabetes Maternal Grandmother   . Stroke Maternal Grandmother   . Stroke Maternal Grandfather   . Arthritis Unknown   . Osteoarthritis  Unknown     Social History   Socioeconomic History  . Marital status: Married    Spouse name: Not on file  . Number of children: Not on file  . Years of education: Not on file  . Highest education level: Not on file  Occupational History  . Not on file  Social Needs  . Financial resource strain: Not on file  . Food insecurity:    Worry: Not on file    Inability: Not on file  . Transportation needs:    Medical: Not on file    Non-medical: Not on file  Tobacco Use  . Smoking status: Former Smoker    Types: Cigarettes    Last attempt to quit: 05/09/2013    Years since quitting: 5.2  . Tobacco comment: Stopped 1 year ago  Substance and Sexual Activity  . Alcohol use: No    Alcohol/week: 0.0 standard drinks    Comment: occasional  . Drug use: No  . Sexual activity: Not on file  Lifestyle  . Physical activity:    Days per week: Not on file    Minutes per session: Not on file  . Stress: Not on file  Relationships  . Social connections:  Talks on phone: Not on file    Gets together: Not on file    Attends religious service: Not on file    Active member of club or organization: Not on file    Attends meetings of clubs or organizations: Not on file    Relationship status: Not on file  Other Topics Concern  . 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 cetirizine (ZYRTEC) 10 MG tablet Take 10 mg by mouth daily.     No facility-administered medications prior to visit.      EXAM:  BP 98/60 (BP Location: Right Arm, Patient Position: Sitting, Cuff Size: Normal)   Pulse 80   Temp 98.2 F (36.8 C) (Oral)   Ht 5' 2.5" (1.588  m)   Wt 148 lb 14.4 oz (67.5 kg)   BMI 26.80 kg/m   Body mass index is 26.8 kg/m. Wt Readings from Last 3 Encounters:  07/30/18 148 lb 14.4 oz (67.5 kg)  02/01/16 150 lb 12.8 oz (68.4 kg)  01/03/16 153 lb 14.4 oz (69.8 kg)    Physical Exam: Vital signs reviewed BJY:NWGN is a well-developed well-nourished alert cooperative    who appearsr stated age in no acute distress.  HEENT: normocephalic atraumatic , Eyes: PERRL EOM's full, conjunctiva clear, Nares: paten,t no deformity discharge or tenderness., Ears: no deformity EAC's clear TMs with normal landmarks. Mouth: clear OP, no lesions, edema.  Moist mucous membranes. Dentition in adequate repair. NECK: supple without masses, thyromegaly or bruits. CHEST/PULM:  Clear to auscultation and percussion breath sounds equal no wheeze , rales or rhonchi. No chest wall deformities or tenderness. Breast: normal by inspection . No dimpling, discharge, masses, tenderness or discharge . CV: PMI is nondisplaced, S1 S2 no gallops, murmurs, rubs. Peripheral pulses are full without delay.No JVD .  ABDOMEN: Bowel sounds normal nontender  No guard or rebound, no hepato splenomegal no CVA tenderness.  No hernia. Extremtities:  No clubbing cyanosis or edema, no acute joint swelling or redness no focal atrophy NEURO:  Oriented x3, cranial nerves 3-12 appear to be intact, no obvious focal weakness,gait within normal limits no abnormal reflexes or asymmetrical SKIN: No acute rashes normal turgor, color, no bruising or petechiae. PSYCH: Oriented, good eye contact, no obvious depression anxiety, cognition and judgment appear normal. LN: no cervical axillary inguinal adenopathy  Lab Results  Component Value Date   WBC 4.9 02/01/2016   HGB 13.0 02/01/2016   HCT 38.9 02/01/2016   PLT 369.0 02/01/2016   GLUCOSE 87 02/01/2016   CHOL 238 (H) 02/01/2016   TRIG 74.0 02/01/2016   HDL 66.60 02/01/2016   LDLDIRECT 131.6 09/09/2013   LDLCALC 156 (H) 02/01/2016   ALT  10 02/01/2016   AST 15 02/01/2016   NA 139 02/01/2016   K 3.9 02/01/2016   CL 103 02/01/2016   CREATININE 0.65 02/01/2016   BUN 12 02/01/2016   CO2 28 02/01/2016   TSH 0.88 02/01/2016    BP Readings from  Last 3 Encounters:  07/30/18 98/60  02/01/16 102/74  01/03/16 98/70    Lab plan reviewed with patient  te salad at lunch   ASSESSMENT AND PLAN:  Discussed the following assessment and plan:  Visit for preventive health examination - Plan: Basic metabolic panel, CBC with Differential/Platelet, Hepatic function panel, Lipid panel, TSH, T4, free  Medication management - Plan: Basic metabolic panel, CBC with Differential/Platelet, Hepatic function panel, Lipid panel, TSH, T4, free  Need for influenza vaccination - Plan: Flu Vaccine QUAD 6+ mos PF IM (Fluarix Quad PF)  Other fatigue - Plan: Basic metabolic panel, CBC with Differential/Platelet, Hepatic function panel, Lipid panel, TSH, T4, free, T3, free Suspect related to  inadquate sleep but checking metabolic  Etc.   Patient Care Team: Janijah Symons, Standley Brooking, MD as PCP - General Barrie Dunker, MD (Dermatology) Azucena Fallen, MD as Attending Physician (Obstetrics and Gynecology) Donneta Romberg, MD (Inactive) as Consulting Physician (Obstetrics and Gynecology) Patient Instructions  Optimize sleep 7.5-8 hours best   Checking for thyroid and anemia .   Sleep deprivation is a common cause of fatigue.     Preventive Care 40-64 Years, Female Preventive care refers to lifestyle choices and visits with your health care provider that can promote health and wellness. What does preventive care include?  A yearly physical exam. This is also called an annual well check.  Dental exams once or twice a year.  Routine eye exams. Ask your health care provider how often you should have your eyes checked.  Personal lifestyle choices, including: ? Daily care of your teeth and gums. ? Regular physical activity. ? Eating a  healthy diet. ? Avoiding tobacco and drug use. ? Limiting alcohol use. ? Practicing safe sex. ? Taking low-dose aspirin daily starting at age 40. ? Taking vitamin and mineral supplements as recommended by your health care provider. What happens during an annual well check? The services and screenings done by your health care provider during your annual well check will depend on your age, overall health, lifestyle risk factors, and family history of disease. Counseling Your health care provider may ask you questions about your:  Alcohol use.  Tobacco use.  Drug use.  Emotional well-being.  Home and relationship well-being.  Sexual activity.  Eating habits.  Work and work Statistician.  Method of birth control.  Menstrual cycle.  Pregnancy history.  Screening You may have the following tests or measurements:  Height, weight, and BMI.  Blood pressure.  Lipid and cholesterol levels. These may be checked every 5 years, or more frequently if you are over 61 years old.  Skin check.  Lung cancer screening. You may have this screening every year starting at age 18 if you have a 30-pack-year history of smoking and currently smoke or have quit within the past 15 years.  Fecal occult blood test (FOBT) of the stool. You may have this test every year starting at age 86.  Flexible sigmoidoscopy or colonoscopy. You may have a sigmoidoscopy every 5 years or a colonoscopy every 10 years starting at age 43.  Hepatitis C blood test.  Hepatitis B blood test.  Sexually transmitted disease (STD) testing.  Diabetes screening. This is done by checking your blood sugar (glucose) after you have not eaten for a while (fasting). You may have this done every 1-3 years.  Mammogram. This may be done every 1-2 years. Talk to your health care provider about when you should start having regular mammograms. This may depend on whether  you have a family history of breast cancer.  BRCA-related  cancer screening. This may be done if you have a family history of breast, ovarian, tubal, or peritoneal cancers.  Pelvic exam and Pap test. This may be done every 3 years starting at age 9. Starting at age 45, this may be done every 5 years if you have a Pap test in combination with an HPV test.  Bone density scan. This is done to screen for osteoporosis. You may have this scan if you are at high risk for osteoporosis.  Discuss your test results, treatment options, and if necessary, the need for more tests with your health care provider. Vaccines Your health care provider may recommend certain vaccines, such as:  Influenza vaccine. This is recommended every year.  Tetanus, diphtheria, and acellular pertussis (Tdap, Td) vaccine. You may need a Td booster every 10 years.  Varicella vaccine. You may need this if you have not been vaccinated.  Zoster vaccine. You may need this after age 22.  Measles, mumps, and rubella (MMR) vaccine. You may need at least one dose of MMR if you were born in 1957 or later. You may also need a second dose.  Pneumococcal 13-valent conjugate (PCV13) vaccine. You may need this if you have certain conditions and were not previously vaccinated.  Pneumococcal polysaccharide (PPSV23) vaccine. You may need one or two doses if you smoke cigarettes or if you have certain conditions.  Meningococcal vaccine. You may need this if you have certain conditions.  Hepatitis A vaccine. You may need this if you have certain conditions or if you travel or work in places where you may be exposed to hepatitis A.  Hepatitis B vaccine. You may need this if you have certain conditions or if you travel or work in places where you may be exposed to hepatitis B.  Haemophilus influenzae type b (Hib) vaccine. You may need this if you have certain conditions.  Talk to your health care provider about which screenings and vaccines you need and how often you need them. This information is  not intended to replace advice given to you by your health care provider. Make sure you discuss any questions you have with your health care provider. Document Released: 10/08/2015 Document Revised: 05/31/2016 Document Reviewed: 07/13/2015 Elsevier Interactive Patient Education  2018 [place]. Mieke Brinley M.D.

## 2018-07-30 ENCOUNTER — Ambulatory Visit (INDEPENDENT_AMBULATORY_CARE_PROVIDER_SITE_OTHER): Payer: BLUE CROSS/BLUE SHIELD | Admitting: Internal Medicine

## 2018-07-30 ENCOUNTER — Encounter: Payer: Self-pay | Admitting: Internal Medicine

## 2018-07-30 VITALS — BP 98/60 | HR 80 | Temp 98.2°F | Ht 62.5 in | Wt 148.9 lb

## 2018-07-30 DIAGNOSIS — R5383 Other fatigue: Secondary | ICD-10-CM

## 2018-07-30 DIAGNOSIS — Z79899 Other long term (current) drug therapy: Secondary | ICD-10-CM

## 2018-07-30 DIAGNOSIS — Z23 Encounter for immunization: Secondary | ICD-10-CM

## 2018-07-30 DIAGNOSIS — Z0000 Encounter for general adult medical examination without abnormal findings: Secondary | ICD-10-CM

## 2018-07-30 NOTE — Patient Instructions (Addendum)
Optimize sleep 7.5-8 hours best   Checking for thyroid and anemia .   Sleep deprivation is a common cause of fatigue.     Preventive Care 40-64 Years, Female Preventive care refers to lifestyle choices and visits with your health care provider that can promote health and wellness. What does preventive care include?  A yearly physical exam. This is also called an annual well check.  Dental exams once or twice a year.  Routine eye exams. Ask your health care provider how often you should have your eyes checked.  Personal lifestyle choices, including: ? Daily care of your teeth and gums. ? Regular physical activity. ? Eating a healthy diet. ? Avoiding tobacco and drug use. ? Limiting alcohol use. ? Practicing safe sex. ? Taking low-dose aspirin daily starting at age 50. ? Taking vitamin and mineral supplements as recommended by your health care provider. What happens during an annual well check? The services and screenings done by your health care provider during your annual well check will depend on your age, overall health, lifestyle risk factors, and family history of disease. Counseling Your health care provider may ask you questions about your:  Alcohol use.  Tobacco use.  Drug use.  Emotional well-being.  Home and relationship well-being.  Sexual activity.  Eating habits.  Work and work Statistician.  Method of birth control.  Menstrual cycle.  Pregnancy history.  Screening You may have the following tests or measurements:  Height, weight, and BMI.  Blood pressure.  Lipid and cholesterol levels. These may be checked every 5 years, or more frequently if you are over 24 years old.  Skin check.  Lung cancer screening. You may have this screening every year starting at age 47 if you have a 30-pack-year history of smoking and currently smoke or have quit within the past 15 years.  Fecal occult blood test (FOBT) of the stool. You may have this test every  year starting at age 24.  Flexible sigmoidoscopy or colonoscopy. You may have a sigmoidoscopy every 5 years or a colonoscopy every 10 years starting at age 70.  Hepatitis C blood test.  Hepatitis B blood test.  Sexually transmitted disease (STD) testing.  Diabetes screening. This is done by checking your blood sugar (glucose) after you have not eaten for a while (fasting). You may have this done every 1-3 years.  Mammogram. This may be done every 1-2 years. Talk to your health care provider about when you should start having regular mammograms. This may depend on whether you have a family history of breast cancer.  BRCA-related cancer screening. This may be done if you have a family history of breast, ovarian, tubal, or peritoneal cancers.  Pelvic exam and Pap test. This may be done every 3 years starting at age 78. Starting at age 41, this may be done every 5 years if you have a Pap test in combination with an HPV test.  Bone density scan. This is done to screen for osteoporosis. You may have this scan if you are at high risk for osteoporosis.  Discuss your test results, treatment options, and if necessary, the need for more tests with your health care provider. Vaccines Your health care provider may recommend certain vaccines, such as:  Influenza vaccine. This is recommended every year.  Tetanus, diphtheria, and acellular pertussis (Tdap, Td) vaccine. You may need a Td booster every 10 years.  Varicella vaccine. You may need this if you have not been vaccinated.  Zoster vaccine. You  may need this after age 41.  Measles, mumps, and rubella (MMR) vaccine. You may need at least one dose of MMR if you were born in 1957 or later. You may also need a second dose.  Pneumococcal 13-valent conjugate (PCV13) vaccine. You may need this if you have certain conditions and were not previously vaccinated.  Pneumococcal polysaccharide (PPSV23) vaccine. You may need one or two doses if you smoke  cigarettes or if you have certain conditions.  Meningococcal vaccine. You may need this if you have certain conditions.  Hepatitis A vaccine. You may need this if you have certain conditions or if you travel or work in places where you may be exposed to hepatitis A.  Hepatitis B vaccine. You may need this if you have certain conditions or if you travel or work in places where you may be exposed to hepatitis B.  Haemophilus influenzae type b (Hib) vaccine. You may need this if you have certain conditions.  Talk to your health care provider about which screenings and vaccines you need and how often you need them. This information is not intended to replace advice given to you by your health care provider. Make sure you discuss any questions you have with your health care provider. Document Released: 10/08/2015 Document Revised: 05/31/2016 Document Reviewed: 07/13/2015 Elsevier Interactive Patient Education  [employer].

## 2018-07-31 LAB — CBC WITH DIFFERENTIAL/PLATELET
Basophils Absolute: 0.1 10*3/uL (ref 0.0–0.1)
Basophils Relative: 1.2 % (ref 0.0–3.0)
Eosinophils Absolute: 0.1 10*3/uL (ref 0.0–0.7)
Eosinophils Relative: 1.3 % (ref 0.0–5.0)
HCT: 36.7 % (ref 36.0–46.0)
Hemoglobin: 12.3 g/dL (ref 12.0–15.0)
Lymphocytes Relative: 34.4 % (ref 12.0–46.0)
Lymphs Abs: 1.9 10*3/uL (ref 0.7–4.0)
MCHC: 33.6 g/dL (ref 30.0–36.0)
MCV: 94 fl (ref 78.0–100.0)
Monocytes Absolute: 0.5 10*3/uL (ref 0.1–1.0)
Monocytes Relative: 8.2 % (ref 3.0–12.0)
Neutro Abs: 3 10*3/uL (ref 1.4–7.7)
Neutrophils Relative %: 54.9 % (ref 43.0–77.0)
Platelets: 358 10*3/uL (ref 150.0–400.0)
RBC: 3.91 Mil/uL (ref 3.87–5.11)
RDW: 12.2 % (ref 11.5–15.5)
WBC: 5.5 10*3/uL (ref 4.0–10.5)

## 2018-07-31 LAB — LIPID PANEL
Cholesterol: 231 mg/dL — ABNORMAL HIGH (ref 0–200)
HDL: 71.1 mg/dL (ref >39.00)
LDL Cholesterol: 129 mg/dL — ABNORMAL HIGH (ref 0–99)
NonHDL: 159.83
Total CHOL/HDL Ratio: 3
Triglycerides: 152 mg/dL — ABNORMAL HIGH (ref 0.0–149.0)
VLDL: 30.4 mg/dL (ref 0.0–40.0)

## 2018-07-31 LAB — HEPATIC FUNCTION PANEL
ALT: 14 U/L (ref 0–35)
AST: 16 U/L (ref 0–37)
Albumin: 4.3 g/dL (ref 3.5–5.2)
Alkaline Phosphatase: 47 U/L (ref 39–117)
Bilirubin, Direct: 0.1 mg/dL (ref 0.0–0.3)
Total Bilirubin: 0.4 mg/dL (ref 0.2–1.2)
Total Protein: 7.1 g/dL (ref 6.0–8.3)

## 2018-07-31 LAB — BASIC METABOLIC PANEL
BUN: 9 mg/dL (ref 6–23)
CO2: 26 mEq/L (ref 19–32)
Calcium: 9.5 mg/dL (ref 8.4–10.5)
Chloride: 104 mEq/L (ref 96–112)
Creatinine, Ser: 0.66 mg/dL (ref 0.40–1.20)
GFR: 127.59 mL/min (ref >60.00)
Glucose, Bld: 90 mg/dL (ref 70–99)
Potassium: 4 mEq/L (ref 3.5–5.1)
Sodium: 139 mEq/L (ref 135–145)

## 2018-07-31 LAB — T4, FREE: Free T4: 0.79 ng/dL (ref 0.60–1.60)

## 2018-07-31 LAB — TSH: TSH: 1.13 u[IU]/mL (ref 0.35–4.50)

## 2018-07-31 LAB — T3, FREE: T3, Free: 3.1 pg/mL (ref 2.3–4.2)

## 2018-10-22 ENCOUNTER — Ambulatory Visit
Admission: EM | Admit: 2018-10-22 | Discharge: 2018-10-22 | Disposition: A | Discharge status: Home / Self Care | Payer: BLUE CROSS/BLUE SHIELD | Attending: Nurse Practitioner | Admitting: Nurse Practitioner

## 2018-10-22 DIAGNOSIS — R69 Illness, unspecified: Secondary | ICD-10-CM | POA: Insufficient documentation

## 2018-10-22 DIAGNOSIS — R05 Cough: Secondary | ICD-10-CM

## 2018-10-22 DIAGNOSIS — J111 Influenza due to unidentified influenza virus with other respiratory manifestations: Secondary | ICD-10-CM

## 2018-10-22 DIAGNOSIS — M7918 Myalgia, other site: Secondary | ICD-10-CM | POA: Diagnosis not present

## 2018-10-22 DIAGNOSIS — R0981 Nasal congestion: Secondary | ICD-10-CM

## 2018-10-22 DIAGNOSIS — R51 Headache: Secondary | ICD-10-CM

## 2018-10-22 LAB — POCT INFLUENZA A/B
Influenza A, POC: NEGATIVE
Influenza B, POC: NEGATIVE

## 2018-10-22 MED ORDER — FLUTICASONE PROPIONATE 50 MCG/ACT NA SUSP: 2.0000 | Freq: Every day | NASAL | 0 refills | Status: AC

## 2018-10-22 MED ORDER — BALOXAVIR MARBOXIL(80 MG DOSE) 2 X 40 MG PO TBPK: 80.0000 mg | ORAL_TABLET | Freq: Once | ORAL | 0 refills | Status: AC

## 2018-10-22 MED ORDER — PREDNISONE 20 MG PO TABS: 40.0000 mg | ORAL_TABLET | Freq: Every day | ORAL | 0 refills | Status: AC

## 2018-10-22 MED ORDER — IBUPROFEN 600 MG PO TABS: 600.0000 mg | ORAL_TABLET | Freq: Four times a day (QID) | ORAL | 0 refills | Status: DC | PRN

## 2018-10-22 NOTE — ED Provider Notes (Signed)
[facility]    CSN: [id number] Arrival date & time: 10/22/18  1054     History   Chief Complaint Chief Complaint  Patient presents with  . Nasal Congestion    HPI Kathy Steele is a 43 y.o. female.   Subjective:   Kathy Steele is a 43 y.o. female who presents for evaluation of influenza like symptoms. Symptoms include hot and cold spells, dry cough, headache, myalgias, clear nasal discharge and sinus/nasal congestion and have been present for 1 day. She has tried to alleviate the symptoms with dayquil with no relief. High risk factors for influenza complications: none. She denies any flu contacts.   The following portions of the patient's history were reviewed and updated as appropriate: allergies, current medications, past family history, past medical history, past social history, past surgical history and problem list.        Past Medical History:  Diagnosis Date  . Headache(784.0)   . History of chickenpox     Patient Active Problem List   Diagnosis Date Noted  . Other fatigue 09/28/2014  . Visit for preventive health examination 08/30/2011  . Flu vaccine need 08/30/2011  . Procreative management counseling 08/30/2011  . NECK PAIN 07/06/2010  . HEADACHE 07/06/2010  . 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 COMMON MIGRAINE 06/26/2008  . 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 IRREGULAR MENSTRUAL CYCLE 01/14/2008    History reviewed. No pertinent surgical history.  OB History    Gravida  1   Para  1   Term      Preterm      AB      Living        SAB      TAB      Ectopic      Multiple      Live Births               Home Medications    Prior to Admission medications   Medication Sig Start Date End Date Taking? Authorizing Provider  Baloxavir Marboxil,80 MG Dose, (XOFLUZA) 2 x 40 MG TBPK Take 80 mg by mouth once for 1 dose. 10/22/18 10/22/18  Lurline IdolMurrill, [place]on [place], FNP  cetirizine  (ZYRTEC) 10 MG tablet Take 10 mg by mouth daily.    [provider]  fluticasone (FLONASE) 50 MCG/ACT nasal spray Place 2 sprays into both nostrils daily. 10/22/18   Lurline IdolMurrill, Nathanyl Andujo, FNP  ibuprofen (ADVIL,MOTRIN) 600 MG tablet Take 1 tablet (600 mg total) by mouth every 6 (six) hours as needed. 10/22/18   Lurline IdolMurrill, Rilya Longo, FNP  predniSONE (DELTASONE) 20 MG tablet Take 2 tablets (40 mg total) by mouth daily for 5 days. 10/22/18 10/27/18  Lurline IdolMurrill, Danthony Kendrix, FNP    Family History Family History  Problem Relation Age of Onset  . Hypertension Mother   . Diabetes Paternal Uncle   . Diabetes Maternal Grandmother   . Stroke Maternal Grandmother   . Stroke Maternal Grandfather   . Arthritis Other   . Osteoarthritis Other     Social History Social History   Tobacco Use  . Smoking status: Former Smoker    Types: Cigarettes    Last attempt to quit: 05/09/2013    Years since quitting: 5.4  . Smokeless tobacco: Never Used  . Tobacco comment: Stopped 1 year ago  Substance Use Topics  . Alcohol use: No    Alcohol/week: 0.0 standard drinks    Comment: occasional  . Drug use: No  Allergies   Escitalopram oxalate   Review of Systems Review of Systems  Constitutional: Positive for chills and fever.  HENT: Positive for congestion, rhinorrhea and sinus pressure.   Respiratory: Positive for cough. Negative for shortness of breath.   Musculoskeletal: Positive for myalgias.  Neurological: Positive for headaches.     Physical Exam Triage Vital Signs ED Triage Vitals  Enc Vitals Group     BP 10/22/18 1108 111/75     Pulse Rate 10/22/18 1108 81     Resp 10/22/18 1108 18     Temp 10/22/18 1108 98.4 F (36.9 C)     Temp Source 10/22/18 1108 Oral     SpO2 10/22/18 1108 98 %     Weight --      Height --      Head Circumference --      Peak Flow --      Pain Score 10/22/18 1109 6     Pain Loc --      Pain Edu? --      Excl. in GC? --    No data found.  Updated Vital  Signs BP 111/75 (BP Location: Left Arm)   Pulse 81   Temp 98.4 F (36.9 C) (Oral)   Resp 18   LMP 09/24/2018   SpO2 98%   Visual Acuity Right Eye Distance:   Left Eye Distance:   Bilateral Distance:    Right Eye Near:   Left Eye Near:    Bilateral Near:     Physical Exam Constitutional:      General: She is not in acute distress.    Appearance: Normal appearance. She is ill-appearing. She is not toxic-appearing or diaphoretic.  HENT:     Head: Normocephalic.     Right Ear: Tympanic membrane, ear canal and external ear normal.     Left Ear: Tympanic membrane, ear canal and external ear normal.     Nose: Congestion present.     Mouth/Throat:     Mouth: Mucous membranes are moist.     Pharynx: Oropharynx is clear.  Eyes:     Extraocular Movements: Extraocular movements intact.     Conjunctiva/sclera: Conjunctivae normal.     Pupils: Pupils are equal, round, and reactive to light.  Neck:     Musculoskeletal: Normal range of motion and neck supple.  Cardiovascular:     Rate and Rhythm: Normal rate and regular rhythm.  Pulmonary:     Effort: Pulmonary effort is normal.     Breath sounds: Normal breath sounds.  Musculoskeletal: Normal range of motion.  Lymphadenopathy:     Cervical: No cervical adenopathy.  Skin:    General: Skin is warm and dry.  Neurological:     General: No focal deficit present.     Mental Status: She is alert and oriented to person, place, and time.  Psychiatric:        Mood and Affect: Mood normal.      UC Treatments / Results  Labs (all labs ordered are listed, but only abnormal results are displayed) Labs Reviewed  POCT INFLUENZA A/B - Normal    EKG None  Radiology No results found.  Procedures Procedures (including critical care time)  Medications Ordered in UC Medications - No data to display  Initial Impression / Assessment and Plan / UC Course  I have reviewed the triage vital signs and the nursing notes.  Pertinent  labs & imaging results that were available during my care of the patient were  reviewed by me and considered in my medical decision making (see chart for details).     43 year old female with no significant medical history presenting with an acute onset of influenza-like symptoms.  Vital signs stables.  Afebrile.  She is nontoxic appearing but definitely appears unwell.  Rapid flu negative; however, based on her acute presentation of symptoms will treat as influenza-like illness. Supportive care with appropriate antipyretics and fluids. Antivirals per orders. Follow up as needed.    Today's evaluation has revealed no signs of a dangerous process. Discussed diagnosis with patient. Patient aware of their diagnosis, possible red flag symptoms to watch out for and need for close follow up. Patient understands verbal and written discharge instructions. Patient comfortable with plan and disposition.  Patient has a clear mental status at this time, good insight into illness (after discussion and teaching) and has clear judgment to make decisions regarding their care.  Documentation was completed with the aid of voice recognition software. Transcription may contain typographical errors. Final Clinical Impressions(s) / UC Diagnoses   Final diagnoses:  Influenza-like illness     Discharge Instructions     Take medications as prescribed. Drink plenty of fluids and get lots of rest. You may also take tylenol as needed for fevers.     ED Prescriptions    Medication Sig Dispense Auth. Provider   fluticasone (FLONASE) 50 MCG/ACT nasal spray Place 2 sprays into both nostrils daily. 16 g Lurline IdolMurrill, Daizy Outen, FNP   predniSONE (DELTASONE) 20 MG tablet Take 2 tablets (40 mg total) by mouth daily for 5 days. 10 tablet Lurline IdolMurrill, Cambrie Sonnenfeld, FNP   ibuprofen (ADVIL,MOTRIN) 600 MG tablet Take 1 tablet (600 mg total) by mouth every 6 (six) hours as needed. 30 tablet Jodey Burbano, MetzgerSamantha, FNP   Baloxavir Marboxil,80 MG Dose,  (XOFLUZA) 2 x 40 MG TBPK Take 80 mg by mouth once for 1 dose. 1 each Lurline IdolMurrill, Kavitha Lansdale, FNP     Controlled Substance Prescriptions [place] Controlled Substance Registry consulted? Not Applicable   Lurline IdolMurrill, Vivan Agostino, [place] 10/22/18 1220

## 2018-10-22 NOTE — Discharge Instructions (Signed)
Take medications as prescribed. Drink plenty of fluids and get lots of rest. You may also take tylenol as needed for fevers.

## 2018-10-22 NOTE — ED Triage Notes (Signed)
Pt c/o sneezing, coughing, headache and bodyaches since yesterday morning

## 2018-12-31 ENCOUNTER — Other Ambulatory Visit: Payer: Self-pay

## 2018-12-31 ENCOUNTER — Emergency Department (HOSPITAL_COMMUNITY): Payer: BLUE CROSS/BLUE SHIELD

## 2018-12-31 ENCOUNTER — Emergency Department (HOSPITAL_COMMUNITY)
Admission: EM | Admit: 2018-12-31 | Discharge: 2019-01-01 | Disposition: A | Discharge status: Home / Self Care | Payer: BLUE CROSS/BLUE SHIELD | Attending: Emergency Medicine | Admitting: Emergency Medicine

## 2018-12-31 ENCOUNTER — Encounter (HOSPITAL_COMMUNITY): Payer: Self-pay

## 2018-12-31 DIAGNOSIS — R1013 Epigastric pain: Secondary | ICD-10-CM | POA: Diagnosis not present

## 2018-12-31 DIAGNOSIS — R0789 Other chest pain: Secondary | ICD-10-CM | POA: Diagnosis not present

## 2018-12-31 DIAGNOSIS — R079 Chest pain, unspecified: Secondary | ICD-10-CM | POA: Diagnosis not present

## 2018-12-31 DIAGNOSIS — Z79899 Other long term (current) drug therapy: Secondary | ICD-10-CM | POA: Diagnosis not present

## 2018-12-31 DIAGNOSIS — Z87891 Personal history of nicotine dependence: Secondary | ICD-10-CM | POA: Diagnosis not present

## 2018-12-31 DIAGNOSIS — R0602 Shortness of breath: Secondary | ICD-10-CM | POA: Diagnosis not present

## 2018-12-31 LAB — TROPONIN I: Troponin I: <0.03 ng/mL (ref <0.03)

## 2018-12-31 LAB — I-STAT BETA HCG BLOOD, ED (MC, WL, AP ONLY): I-stat hCG, quantitative: <5 m[IU]/mL (ref <5)

## 2018-12-31 LAB — CBC WITH DIFFERENTIAL/PLATELET
Abs Immature Granulocytes: 0.02 10*3/uL (ref 0.00–0.07)
Basophils Absolute: 0 10*3/uL (ref 0.0–0.1)
Basophils Relative: 0 %
Eosinophils Absolute: 0.1 10*3/uL (ref 0.0–0.5)
Eosinophils Relative: 1 %
HCT: 36.3 % (ref 36.0–46.0)
Hemoglobin: 11.7 g/dL — ABNORMAL LOW (ref 12.0–15.0)
Immature Granulocytes: 0 %
Lymphocytes Relative: 30 %
Lymphs Abs: 2.5 10*3/uL (ref 0.7–4.0)
MCH: 31.4 pg (ref 26.0–34.0)
MCHC: 32.2 g/dL (ref 30.0–36.0)
MCV: 97.3 fL (ref 80.0–100.0)
Monocytes Absolute: 0.6 10*3/uL (ref 0.1–1.0)
Monocytes Relative: 7 %
Neutro Abs: 5.1 10*3/uL (ref 1.7–7.7)
Neutrophils Relative %: 62 %
Platelets: 325 10*3/uL (ref 150–400)
RBC: 3.73 MIL/uL — ABNORMAL LOW (ref 3.87–5.11)
RDW: 11.9 % (ref 11.5–15.5)
WBC: 8.3 10*3/uL (ref 4.0–10.5)
nRBC: 0 % (ref 0.0–0.2)

## 2018-12-31 LAB — BASIC METABOLIC PANEL
Anion gap: 9 (ref 5–15)
BUN: 16 mg/dL (ref 6–20)
CO2: 23 mmol/L (ref 22–32)
Calcium: 9 mg/dL (ref 8.9–10.3)
Chloride: 104 mmol/L (ref 98–111)
Creatinine, Ser: 0.72 mg/dL (ref 0.44–1.00)
GFR calc Af Amer: >60 mL/min (ref >60)
GFR calc non Af Amer: >60 mL/min (ref >60)
Glucose, Bld: 98 mg/dL (ref 70–99)
Potassium: 3.7 mmol/L (ref 3.5–5.1)
Sodium: 136 mmol/L (ref 135–145)

## 2018-12-31 MED ORDER — ALUM & MAG HYDROXIDE-SIMETH 200-200-20 MG/5ML PO SUSP
30.0000 mL | Freq: Once | ORAL | Status: AC
  Administered 2018-12-31: 30 mL via ORAL

## 2018-12-31 MED ORDER — FAMOTIDINE 20 MG PO TABS
20.0000 mg | ORAL_TABLET | Freq: Once | ORAL | Status: AC
  Administered 2018-12-31: 20 mg via ORAL
  Filled 2018-12-31: qty 1

## 2018-12-31 MED ORDER — FAMOTIDINE IN NACL 20-0.9 MG/50ML-% IV SOLN: 20.0000 mg | Freq: Once | INTRAVENOUS | Status: DC

## 2018-12-31 MED ORDER — ALUM & MAG HYDROXIDE-SIMETH 200-200-20 MG/5ML PO SUSP
30.0000 mL | Freq: Once | ORAL | Status: DC
  Filled 2018-12-31: qty 30

## 2018-12-31 NOTE — ED Triage Notes (Addendum)
Pt arrives by POV due to pressure in her mid chest since 12/29/2018. She states nothing relieves the pressure but laying down makes it worse. Pt states the pressure in her chest is making her uncomfortable and short of breath.

## 2018-12-31 NOTE — Discharge Instructions (Addendum)
Try zantac or pepcid twice a day.  Try to avoid things that may make this worse, most commonly these are spicy foods tomato based products fatty foods chocolate and peppermint.  Alcohol and tobacco can also make this worse.  Return to the emergency department for sudden worsening pain fever or inability to eat or drink.      Person Under Monitoring Name: Kathy Steele  Location: [address] [place] [address]   Infection Prevention Recommendations for Individuals Confirmed to have, or Being Evaluated for, 2019 Novel Coronavirus (COVID-19) Infection Who Receive Care at Home  Individuals who are confirmed to have, or are being evaluated for, COVID-19 should follow the prevention steps below until a healthcare provider or local or state health department says they can return to normal activities.  Stay home except to get medical care You should restrict activities outside your home, except for getting medical care. Do not go to work, school, or public areas, and do not use public transportation or taxis.  Call ahead before visiting your doctor Before your medical appointment, call the healthcare provider and tell them that you have, or are being evaluated for, COVID-19 infection. This will help the healthcare providers office take steps to keep other people from getting infected. Ask your healthcare provider to call the local or state health department.  Monitor your symptoms Seek prompt medical attention if your illness is worsening (e.g., difficulty breathing). Before going to your medical appointment, call the healthcare provider and tell them that you have, or are being evaluated for, COVID-19 infection. Ask your healthcare provider to call the local or state health department.  Wear a facemask You should wear a facemask that covers your nose and mouth when you are in the same room with other people and when you visit a healthcare provider. People who live with or visit  you should also wear a facemask while they are in the same room with you.  Separate yourself from other people in your home As much as possible, you should stay in a different room from other people in your home. Also, you should use a separate bathroom, if available.  Avoid sharing household items You should not share dishes, drinking glasses, cups, eating utensils, towels, bedding, or other items with other people in your home. After using these items, you should wash them thoroughly with soap and water.  Cover your coughs and sneezes Cover your mouth and nose with a tissue when you cough or sneeze, or you can cough or sneeze into your sleeve. Throw used tissues in a lined trash can, and immediately wash your hands with soap and water for at least 20 seconds or use an alcohol-based hand rub.  Wash your [employer] your hands often and thoroughly with soap and water for at least 20 seconds. You can use an alcohol-based hand sanitizer if soap and water are not available and if your hands are not visibly dirty. Avoid touching your eyes, nose, and mouth with unwashed hands.   Prevention Steps for Caregivers and Household Members of Individuals Confirmed to have, or Being Evaluated for, COVID-19 Infection Being Cared for in the Home  If you live with, or provide care at home for, a person confirmed to have, or being evaluated for, COVID-19 infection please follow these guidelines to prevent infection:  Follow healthcare providers instructions Make sure that you understand and can help the patient follow any healthcare provider instructions for all care.  Provide for the patients basic  needs You should help the patient with basic needs in the home and provide support for getting groceries, prescriptions, and other personal needs.  Monitor the patients symptoms If they are getting sicker, call his or her medical provider and tell them that the patient has, or is being evaluated  for, COVID-19 infection. This will help the healthcare providers office take steps to keep other people from getting infected. Ask the healthcare provider to call the local or state health department.  Limit the number of people who have contact with the patient If possible, have only one caregiver for the patient. Other household members should stay in another home or place of residence. If this is not possible, they should stay in another room, or be separated from the patient as much as possible. Use a separate bathroom, if available. Restrict visitors who do not have an essential need to be in the home.  Keep older adults, very young children, and other sick people away from the patient Keep older adults, very young children, and those who have compromised immune systems or chronic health conditions away from the patient. This includes people with chronic heart, lung, or kidney conditions, diabetes, and cancer.  Ensure good ventilation Make sure that shared spaces in the home have good air flow, such as from an air conditioner or an opened window, weather permitting.  Wash your hands often Wash your hands often and thoroughly with soap and water for at least 20 seconds. You can use an alcohol based hand sanitizer if soap and water are not available and if your hands are not visibly dirty. Avoid touching your eyes, nose, and mouth with unwashed hands. Use disposable paper towels to dry your hands. If not available, use dedicated cloth towels and replace them when they become wet.  Wear a facemask and gloves Wear a disposable facemask at all times in the room and gloves when you touch or have contact with the patients blood, body fluids, and/or secretions or excretions, such as sweat, saliva, sputum, nasal mucus, vomit, urine, or feces.  Ensure the mask fits over your nose and mouth tightly, and do not touch it during use. Throw out disposable facemasks and gloves after using them. Do not  reuse. Wash your hands immediately after removing your facemask and gloves. If your personal clothing becomes contaminated, carefully remove clothing and launder. Wash your hands after handling contaminated clothing. Place all used disposable facemasks, gloves, and other waste in a lined container before disposing them with other household waste. Remove gloves and wash your hands immediately after handling these items.  Do not share dishes, glasses, or other household items with the patient Avoid sharing household items. You should not share dishes, drinking glasses, cups, eating utensils, towels, bedding, or other items with a patient who is confirmed to have, or being evaluated for, COVID-19 infection. After the person uses these items, you should wash them thoroughly with soap and water.  Wash laundry thoroughly Immediately remove and wash clothes or bedding that have blood, body fluids, and/or secretions or excretions, such as sweat, saliva, sputum, nasal mucus, vomit, urine, or feces, on them. Wear gloves when handling laundry from the patient. Read and follow directions on labels of laundry or clothing items and detergent. In general, wash and dry with the warmest temperatures recommended on the label.  Clean all areas the individual has used often Clean all touchable surfaces, such as counters, tabletops, doorknobs, bathroom fixtures, toilets, phones, keyboards, tablets, and bedside tables, every day.  Also, clean any surfaces that may have blood, body fluids, and/or secretions or excretions on them. Wear gloves when cleaning surfaces the patient has come in contact with. Use a diluted bleach solution (e.g., dilute bleach with 1 part bleach and 10 parts water) or a household disinfectant with a label that says EPA-registered for coronaviruses. To make a bleach solution at home, add 1 tablespoon of bleach to 1 quart (4 cups) of water. For a larger supply, add  cup of bleach to 1 gallon (16  cups) of water. Read labels of cleaning products and follow recommendations provided on product labels. Labels contain instructions for safe and effective use of the cleaning product including precautions you should take when applying the product, such as wearing gloves or eye protection and making sure you have good ventilation during use of the product. Remove gloves and wash hands immediately after cleaning.  Monitor yourself for signs and symptoms of illness Caregivers and household members are considered close contacts, should monitor their health, and will be asked to limit movement outside of the home to the extent possible. Follow the monitoring steps for close contacts listed on the symptom monitoring form.   ? If you have additional questions, contact your local health department or call the epidemiologist on call at [phone] (available 24/7). ? This guidance is subject to change. For the most up-to-date guidance from [facility], please refer to their website: TripMetro.huhttps://www.cdc.gov/coronavirus/2019-ncov/hcp/guidance-prevent-spread.html      Person Under Monitoring Name: Kathy Steele  [place] [address] [place] [id number]   CORONAVIRUS DISEASE 2019 (COVID-19) Guidance for Persons Under Investigation You are being tested for the virus that causes coronavirus disease 2019 (COVID-19). Public health actions are necessary to ensure protection of your health and the health of others, and to prevent further spread of infection. COVID-19 is caused by a virus that can cause symptoms, such as fever, cough, and shortness of breath. The primary transmission from person to person is by coughing or sneezing. On October 24, 2018, the World Health Organization announced a [employer] Emergency of International Concern and on October 25, 2018 the U.S. Department of Health and Human Services declared a public health emergency. If the virus that causesCOVID-19 spreads in the community, it  could have severe public health consequences.  As a person under investigation for COVID-19, the [place] Department of Health and [employer], Division of [employer] advises you to adhere to the following guidance until your test results are reported to you. If your test result is positive, you will receive additional information from your provider and your local health department at that time.  Remain at home until you are cleared by your health provider or public health authorities.  Keep a log of visitors to your home using the form provided. Any visitors to your home must be aware of your isolation status. If you plan to move to a new address or leave the county, notify the local health department in your county. Call a doctor or seek care if you have an urgent medical need. Before seeking medical care, call ahead and get instructions from the provider before arriving at the medical office, clinic or hospital. Notify them that you are being tested for the virus that causes COVID-19 so arrangements can be made, as necessary, to prevent transmission to others in the healthcare setting. Next, notify the local health department in your county. If a medical emergency arises and you need to call 911, inform the first responders  that you are being tested for the virus that causes COVID-19. Next, notify the local health department in your county. Adhere to all guidance set forth by the [facility] of [employer] for [facility] of patients that is based on guidance from the Center for Disease Control and Prevention with suspected or confirmed COVID-19. It is provided with this guidance for Persons Under Investigation.  Your health and the health of our community are our top priorities. Public Health officials remain available to provide assistance and counseling to you about COVID-19 and compliance with this guidance.  Provider:  ____________________________________________________________ Date: ______/_____/_________  By signing below, you acknowledge that you have read and agree to comply with this Guidance for Persons Under Investigation. ______________________________________________________________ Date: ______/_____/_________  WHO DO I CALL? You can find a list of local health departments here: [email or web address] Health Department: ____________________________________________________________________ Contact Name: ________________________________________________________________________ Telephone: ___________________________________________________________________________  Nedra Hai, Division of Public Health, Communicable Disease Branch COVID-19 Guidance for Persons Under Investigation November 30, 2018

## 2018-12-31 NOTE — ED Provider Notes (Signed)
[facility]   CSN: [id number] Arrival date & time: 12/31/18  2116    History   Chief Complaint Chief Complaint  Patient presents with  . Chest Pain    HPI Kathy Steele is a 43 y.o. female.     42 yo F with a chief complaint of chest pain and shortness of breath.  This been going on for the past 48 hours.  Patient states that started with her having migraines earlier in the week.  She had been taking Goody powders with some improvement but then realized that she had really bad reflux.  Since then she has had this pressure on her chest.  Nothing seems to make it better.  Gets worse when she lays back flat.  Not exertional no diaphoresis nausea or vomiting.  Denies radiation of the pain.  Denies trauma to the chest.  Denies history of heart attack.  Denies smoking denies hypertension hyperlipidemia diabetes.  Mom had a heart attack in her 20s.  She denies history of PE or DVT denies hemoptysis denies unilateral lower extremity edema denies recent surgery or immobilization denies estrogen use.  The history is provided by the patient.  Chest Pain  Pain location:  Epigastric Pain quality: pressure   Pain radiates to:  Does not radiate Pain severity:  Moderate Onset quality:  Gradual Duration:  2 days Timing:  Constant Progression:  Unchanged Chronicity:  New Relieved by:  Nothing Worsened by:  Certain positions (lying flat) Ineffective treatments:  None tried Associated symptoms: shortness of breath   Associated symptoms: no dizziness, no fever, no headache, no nausea, no palpitations and no vomiting     Past Medical History:  Diagnosis Date  . Headache(784.0)   . History of chickenpox     Patient Active Problem List   Diagnosis Date Noted  . Other fatigue 09/28/2014  . Visit for preventive health examination 08/30/2011  . Flu vaccine need 08/30/2011  . Procreative management counseling 08/30/2011  . NECK PAIN  07/06/2010  . HEADACHE 07/06/2010  . 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 COMMON MIGRAINE 06/26/2008  . 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 IRREGULAR MENSTRUAL CYCLE 01/14/2008    History reviewed. No pertinent surgical history.   OB History    Gravida  1   Para  1   Term      Preterm      AB      Living        SAB      TAB      Ectopic      Multiple      Live Births               Home Medications    Prior to Admission medications   Medication Sig Start Date End Date Taking? Authorizing Provider  Aspirin-Acetaminophen-Caffeine (GOODYS EXTRA STRENGTH PO) Take 1 packet by mouth daily as needed (headache).    Yes [provider]  cetirizine (ZYRTEC) 10 MG tablet Take 10 mg by mouth daily.   Yes [provider]  fluticasone (FLONASE) 50 MCG/ACT nasal spray Place 2 sprays into both nostrils daily. 10/22/18  Yes Lurline Idol, FNP  Multiple Vitamin (MULTIVITAMIN WITH MINERALS) TABS tablet Take 1 tablet by mouth daily.   Yes [provider]  ibuprofen (ADVIL,MOTRIN) 600 MG tablet Take 1 tablet (600 mg total) by mouth every 6 (six) hours as needed. Patient not taking: Reported on 12/31/2018 10/22/18  Lurline IdolMurrill, Samantha, FNP    Family History Family History  Problem Relation Age of Onset  . Hypertension Mother   . Diabetes Paternal Uncle   . Diabetes Maternal Grandmother   . Stroke Maternal Grandmother   . Stroke Maternal Grandfather   . Arthritis Other   . Osteoarthritis Other     Social History Social History   Tobacco Use  . Smoking status: Former Smoker    Types: Cigarettes    Last attempt to quit: 05/09/2013    Years since quitting: 5.6  . Smokeless tobacco: Never Used  . Tobacco comment: Stopped 1 year ago  Substance Use Topics  . Alcohol use: No    Alcohol/week: 0.0 standard drinks    Comment: occasional  . Drug use: No     Allergies   Escitalopram oxalate   Review of  Systems Review of Systems  Constitutional: Negative for chills and fever.  HENT: Negative for congestion and rhinorrhea.   Eyes: Negative for redness and visual disturbance.  Respiratory: Positive for shortness of breath. Negative for wheezing.   Cardiovascular: Positive for chest pain. Negative for palpitations.  Gastrointestinal: Negative for nausea and vomiting.  Genitourinary: Negative for dysuria and urgency.  Musculoskeletal: Negative for arthralgias and myalgias.  Skin: Negative for pallor and wound.  Neurological: Negative for dizziness and headaches.     Physical Exam Updated Vital Signs BP 106/72   Pulse 61   Temp 99.2 F (37.3 C) (Oral)   Resp 18   LMP 12/26/2018   SpO2 100%   Physical Exam Vitals signs and nursing note reviewed.  Constitutional:      General: She is not in acute distress.    Appearance: She is well-developed. She is not diaphoretic.  HENT:     Head: Normocephalic and atraumatic.  Eyes:     Pupils: Pupils are equal, round, and reactive to light.  Neck:     Musculoskeletal: Normal range of motion and neck supple.  Cardiovascular:     Rate and Rhythm: Normal rate and regular rhythm.     Heart sounds: No murmur. No friction rub. No gallop.   Pulmonary:     Effort: Pulmonary effort is normal.     Breath sounds: No wheezing or rales.  Abdominal:     General: There is no distension.     Palpations: Abdomen is soft.     Tenderness: There is no abdominal tenderness.  Musculoskeletal:        General: No tenderness.  Skin:    General: Skin is warm and dry.  Neurological:     Mental Status: She is alert and oriented to person, place, and time.  Psychiatric:        Behavior: Behavior normal.      ED Treatments / Results  Labs (all labs ordered are listed, but only abnormal results are displayed) Labs Reviewed  CBC WITH DIFFERENTIAL/PLATELET - Abnormal; Notable for the following components:      Result Value   RBC 3.73 (*)    Hemoglobin  11.7 (*)    All other components within normal limits  BASIC METABOLIC PANEL  TROPONIN I  TROPONIN I  I-STAT BETA HCG BLOOD, ED ([facility], [facility], [facility] ONLY)    EKG EKG Interpretation  Date/Time:  Tuesday December 31 2018 21:37:10 EDT Ventricular Rate:  67 PR Interval:    QRS Duration: 81 QT Interval:  366 QTC Calculation: 387 R Axis:   74 Text Interpretation:  Sinus rhythm No old tracing to compare  Confirmed by Melene Plan [phone]) on 12/31/2018 9:44:16 PM   Radiology Dg Chest 2 View  Result Date: 12/31/2018 CLINICAL DATA:  Shortness of breath. EXAM: CHEST - 2 VIEW COMPARISON:  None. FINDINGS: The cardiomediastinal contours are normal. Subsegmental atelectasis in the lingula. Pulmonary vasculature is normal. No consolidation, pleural effusion, or pneumothorax. No acute osseous abnormalities are seen. IMPRESSION: Subsegmental atelectasis in the lingula. Otherwise negative radiographs of the chest. Electronically Signed   By: Narda Rutherford M.D.   On: 12/31/2018 22:13    Procedures Procedures (including critical care time)  Medications Ordered in ED Medications  alum & mag hydroxide-simeth (MAALOX/MYLANTA) 200-200-20 MG/5ML suspension 30 mL (30 mLs Oral Given 12/31/18 2200)  famotidine (PEPCID) tablet 20 mg (20 mg Oral Given 12/31/18 2313)     Initial Impression / Assessment and Plan / ED Course  I have reviewed the triage vital signs and the nursing notes.  Pertinent labs & imaging results that were available during my care of the patient were reviewed by me and considered in my medical decision making (see chart for details).        43 yo F with a chief complaint of chest pain.  This is atypical in nature.  Started after taking multiple doses of Goody powders for headache.  Will obtain a delta troponin.  Initial EKG is unremarkable.  She is PERC negative.  Initial trop negative, mild anemia, ecg and cxr viewed by me.  No noted focal infiltrate, no ptx as viewed by me.  Pain mildly worse  with maalox, will give pepcid.   Turned over to Dr. Eudelia Bunch, please see his note for further details of care.   Final Clinical Impressions(s) / ED Diagnoses   Final diagnoses:  Nonspecific chest pain    ED Discharge Orders    None       Melene Plan, DO 01/01/19 1336

## 2018-12-31 NOTE — ED Notes (Signed)
Bed: [facility] Expected date:  Expected time:  Means of arrival:  Comments: 

## 2019-01-01 LAB — TROPONIN I: Troponin I: <0.03 ng/mL (ref <0.03)

## 2019-01-01 NOTE — ED Provider Notes (Signed)
I assumed care of this patient from Dr. Adela Lank at 0000.  Please see their note for further details of Hx, PE.  Briefly patient is a 43 y.o. female who presented with chest pain. PERC negative. Low risk for ACS. EKG reassuring. Initial trop neg. Plan for delta trop.  Delta trop negative.   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  She denied any measured fever but endorsed subjective fevers.  However she has been taking Goody powders at home.  Discussed possibility for COVID-19 exposure and patient denied any known positive contacts. She has low grade temp here. Recommended isolation.  Kathy Steele was evaluated in Emergency Department on 01/01/2019 for the symptoms described in the history of present illness. She was evaluated in the context of the global COVID-19 pandemic, which necessitated consideration that the patient might be at risk for infection with the SARS-CoV-2 virus that causes COVID-19. Institutional protocols and algorithms that pertain to the evaluation of patients at risk for COVID-19 are in a state of rapid change based on information released by regulatory bodies including the CDC and federal and state organizations. These policies and algorithms were followed during the patient's care in the ED.   The patient is safe for discharge with strict return precautions.  Disposition: Discharge  Condition: Good  I have discussed the results, Dx and Tx plan with the patient who expressed understanding and agree(s) with the plan. Discharge instructions discussed at great length. The patient was given strict return precautions who verbalized understanding of the instructions. No further questions at time of discharge.    ED Discharge Orders    None       Follow Up: Madelin Headings, MD [address] [place] [address] [phone]  Call in 1 day and discuss your  visit here and see when they want to see you in the office.  Panosh, Neta Mends, MD [address] [place] [address] [phone]  Schedule an appointment as soon as possible for a visit  As needed  [facility] [address] [phone] [place] [employer] [place] [place] [address] [phone] Go to  For any increased work of breathing, extreme fatigue or dehydration.        Nira Conn, MD 01/01/19 [phone]

## 2019-04-16 ENCOUNTER — Other Ambulatory Visit: Payer: Self-pay

## 2019-04-16 DIAGNOSIS — Z20822 Contact with and (suspected) exposure to covid-19: Secondary | ICD-10-CM

## 2019-04-16 DIAGNOSIS — R6889 Other general symptoms and signs: Secondary | ICD-10-CM | POA: Diagnosis not present

## 2019-04-20 LAB — NOVEL CORONAVIRUS, NAA: SARS-CoV-2, NAA: NOT DETECTED

## 2019-07-30 ENCOUNTER — Encounter: Payer: Self-pay | Admitting: Internal Medicine

## 2019-08-01 NOTE — Progress Notes (Signed)
Chief Complaint  Patient presents with  . Annual Exam    Pt has concerns about hair loss, back pain,and wants a referral to obgyn for possible endometriosis    HPI: Patient  Kathy Steele  43 y.o. comes in today for [place] visit  And a couple of other issues.  Hair shedding for 5 months .    No baldness no specific insight hurts.  Taking regular vitamins has a dermatology appointment for a week from now for other reasons.  Concerned she could have endometriosis was having back cramps radiating around to the right days before her periods  Using acupuncture  Clotting  A lot  And better  yaro tea.    Monthly.  Last 5 days .   Last pap abnormal and ok  .   After evaluation from Dr. Garwin Brothers supposed to have another Pap in a year.  She gets some back pain that she thinks is from sitting all day although she does take the dog for walk.   chest punching at times  No assoc sx .     Lasts hours .  Usually in the evening not associated with exercise nausea vomiting other changes  She had atypical chest pain presented to the emergency room in the spring could have been stress negative work-up this feels a little different.  Husband just got diagnosed with anorectal cancer asks about screening advice etc  fam hx thyroid .  Health Maintenance  Topic Date Due  . HIV Screening  08/12/1993  . PAP SMEAR-Modifier  02/01/2019  . INFLUENZA VACCINE  04/26/2019  . TETANUS/TDAP  07/06/2020   Health Maintenance Review LIFESTYLE:  Exercise:   Walks dog every dog .  Tobacco/ETS:  no Alcohol:   Rare  Sugar beverages: minute maid juices and HP .  Sleep:  9- interrupted  From husband  Drug use: no HH of  3  Dog  Work: work from home  11-12 hours per day some times   ROS: See HPI GEN/ HEENT: No fever, significant weight changes sweats headaches vision problems hearing changes, CV/ PULM; No chest pain shortness of breath cough, syncope,edema  change in exercise tolerance. GI  /GU: No adominal pain, vomiting, change in bowel habits. No blood in the stool. No significant GU symptoms. SKIN/HEME: ,no acute skin rashes suspicious lesions or bleeding. No lymphadenopathy, nodules, masses.  NEURO/ PSYCH:  No neurologic signs such as weakness numbness. No depression anxiety. IMM/ Allergy: No unusual infections.  Allergy .   REST of 12 system review negative except as per HPI   Past Medical History:  Diagnosis Date  . Headache(784.0)   . History of chickenpox     No past surgical history on file.  Family History  Problem Relation Age of Onset  . Hypertension Mother   . Diabetes Paternal Uncle   . Diabetes Maternal Grandmother   . Stroke Maternal Grandmother   . Stroke Maternal Grandfather   . Arthritis Other   . Osteoarthritis Other     Social History   Socioeconomic History  . Marital status: Married    Spouse name: Not on file  . Number of children: Not on file  . Years of education: Not on file  . Highest education level: Not on file  Occupational History  . Not on file  Social Needs  . Financial resource strain: Not on file  . Food insecurity    Worry: Not on file    Inability: Not on  file  . Transportation needs    Medical: Not on file    Non-medical: Not on file  Tobacco Use  . Smoking status: Former Smoker    Types: Cigarettes    Quit date: 05/09/2013    Years since quitting: 6.2  . Smokeless tobacco: Never Used  . Tobacco comment: Stopped 1 year ago  Substance and Sexual Activity  . Alcohol use: No    Alcohol/week: 0.0 standard drinks    Comment: occasional  . Drug use: No  . Sexual activity: Not on file  Lifestyle  . Physical activity    Days per week: Not on file    Minutes per session: Not on file  . Stress: Not on file  Relationships  . Social Herbalist on phone: Not on file    Gets together: Not on file    Attends religious service: Not on file    Active member of club or organization: Not on file    Attends  meetings of clubs or organizations: Not on file    Relationship status: Not on file  Other Topics Concern  . 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 cetirizine (ZYRTEC) 10 MG tablet Take 10 mg by mouth daily.    . fluticasone (FLONASE) 50 MCG/ACT nasal spray Place 2 sprays into both nostrils daily. 16 g 0  . Multiple Vitamin (MULTIVITAMIN WITH MINERALS) TABS tablet Take 1 tablet by mouth daily.    . naproxen sodium (ALEVE) 220 MG tablet Take 220 mg by mouth as needed.    . Prenatal Vit-Fe Fumarate-FA (PRENATAL MULTIVITAMIN) TABS tablet Take 1 tablet by mouth daily at 12 noon.    . Aspirin-Acetaminophen-Caffeine (GOODYS EXTRA STRENGTH PO) Take 1 packet by mouth daily as needed (headache).     Marland Kitchen ibuprofen (ADVIL,MOTRIN) 600 MG tablet Take 1 tablet (600 mg total) by mouth every 6 (six) hours as needed. (Patient not taking: Reported on 12/31/2018) 30 tablet 0   No facility-administered medications prior to visit.      EXAM:  BP 116/64 (BP Location: Right Arm, Patient Position: Sitting, Cuff Size: Normal)   Pulse 92   Temp 98.2 F (36.8 C) (Temporal)   Ht '5\' 3"'$  (1.6 m)   Wt 153 lb 9.6 oz (69.7 kg)   LMP 07/10/2019   SpO2 97%   BMI 27.21 kg/m   Body mass index is 27.21 kg/m. Wt Readings from Last 3 Encounters:  08/04/19 153 lb 9.6 oz (69.7 kg)  07/30/18 148 lb 14.4 oz (67.5 kg)  02/01/16 150 lb 12.8 oz (68.4 kg)    Physical Exam: Vital signs reviewed WGY:KZLD is a well-developed well-nourished alert cooperative    who appearsr stated age in no acute distress.  HEENT: normocephalic atraumatic , Eyes: PERRL EOM's full, conjunctiva  clear, ., Ears: no deformity EAC's clear TMs with normal landmarks. Mouth: masked NECK: supple without masses, thyromegaly or bruits. CHEST/PULM:  Clear to auscultation and percussion breath sounds  equal no wheeze , rales or rhonchi. No chest wall deformities or tenderness. Breast: normal by inspection . No dimpling, discharge, masses, tenderness or discharge . CV: PMI is nondisplaced, S1 S2 no gallops, murmurs, rubs. Peripheral pulses are full without delay.No JVD .  ABDOMEN: Bowel sounds normal nontender  No guard or rebound, no hepato splenomegal no CVA tenderness.   Extremtities:  No clubbing cyanosis or edema, no acute joint swelling or redness no focal atrophy NEURO:  Oriented x3, cranial nerves 3-12 appear to be intact, no obvious focal weakness,gait within normal limits no abnormal reflexes or asymmetrical SKIN: No acute rashes normal turgor, color, no bruising or petechiae.  Scalp no lesions  Rash or patches  PSYCH: Oriented, good eye contact, no obvious depression anxiety, cognition and judgment appear normal. LN: no cervical axillary inguinal adenopathy  Lab Results  Component Value Date   WBC 8.3 12/31/2018   HGB 11.7 (L) 12/31/2018   HCT 36.3 12/31/2018   PLT 325 12/31/2018   GLUCOSE 98 12/31/2018   CHOL 231 (H) 07/30/2018   TRIG 152.0 (H) 07/30/2018   HDL 71.10 07/30/2018   LDLDIRECT 131.6 09/09/2013   LDLCALC 129 (H) 07/30/2018   ALT 14 07/30/2018   AST 16 07/30/2018   NA 136 12/31/2018   K 3.7 12/31/2018   CL 104 12/31/2018   CREATININE 0.72 12/31/2018   BUN 16 12/31/2018   CO2 23 12/31/2018   TSH 1.13 07/30/2018    BP Readings from Last 3 Encounters:  08/04/19 116/64  01/01/19 106/72  10/22/18 111/75    Lab plan  reviewed with patient  Fasting   ASSESSMENT AND PLAN:  Discussed the following assessment and plan:    ICD-10-CM   1. Visit for preventive health examination  V94.80 Basic metabolic panel    CBC with Differential    Hemoglobin A1c    Hepatic  function panel    Lipid panel    TSH    T4, Free (Thyrox)    T3, Free (Triiodothyronine)   Discussed getting mammogram colon cancer screening in 45 and above.  2. Medication management  X65.537 Basic metabolic panel    CBC with Differential    Hemoglobin A1c    Hepatic function panel    Lipid panel    TSH    T4, Free (Thyrox)    T3, Free (Triiodothyronine)  3. Hyperlipidemia, unspecified hyperlipidemia type  S82.7 Basic metabolic panel    CBC with Differential    Hemoglobin A1c    Hepatic function panel    Lipid panel    TSH    T4, Free (Thyrox)    T3, Free (Triiodothyronine)  4. Need for immunization against influenza  Z23 Flu Vaccine QUAD 36+ mos IM  5. Hair loss  M78.6 Basic metabolic panel    CBC with Differential    Hemoglobin A1c    Hepatic function panel    Lipid panel    TSH    T4, Free (Thyrox)    T3, Free (Triiodothyronine)    C-reactive protein   Sounds like shedding possibly telogen effluvium exam looks good today  6. Midline low back pain without sciatica, unspecified chronicity  L54.4 Basic metabolic panel    CBC with Differential    Hemoglobin A1c    Hepatic function panel    Lipid panel    TSH    T4, Free (Thyrox)    T3, Free (Triiodothyronine)    C-reactive protein  7. Dysmenorrhea  B20.1 Basic metabolic panel  CBC with Differential    Hemoglobin A1c    Hepatic function panel    Lipid panel    TSH    T4, Free (Thyrox)    T3, Free (Triiodothyronine)    C-reactive protein   Apparently improved with acupuncture but does get back pain and radiating to the right advised GYN evaluation as discussed    Patient Care Team: Erikka Follmer, Standley Brooking, MD as PCP - General Barrie Dunker, MD (Dermatology) Azucena Fallen, MD as Attending Physician (Obstetrics and Gynecology) Donneta Romberg, MD (Inactive) as Consulting Physician (Obstetrics and Gynecology) Patient Instructions  Checking labs  And thyroid   Sometimes hair shedding is  Telogen  effluvium    Ask derma   Also   Increase exercise   Stretching    For mechanichal back sx.  Want  You to see GYNE   For pre menstrual cramps    And pap  Etc .   Colon cancer screening  Age 56 and mammo can do this year .    Preventive Care 73-15 Years Old, Female Preventive care refers to visits with your health care provider and lifestyle choices that can promote health and wellness. This includes:  A yearly physical exam. This may also be called an annual well check.  Regular dental visits and eye exams.  Immunizations.  Screening for certain conditions.  Healthy lifestyle choices, such as eating a healthy diet, getting regular exercise, not using drugs or products that contain nicotine and tobacco, and limiting alcohol use. What can I expect for my preventive care visit? Physical exam Your health care provider will check your:  Height and weight. This may be used to calculate body mass index (BMI), which tells if you are at a healthy weight.  Heart rate and blood pressure.  Skin for abnormal spots. Counseling Your health care provider may ask you questions about your:  Alcohol, tobacco, and drug use.  Emotional well-being.  Home and relationship well-being.  Sexual activity.  Eating habits.  Work and work Statistician.  Method of birth control.  Menstrual cycle.  Pregnancy history. What immunizations do I need?  Influenza (flu) vaccine  This is recommended every year. Tetanus, diphtheria, and pertussis (Tdap) vaccine  You may need a Td booster every 10 years. Varicella (chickenpox) vaccine  You may need this if you have not been vaccinated. Zoster (shingles) vaccine  You may need this after age 27. Measles, mumps, and rubella (MMR) vaccine  You may need at least one dose of MMR if you were born in 1957 or later. You may also need a second dose. Pneumococcal conjugate (PCV13) vaccine  You may need this if you have certain conditions and were not  previously vaccinated. Pneumococcal polysaccharide (PPSV23) vaccine  You may need one or two doses if you smoke cigarettes or if you have certain conditions. Meningococcal conjugate (MenACWY) vaccine  You may need this if you have certain conditions. Hepatitis A vaccine  You may need this if you have certain conditions or if you travel or work in places where you may be exposed to hepatitis A. Hepatitis B vaccine  You may need this if you have certain conditions or if you travel or work in places where you may be exposed to hepatitis B. Haemophilus influenzae type b (Hib) vaccine  You may need this if you have certain conditions. Human papillomavirus (HPV) vaccine  If recommended by your health care provider, you may need three doses over 6 months. You may receive vaccines  as individual doses or as more than one vaccine together in one shot (combination vaccines). Talk with your health care provider about the risks and benefits of combination vaccines. What tests do I need? Blood tests  Lipid and cholesterol levels. These may be checked every 5 years, or more frequently if you are over 16 years old.  Hepatitis C test.  Hepatitis B test. Screening  Lung cancer screening. You may have this screening every year starting at age 30 if you have a 30-pack-year history of smoking and currently smoke or have quit within the past 15 years.  Colorectal cancer screening. All adults should have this screening starting at age 52 and continuing until age 25. Your health care provider may recommend screening at age 58 if you are at increased risk. You will have tests every 1-10 years, depending on your results and the type of screening test.  Diabetes screening. This is done by checking your blood sugar (glucose) after you have not eaten for a while (fasting). You may have this done every 1-3 years.  Mammogram. This may be done every 1-2 years. Talk with your health care provider about when you  should start having regular mammograms. This may depend on whether you have a family history of breast cancer.  BRCA-related cancer screening. This may be done if you have a family history of breast, ovarian, tubal, or peritoneal cancers.  Pelvic exam and Pap test. This may be done every 3 years starting at age 7. Starting at age 11, this may be done every 5 years if you have a Pap test in combination with an HPV test. Other tests  Sexually transmitted disease (STD) testing.  Bone density scan. This is done to screen for osteoporosis. You may have this scan if you are at high risk for osteoporosis. Follow these instructions at home: Eating and drinking  Eat a diet that includes fresh fruits and vegetables, whole grains, lean protein, and low-fat dairy.  Take vitamin and mineral supplements as recommended by your health care provider.  Do not drink alcohol if: ? Your health care provider tells you not to drink. ? You are pregnant, may be pregnant, or are planning to become pregnant.  If you drink alcohol: ? Limit how much you have to 0-1 drink a day. ? Be aware of how much alcohol is in your drink. In the [place] one drink equals one 12 oz bottle of beer (355 mL), one 5 oz glass of wine (148 mL), or one 1 oz glass of hard liquor (44 mL). Lifestyle  Take daily care of your teeth and gums.  Stay active. Exercise for at least 30 minutes on 5 or more days each week.  Do not use any products that contain nicotine or tobacco, such as cigarettes, e-cigarettes, and chewing tobacco. If you need help quitting, ask your health care provider.  If you are sexually active, practice safe sex. Use a condom or other form of birth control (contraception) in order to prevent pregnancy and STIs (sexually transmitted infections).  If told by your health care provider, take low-dose aspirin daily starting at age 48. What's next?  Visit your health care provider once a year for a well check visit.   Ask your health care provider how often you should have your eyes and teeth checked.  Stay up to date on all vaccines. This information is not intended to replace advice given to you by your health care provider. Make sure you discuss any questions  you have with your health care provider. Document Released: 10/08/2015 Document Revised: 05/23/2018 Document Reviewed: 05/23/2018 Elsevier Patient Education  2020 [place] Hikeem Andersson M.D.

## 2019-08-03 NOTE — Patient Instructions (Addendum)
Checking labs  And thyroid   Sometimes hair shedding is  Telogen effluvium    Ask derma   Also   Increase exercise   Stretching    For mechanichal back sx.  Want  You to see GYNE   For pre menstrual cramps    And pap  Etc .   Colon cancer screening  Age 43 and mammo can do this year .    Preventive Care 37-36 Years Old, Female Preventive care refers to visits with your health care provider and lifestyle choices that can promote health and wellness. This includes:  A yearly physical exam. This may also be called an annual well check.  Regular dental visits and eye exams.  Immunizations.  Screening for certain conditions.  Healthy lifestyle choices, such as eating a healthy diet, getting regular exercise, not using drugs or products that contain nicotine and tobacco, and limiting alcohol use. What can I expect for my preventive care visit? Physical exam Your health care provider will check your:  Height and weight. This may be used to calculate body mass index (BMI), which tells if you are at a healthy weight.  Heart rate and blood pressure.  Skin for abnormal spots. Counseling Your health care provider may ask you questions about your:  Alcohol, tobacco, and drug use.  Emotional well-being.  Home and relationship well-being.  Sexual activity.  Eating habits.  Work and work Statistician.  Method of birth control.  Menstrual cycle.  Pregnancy history. What immunizations do I need?  Influenza (flu) vaccine  This is recommended every year. Tetanus, diphtheria, and pertussis (Tdap) vaccine  You may need a Td booster every 10 years. Varicella (chickenpox) vaccine  You may need this if you have not been vaccinated. Zoster (shingles) vaccine  You may need this after age 24. Measles, mumps, and rubella (MMR) vaccine  You may need at least one dose of MMR if you were born in 1957 or later. You may also need a second dose. Pneumococcal conjugate (PCV13) vaccine   You may need this if you have certain conditions and were not previously vaccinated. Pneumococcal polysaccharide (PPSV23) vaccine  You may need one or two doses if you smoke cigarettes or if you have certain conditions. Meningococcal conjugate (MenACWY) vaccine  You may need this if you have certain conditions. Hepatitis A vaccine  You may need this if you have certain conditions or if you travel or work in places where you may be exposed to hepatitis A. Hepatitis B vaccine  You may need this if you have certain conditions or if you travel or work in places where you may be exposed to hepatitis B. Haemophilus influenzae type b (Hib) vaccine  You may need this if you have certain conditions. Human papillomavirus (HPV) vaccine  If recommended by your health care provider, you may need three doses over 6 months. You may receive vaccines as individual doses or as more than one vaccine together in one shot (combination vaccines). Talk with your health care provider about the risks and benefits of combination vaccines. What tests do I need? Blood tests  Lipid and cholesterol levels. These may be checked every 5 years, or more frequently if you are over 50 years old.  Hepatitis C test.  Hepatitis B test. Screening  Lung cancer screening. You may have this screening every year starting at age 77 if you have a 30-pack-year history of smoking and currently smoke or have quit within the past 15 years.  Colorectal  cancer screening. All adults should have this screening starting at age 33 and continuing until age 37. Your health care provider may recommend screening at age 35 if you are at increased risk. You will have tests every 1-10 years, depending on your results and the type of screening test.  Diabetes screening. This is done by checking your blood sugar (glucose) after you have not eaten for a while (fasting). You may have this done every 1-3 years.  Mammogram. This may be done  every 1-2 years. Talk with your health care provider about when you should start having regular mammograms. This may depend on whether you have a family history of breast cancer.  BRCA-related cancer screening. This may be done if you have a family history of breast, ovarian, tubal, or peritoneal cancers.  Pelvic exam and Pap test. This may be done every 3 years starting at age 44. Starting at age 49, this may be done every 5 years if you have a Pap test in combination with an HPV test. Other tests  Sexually transmitted disease (STD) testing.  Bone density scan. This is done to screen for osteoporosis. You may have this scan if you are at high risk for osteoporosis. Follow these instructions at home: Eating and drinking  Eat a diet that includes fresh fruits and vegetables, whole grains, lean protein, and low-fat dairy.  Take vitamin and mineral supplements as recommended by your health care provider.  Do not drink alcohol if: ? Your health care provider tells you not to drink. ? You are pregnant, may be pregnant, or are planning to become pregnant.  If you drink alcohol: ? Limit how much you have to 0-1 drink a day. ? Be aware of how much alcohol is in your drink. In the [place] one drink equals one 12 oz bottle of beer (355 mL), one 5 oz glass of wine (148 mL), or one 1 oz glass of hard liquor (44 mL). Lifestyle  Take daily care of your teeth and gums.  Stay active. Exercise for at least 30 minutes on 5 or more days each week.  Do not use any products that contain nicotine or tobacco, such as cigarettes, e-cigarettes, and chewing tobacco. If you need help quitting, ask your health care provider.  If you are sexually active, practice safe sex. Use a condom or other form of birth control (contraception) in order to prevent pregnancy and STIs (sexually transmitted infections).  If told by your health care provider, take low-dose aspirin daily starting at age 44. What's next?  Visit  your health care provider once a year for a well check visit.  Ask your health care provider how often you should have your eyes and teeth checked.  Stay up to date on all vaccines. This information is not intended to replace advice given to you by your health care provider. Make sure you discuss any questions you have with your health care provider. Document Released: 10/08/2015 Document Revised: 05/23/2018 Document Reviewed: 05/23/2018 Elsevier Patient Education  2020 [employer].

## 2019-08-04 ENCOUNTER — Ambulatory Visit (INDEPENDENT_AMBULATORY_CARE_PROVIDER_SITE_OTHER): Payer: BC Managed Care – PPO | Admitting: Internal Medicine

## 2019-08-04 ENCOUNTER — Encounter: Payer: Self-pay | Admitting: Internal Medicine

## 2019-08-04 ENCOUNTER — Encounter: Payer: BLUE CROSS/BLUE SHIELD | Admitting: Internal Medicine

## 2019-08-04 ENCOUNTER — Other Ambulatory Visit: Payer: Self-pay

## 2019-08-04 VITALS — BP 116/64 | HR 92 | Temp 98.2°F | Ht 63.0 in | Wt 153.6 lb

## 2019-08-04 DIAGNOSIS — E785 Hyperlipidemia, unspecified: Secondary | ICD-10-CM | POA: Diagnosis not present

## 2019-08-04 DIAGNOSIS — Z79899 Other long term (current) drug therapy: Secondary | ICD-10-CM

## 2019-08-04 DIAGNOSIS — L659 Nonscarring hair loss, unspecified: Secondary | ICD-10-CM | POA: Diagnosis not present

## 2019-08-04 DIAGNOSIS — Z0000 Encounter for general adult medical examination without abnormal findings: Secondary | ICD-10-CM | POA: Diagnosis not present

## 2019-08-04 DIAGNOSIS — N946 Dysmenorrhea, unspecified: Secondary | ICD-10-CM

## 2019-08-04 DIAGNOSIS — Z23 Encounter for immunization: Secondary | ICD-10-CM

## 2019-08-04 DIAGNOSIS — M545 Low back pain, unspecified: Secondary | ICD-10-CM

## 2019-08-04 LAB — LIPID PANEL
Cholesterol: 221 mg/dL — ABNORMAL HIGH (ref 0–200)
HDL: 60.1 mg/dL (ref >39.00)
LDL Cholesterol: 146 mg/dL — ABNORMAL HIGH (ref 0–99)
NonHDL: 160.47
Total CHOL/HDL Ratio: 4
Triglycerides: 73 mg/dL (ref 0.0–149.0)
VLDL: 14.6 mg/dL (ref 0.0–40.0)

## 2019-08-04 LAB — CBC WITH DIFFERENTIAL/PLATELET
Basophils Absolute: 0 10*3/uL (ref 0.0–0.1)
Basophils Relative: 0.2 % (ref 0.0–3.0)
Eosinophils Absolute: 0 10*3/uL (ref 0.0–0.7)
Eosinophils Relative: 0.4 % (ref 0.0–5.0)
HCT: 37.3 % (ref 36.0–46.0)
Hemoglobin: 12.6 g/dL (ref 12.0–15.0)
Lymphocytes Relative: 31.9 % (ref 12.0–46.0)
Lymphs Abs: 1.5 10*3/uL (ref 0.7–4.0)
MCHC: 33.8 g/dL (ref 30.0–36.0)
MCV: 94.1 fl (ref 78.0–100.0)
Monocytes Absolute: 0.4 10*3/uL (ref 0.1–1.0)
Monocytes Relative: 8.3 % (ref 3.0–12.0)
Neutro Abs: 2.8 10*3/uL (ref 1.4–7.7)
Neutrophils Relative %: 59.2 % (ref 43.0–77.0)
Platelets: 303 10*3/uL (ref 150.0–400.0)
RBC: 3.97 Mil/uL (ref 3.87–5.11)
RDW: 12.6 % (ref 11.5–15.5)
WBC: 4.7 10*3/uL (ref 4.0–10.5)

## 2019-08-04 LAB — HEPATIC FUNCTION PANEL
ALT: 10 U/L (ref 0–35)
AST: 13 U/L (ref 0–37)
Albumin: 4.5 g/dL (ref 3.5–5.2)
Alkaline Phosphatase: 54 U/L (ref 39–117)
Bilirubin, Direct: 0.1 mg/dL (ref 0.0–0.3)
Total Bilirubin: 0.5 mg/dL (ref 0.2–1.2)
Total Protein: 7.3 g/dL (ref 6.0–8.3)

## 2019-08-04 LAB — TSH: TSH: 1.25 u[IU]/mL (ref 0.35–4.50)

## 2019-08-04 LAB — BASIC METABOLIC PANEL
BUN: 12 mg/dL (ref 6–23)
CO2: 26 mEq/L (ref 19–32)
Calcium: 9.1 mg/dL (ref 8.4–10.5)
Chloride: 103 mEq/L (ref 96–112)
Creatinine, Ser: 0.63 mg/dL (ref 0.40–1.20)
GFR: 126.02 mL/min (ref >60.00)
Glucose, Bld: 96 mg/dL (ref 70–99)
Potassium: 4.4 mEq/L (ref 3.5–5.1)
Sodium: 136 mEq/L (ref 135–145)

## 2019-08-04 LAB — C-REACTIVE PROTEIN: CRP: <1 mg/dL (ref 0.5–20.0)

## 2019-08-04 LAB — HEMOGLOBIN A1C: Hgb A1c MFr Bld: 5.2 % (ref 4.6–6.5)

## 2019-08-04 LAB — T4, FREE: Free T4: 0.91 ng/dL (ref 0.60–1.60)

## 2019-08-04 LAB — T3, FREE: T3, Free: 4.2 pg/mL (ref 2.3–4.2)

## 2019-08-11 DIAGNOSIS — L700 Acne vulgaris: Secondary | ICD-10-CM | POA: Diagnosis not present

## 2019-09-03 DIAGNOSIS — Z20828 Contact with and (suspected) exposure to other viral communicable diseases: Secondary | ICD-10-CM | POA: Diagnosis not present

## 2019-09-04 DIAGNOSIS — Z1231 Encounter for screening mammogram for malignant neoplasm of breast: Secondary | ICD-10-CM | POA: Diagnosis not present

## 2019-09-04 DIAGNOSIS — R350 Frequency of micturition: Secondary | ICD-10-CM | POA: Diagnosis not present

## 2019-09-04 DIAGNOSIS — Z124 Encounter for screening for malignant neoplasm of cervix: Secondary | ICD-10-CM | POA: Diagnosis not present

## 2019-09-04 DIAGNOSIS — Z01419 Encounter for gynecological examination (general) (routine) without abnormal findings: Secondary | ICD-10-CM | POA: Diagnosis not present

## 2019-09-24 DIAGNOSIS — R102 Pelvic and perineal pain: Secondary | ICD-10-CM | POA: Diagnosis not present

## 2019-09-24 DIAGNOSIS — R928 Other abnormal and inconclusive findings on diagnostic imaging of breast: Secondary | ICD-10-CM | POA: Diagnosis not present

## 2019-09-25 ENCOUNTER — Telehealth: Payer: Self-pay | Admitting: *Deleted

## 2019-09-25 NOTE — Telephone Encounter (Signed)
Copied from [place] [id number]. Topic: Quick Communication - See Telephone Encounter >> Sep 25, 2019 10:53 AM Loma [place] wrote: CRM for notification. See Telephone encounter for: 09/25/19. Pt for personal reasons wants to switch PCP from Dr Regis Bill to Dr [place]. PT was told that note would be sent to Dr Mamie Nick and Dr Lenna Sciara to respond to request.

## 2019-09-25 NOTE — Telephone Encounter (Signed)
Ok with me 

## 2019-09-30 NOTE — Telephone Encounter (Signed)
It is ok with me. Thanks 

## 2019-09-30 NOTE — Telephone Encounter (Signed)
Okay to schedule pt with Dr. [place].

## 2019-10-22 NOTE — Telephone Encounter (Signed)
Patient scheduled to [facility] with [place] on 10/27/2019 at 3:30 virtual

## 2019-10-27 ENCOUNTER — Encounter: Payer: Self-pay | Admitting: Family Medicine

## 2019-10-27 ENCOUNTER — Other Ambulatory Visit: Payer: Self-pay

## 2019-10-27 ENCOUNTER — Encounter: Payer: BC Managed Care – PPO | Admitting: Family Medicine

## 2019-10-28 ENCOUNTER — Telehealth (INDEPENDENT_AMBULATORY_CARE_PROVIDER_SITE_OTHER): Payer: BC Managed Care – PPO | Admitting: Family Medicine

## 2019-10-28 ENCOUNTER — Encounter: Payer: Self-pay | Admitting: Family Medicine

## 2019-10-28 VITALS — Ht 63.0 in

## 2019-10-28 DIAGNOSIS — E785 Hyperlipidemia, unspecified: Secondary | ICD-10-CM

## 2019-10-28 DIAGNOSIS — R350 Frequency of micturition: Secondary | ICD-10-CM

## 2019-10-28 NOTE — Progress Notes (Signed)
Virtual Visit via Video Note   I connected with Ms Duffus on 10/28/19 by a video enabled telemedicine application and verified that I am speaking with the correct person using two identifiers.  Location patient: home Location provider:work office Persons participating in the virtual visit: patient, provider  I discussed the limitations of evaluation and management by telemedicine and the availability of in person appointments. The patient expressed understanding and agreed to proceed.   HPI: Ms Human is a 44 yo female who is establishing care today. Former PCP: Dr. Fabian Sharp. Last CPE 08/04/2019.  She follows with gynecology regularly.  Pt reporting that in 08/2019 she was diagnosed with adenomyosis. Pelvic pain and menstrual flow have improved with acupuncture treatment + euro tea 10 days before menses.  She has had some concerns about urinary symptoms. Amnia odor like urine. In 07/2019 she started with sudden onset of urinary frequency, nocturia x3-4 times. Negative for fever, chills, gross hematuria, dysuria, or urine incontinence. + Urgency. She has seen her gynecologist, UA and urine culture done.  Thought that urinary symptoms were not caused by UTI but rather overactive bladder. After discussion of some side effects of anticholinergic medications with her gynecologist, she decided to hold on pharmacologic treatment. She has arranged an appointment with urologist.  HLD: Currently she is on nonpharmacologic treatment. She has made a few changes in her diet, low-fat and low-carb diet and plenty of vegetables. Negative for hypertension, diabetes, or tobacco use.  Lab Results  Component Value Date   CHOL 221 (H) 08/04/2019   HDL 60.10 08/04/2019   LDLCALC 146 (H) 08/04/2019   LDLDIRECT 131.6 09/09/2013   TRIG 73.0 08/04/2019   CHOLHDL 4 08/04/2019   ROS: See pertinent positives and negatives per HPI.  Past Medical History:  Diagnosis Date  . Headache(784.0)   . History of  chickenpox     No past surgical history on file.  Family History  Problem Relation Age of Onset  . Hypertension Mother   . Diabetes Paternal Uncle   . Diabetes Maternal Grandmother   . Stroke Maternal Grandmother   . Stroke Maternal Grandfather   . Arthritis Other   . Osteoarthritis Other     Social History   Socioeconomic History  . Marital status: Married    Spouse name: Not on file  . Number of children: Not on file  . Years of education: Not on file  . Highest education level: Not on file  Occupational History  . Not on file  Tobacco Use  . Smoking status: Former Smoker    Types: Cigarettes    Quit date: 05/09/2013    Years since quitting: 6.4  . Smokeless tobacco: Never Used  . Tobacco comment: Stopped 1 year ago  Substance and Sexual Activity  . Alcohol use: No    Alcohol/week: 0.0 standard drinks    Comment: occasional  . Drug use: No  . Sexual activity: Not on file  Other Topics Concern  . 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 Difficulty of Paying Living Expenses: Not on file  Food Insecurity:   . Worried About Programme researcher, broadcasting/film/video in the Last Year: Not on file  . Ran Out of Food in the Last Year: Not on file  Transportation Needs:   . Lack of Transportation (Medical): Not on file  . Lack of Transportation (Non-Medical): Not on file  Physical Activity:   . Days of Exercise per Week: Not on file  . Minutes of Exercise per Session: Not on file  Stress:   . Feeling of Stress : Not on file  Social Connections:   . Frequency of Communication with Friends and Family: Not on file  . Frequency of Social Gatherings with  Friends and Family: Not on file  . Attends Religious Services: Not on file  . Active Member of Clubs or Organizations: Not on file  . Attends Banker Meetings: Not on file  . Marital Status: Not on file  Intimate Partner Violence:   . Fear of Current or Ex-Partner: Not on file  . Emotionally Abused: Not on file  . Physically Abused: Not on file  . Sexually Abused: Not on file    Current Outpatient Medications:  .  Aspirin-Acetaminophen-Caffeine (GOODYS EXTRA STRENGTH PO), Take 1 packet by mouth daily as needed (headache). , Disp: , Rfl:  .  BLACK CURRANT SEED OIL PO, Take by mouth., Disp: , Rfl:  .  cetirizine (ZYRTEC) 10 MG tablet, Take 10 mg by mouth daily., Disp: , Rfl:  .  ELDERBERRY PO, Take by mouth., Disp: , Rfl:  .  fluticasone (FLONASE) 50 MCG/ACT nasal spray, Place 2 sprays into both nostrils daily., Disp: 16 g, Rfl: 0 .  Multiple Vitamin (MULTIVITAMIN WITH MINERALS) TABS tablet, Take 1 tablet by mouth daily., Disp: , Rfl:  .  naproxen sodium (ALEVE) 220 MG tablet, Take 220 mg by mouth as needed., Disp: , Rfl:  .  Prenatal Vit-Fe Fumarate-FA (PRENATAL MULTIVITAMIN) TABS tablet, Take 1 tablet by mouth daily at 12 noon., Disp: , Rfl:   EXAM:  VITALS per patient if applicable:Ht 5\' 3"  (1.6 m)   LMP 10/05/2019 (Exact Date)   BMI 27.21 kg/m   GENERAL: alert, oriented, appears well and in no acute distress  HEENT: atraumatic, conjunctiva clear, no obvious abnormalities on inspection.  NECK: normal movements of the head and neck  LUNGS: on inspection no signs of respiratory distress, breathing rate appears normal, no obvious gross SOB, gasping or wheezing  CV: no obvious cyanosis  MS: moves all visible extremities without noticeable abnormality  PSYCH/NEURO: pleasant and cooperative, no obvious depression or anxiety, speech and thought processing grossly intact  ASSESSMENT AND PLAN:  Discussed the following assessment and plan:  Urinary  frequency Possible etiology discussed. For now she is not interested in medication to treat possible overactive bladder. She has an appointment with urologist on 10/30/2019.  Hyperlipidemia, unspecified hyperlipidemia type Low CV risk. Continue non pharmacologic treatment. F/U in 07/2020.    The 10-year ASCVD risk score 08/2020 DC Denman George., et al., 2013) is: 0.3%   Values used to calculate the score:     Age: 11 years     Sex: Female     Is Non-Hispanic African American: Yes     Diabetic: No     Tobacco smoker: No     Systolic Blood Pressure: 116 mmHg     Is BP treated: No     HDL Cholesterol: 60.1 mg/dL  Total Cholesterol: 221 mg/dL  I discussed the assessment and treatment plan with the patient. Ms Bridgeforth was provided an opportunity to ask questions and all were answered. She agreed with the plan and demonstrated an understanding of the instructions.    Return in about 43 weeks (around 08/24/2020) for CPE.   Min Tunnell Martin[place], MD

## 2019-10-30 DIAGNOSIS — R350 Frequency of micturition: Secondary | ICD-10-CM | POA: Diagnosis not present

## 2019-10-30 DIAGNOSIS — R351 Nocturia: Secondary | ICD-10-CM | POA: Diagnosis not present

## 2019-11-03 DIAGNOSIS — R928 Other abnormal and inconclusive findings on diagnostic imaging of breast: Secondary | ICD-10-CM | POA: Diagnosis not present

## 2019-11-25 DIAGNOSIS — M6289 Other specified disorders of muscle: Secondary | ICD-10-CM | POA: Diagnosis not present

## 2019-11-25 DIAGNOSIS — M6281 Muscle weakness (generalized): Secondary | ICD-10-CM | POA: Diagnosis not present

## 2019-11-25 DIAGNOSIS — M62838 Other muscle spasm: Secondary | ICD-10-CM | POA: Diagnosis not present

## 2019-11-25 DIAGNOSIS — R351 Nocturia: Secondary | ICD-10-CM | POA: Diagnosis not present

## 2019-12-03 DIAGNOSIS — R350 Frequency of micturition: Secondary | ICD-10-CM | POA: Diagnosis not present

## 2019-12-03 DIAGNOSIS — R351 Nocturia: Secondary | ICD-10-CM | POA: Diagnosis not present

## 2019-12-09 DIAGNOSIS — S0502XA Injury of conjunctiva and corneal abrasion without foreign body, left eye, initial encounter: Secondary | ICD-10-CM | POA: Diagnosis not present

## 2020-05-04 DIAGNOSIS — Z20822 Contact with and (suspected) exposure to covid-19: Secondary | ICD-10-CM | POA: Diagnosis not present

## 2020-06-19 DIAGNOSIS — Z20828 Contact with and (suspected) exposure to other viral communicable diseases: Secondary | ICD-10-CM | POA: Diagnosis not present

## 2020-06-24 ENCOUNTER — Other Ambulatory Visit: Payer: Self-pay

## 2020-06-24 ENCOUNTER — Telehealth (INDEPENDENT_AMBULATORY_CARE_PROVIDER_SITE_OTHER): Payer: BC Managed Care – PPO | Admitting: Adult Health

## 2020-06-24 DIAGNOSIS — J0140 Acute pansinusitis, unspecified: Secondary | ICD-10-CM

## 2020-06-24 MED ORDER — DOXYCYCLINE HYCLATE 100 MG PO CAPS: 100.0000 mg | ORAL_CAPSULE | Freq: Two times a day (BID) | ORAL | 0 refills | Status: DC

## 2020-06-24 NOTE — Patient Instructions (Signed)
° ° ° °  If you have lab work done today you will be contacted with your lab results within the next 2 weeks.  If you have not heard from us then please contact us. The fastest way to get your results is to register for My Chart. ° ° °IF you received an x-ray today, you will receive an invoice from [facility]. Please contact [facility] at [phone] with questions or concerns regarding your invoice.  ° °IF you received labwork today, you will receive an invoice from [facility]. Please contact [facility] at [phone] with questions or concerns regarding your invoice.  ° °Our billing staff will not be able to assist you with questions regarding bills from these companies. ° °You will be contacted with the lab results as soon as they are available. The fastest way to get your results is to activate your My Chart account. Instructions are located on the last page of this paperwork. If you have not heard from us regarding the results in 2 weeks, please contact this office. °  ° ° ° °

## 2020-06-24 NOTE — Progress Notes (Signed)
Virtual Visit via Video Note  I connected with Kathy Steele on 06/24/20 at  4:00 PM EDT by a video enabled telemedicine application and verified that I am speaking with the correct person using two identifiers.  Location patient: home Location provider:work or home office Persons participating in the virtual visit: patient, provider  I discussed the limitations of evaluation and management by telemedicine and the availability of in person appointments. The patient expressed understanding and agreed to proceed.   HPI: 44 year old female who is being evaluated today for an acute issue.  Her symptoms started roughly 5 to 7 days ago.  Symptoms started with a headache, sinus and facial pressure and some sinus drainage.  She was tested with a Covid PCR which came back negative.  Over the course of the week she has also developed a sore throat.  She has been taking various over-the-counter medications such as NSAIDs and Sudafed PE which help relieve her symptoms but minimally.  She also use Flonase which worked for about a day and then stopped working.  She denies any fevers or chills  Symptoms have not improved over the week   ROS: See pertinent positives and negatives per HPI.  Past Medical History:  Diagnosis Date  . Headache(784.0)   . History of chickenpox     No past surgical history on file.  Family History  Problem Relation Age of Onset  . Hypertension Mother   . Diabetes Paternal Uncle   . Diabetes Maternal Grandmother   . Stroke Maternal Grandmother   . Stroke Maternal Grandfather   . Arthritis Other   . Osteoarthritis Other        Current Outpatient Medications:  .  Aspirin-Acetaminophen-Caffeine (GOODYS EXTRA STRENGTH PO), Take 1 packet by mouth daily as needed (headache). , Disp: , Rfl:  .  BLACK CURRANT SEED OIL PO, Take by mouth., Disp: , Rfl:  .  cetirizine (ZYRTEC) 10 MG tablet, Take 10 mg by mouth daily., Disp: , Rfl:  .  ELDERBERRY PO, Take by mouth., Disp:  , Rfl:  .  fluticasone (FLONASE) 50 MCG/ACT nasal spray, Place 2 sprays into both nostrils daily., Disp: 16 g, Rfl: 0 .  Multiple Vitamin (MULTIVITAMIN WITH MINERALS) TABS tablet, Take 1 tablet by mouth daily., Disp: , Rfl:  .  naproxen sodium (ALEVE) 220 MG tablet, Take 220 mg by mouth as needed., Disp: , Rfl:  .  Prenatal Vit-Fe Fumarate-FA (PRENATAL MULTIVITAMIN) TABS tablet, Take 1 tablet by mouth daily at 12 noon., Disp: , Rfl:   EXAM:  VITALS per patient if applicable:  GENERAL: alert, oriented, appears ill.   HEENT: atraumatic, conjunttiva clear, no obvious abnormalities on inspection of external nose and ears  NECK: normal movements of the head and neck  LUNGS: on inspection no signs of respiratory distress, breathing rate appears normal, no obvious gross SOB, gasping or wheezing  CV: no obvious cyanosis  MS: moves all visible extremities without noticeable abnormality  PSYCH/NEURO: pleasant and cooperative, no obvious depression or anxiety, speech and thought processing grossly intact  ASSESSMENT AND PLAN:  Discussed the following assessment and plan:  1. Acute non-recurrent pansinusitis - doxycycline (VIBRAMYCIN) 100 MG capsule; Take 1 capsule (100 mg total) by mouth 2 (two) times daily.  Dispense: 14 capsule; Refill:  - Continue with NSAIDS to help with symptom relief.  - Rest and stay hydrated - Follow up if no improvement in the next 2-3 days    I discussed the assessment and treatment plan with the  patient. The patient was provided an opportunity to ask questions and all were answered. The patient agreed with the plan and demonstrated an understanding of the instructions.   The patient was advised to call back or seek an in-person evaluation if the symptoms worsen or if the condition fails to improve as anticipated.   Dorothyann Peng, NP

## 2020-07-11 ENCOUNTER — Other Ambulatory Visit: Payer: Self-pay

## 2020-07-11 ENCOUNTER — Ambulatory Visit
Admission: EM | Admit: 2020-07-11 | Discharge: 2020-07-11 | Disposition: A | Discharge status: Home / Self Care | Payer: BC Managed Care – PPO | Attending: Physician Assistant | Admitting: Physician Assistant

## 2020-07-11 DIAGNOSIS — J0140 Acute pansinusitis, unspecified: Secondary | ICD-10-CM

## 2020-07-11 MED ORDER — DOXYCYCLINE HYCLATE 100 MG PO CAPS: 100.0000 mg | ORAL_CAPSULE | Freq: Two times a day (BID) | ORAL | 0 refills | Status: AC

## 2020-07-11 MED ORDER — ONDANSETRON 4 MG PO TBDP: 4.0000 mg | ORAL_TABLET | Freq: Two times a day (BID) | ORAL | 0 refills | Status: DC

## 2020-07-11 MED ORDER — HYDROCODONE-HOMATROPINE 5-1.5 MG/5ML PO SYRP: 5.0000 mL | ORAL_SOLUTION | Freq: Four times a day (QID) | ORAL | 0 refills | Status: DC | PRN

## 2020-07-11 MED ORDER — PREDNISONE 50 MG PO TABS: 50.0000 mg | ORAL_TABLET | Freq: Every day | ORAL | 0 refills | Status: DC

## 2020-07-11 NOTE — ED Triage Notes (Signed)
Pt states she was treated for similar symptoms and was given doxy about 3 weeks ago but the med made her sick. She has now developed a headache, cough, and nasal congestion. Pt is aox4 and ambulatory.

## 2020-07-11 NOTE — ED Provider Notes (Signed)
[facility]    CSN: [id number] Arrival date & time: 07/11/20  1157      History   Chief Complaint Chief Complaint  Patient presents with  . Nasal Congestion    x 1 week  . Cough    x 1 week,semi productive  . Headache    x 1 week    HPI Kathy Steele is a 44 y.o. female.   44 year old female comes in for 3-4 week history of URI symptoms. Was given doxycycline 3 weeks ago, but was unable to tolerate due to nausea/vomiting. Since then, has been using otc medicine without relief. Continued cough, headache, nasal congestion. Denies fever. Since discontinuing doxycycline, has not had nausea/vomiting. Denies shortness of breath, loss of taste/smell.      Past Medical History:  Diagnosis Date  . Headache(784.0)   . History of chickenpox     Patient Active Problem List   Diagnosis Date Noted  . Other fatigue 09/28/2014  . Visit for preventive health examination 08/30/2011  . Flu vaccine need 08/30/2011  . Procreative management counseling 08/30/2011  . NECK PAIN 07/06/2010  . HEADACHE 07/06/2010  . 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 COMMON MIGRAINE 06/26/2008  . 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 IRREGULAR MENSTRUAL CYCLE 01/14/2008    History reviewed. No pertinent surgical history.  OB History    Gravida  1   Para  1   Term      Preterm      AB      Living        SAB      TAB      Ectopic      Multiple      Live Births               Home Medications    Prior to Admission medications   Medication Sig Start Date End Date Taking? Authorizing Provider  Aspirin-Acetaminophen-Caffeine (GOODYS EXTRA STRENGTH PO) Take 1 packet by mouth daily as needed (headache).    Yes [provider]  BLACK CURRANT SEED OIL PO Take by mouth.   Yes [provider]  cetirizine (ZYRTEC) 10 MG tablet Take 10 mg by mouth daily.   Yes [provider]  fluticasone (FLONASE) 50  MCG/ACT nasal spray Place 2 sprays into both nostrils daily. 10/22/18  Yes Lurline Idol, FNP  Multiple Vitamin (MULTIVITAMIN WITH MINERALS) TABS tablet Take 1 tablet by mouth daily.   Yes [provider]  naproxen sodium (ALEVE) 220 MG tablet Take 220 mg by mouth as needed.   Yes [provider]  doxycycline (VIBRAMYCIN) 100 MG capsule Take 1 capsule (100 mg total) by mouth 2 (two) times daily for 3 days. 07/11/20 07/14/20  Tamira Ryland V, PA-C  ELDERBERRY PO Take by mouth.    [provider]  HYDROcodone-homatropine (HYCODAN) 5-1.5 MG/5ML syrup Take 5 mLs by mouth every 6 (six) hours as needed for cough. 07/11/20   Cathie Hoops, Annalynne Ibanez V, PA-C  ondansetron (ZOFRAN ODT) 4 MG disintegrating tablet Take 1 tablet (4 mg total) by mouth 2 (two) times daily. 07/11/20   Cathie Hoops, Kriss Ishler V, PA-C  predniSONE (DELTASONE) 50 MG tablet Take 1 tablet (50 mg total) by mouth daily with breakfast. 07/11/20   Belinda Fisher, PA-C    Family History Family History  Problem Relation Age of Onset  . Hypertension Mother   . Diabetes Paternal Uncle   . Diabetes Maternal Grandmother   .  Stroke Maternal Grandmother   . Stroke Maternal Grandfather   . Arthritis Other   . Osteoarthritis Other     Social History Social History   Tobacco Use  . Smoking status: Former Smoker    Types: Cigarettes    Quit date: 05/09/2013    Years since quitting: 7.1  . Smokeless tobacco: Never Used  . Tobacco comment: Stopped 1 year ago  Vaping Use  . Vaping Use: Never used  Substance Use Topics  . Alcohol use: No    Alcohol/week: 0.0 standard drinks    Comment: occasional  . Drug use: No     Allergies   Escitalopram oxalate   Review of Systems Review of Systems  Reason unable to perform ROS: See HPI as above.     Physical Exam Triage Vital Signs ED Triage Vitals  Enc Vitals Group     BP 07/11/20 1432 121/85     Pulse Rate 07/11/20 1432 81     Resp 07/11/20 1432 17     Temp 07/11/20 1432 98.4 F (36.9 C)      Temp Source 07/11/20 1432 Oral     SpO2 07/11/20 1432 99 %     Weight --      Height --      Head Circumference --      Peak Flow --      Pain Score 07/11/20 1433 7     Pain Loc --      Pain Edu? --      Excl. in GC? --    No data found.  Updated Vital Signs BP 121/85 (BP Location: Left Arm)   Pulse 81   Temp 98.4 F (36.9 C) (Oral)   Resp 17   LMP 07/10/2020 (Exact Date)   SpO2 99%   Physical Exam Constitutional:      General: She is not in acute distress.    Appearance: Normal appearance. She is well-developed. She is not ill-appearing, toxic-appearing or diaphoretic.  HENT:     Head: Normocephalic and atraumatic.     Right Ear: Tympanic membrane, ear canal and external ear normal. Tympanic membrane is not erythematous or bulging.     Left Ear: Tympanic membrane, ear canal and external ear normal. Tympanic membrane is not erythematous or bulging.     Nose:     Right Sinus: Maxillary sinus tenderness and frontal sinus tenderness present.     Left Sinus: Maxillary sinus tenderness and frontal sinus tenderness present.     Mouth/Throat:     Mouth: Mucous membranes are moist.     Pharynx: Oropharynx is clear. Uvula midline.  Eyes:     Conjunctiva/sclera: Conjunctivae normal.     Pupils: Pupils are equal, round, and reactive to light.  Cardiovascular:     Rate and Rhythm: Normal rate and regular rhythm.  Pulmonary:     Effort: Pulmonary effort is normal. No accessory muscle usage, prolonged expiration, respiratory distress or retractions.     Breath sounds: No decreased air movement or transmitted upper airway sounds. No decreased breath sounds.     Comments: LCTAB Musculoskeletal:     Cervical back: Normal range of motion and neck supple.  Skin:    General: Skin is warm and dry.  Neurological:     Mental Status: She is alert and oriented to person, place, and time.      UC Treatments / Results  Labs (all labs ordered are listed, but only abnormal results are  displayed) Labs Reviewed -  No data to display  EKG   Radiology No results found.  Procedures Procedures (including critical care time)  Medications Ordered in UC Medications - No data to display  Initial Impression / Assessment and Plan / UC Course  I have reviewed the triage vital signs and the nursing notes.  Pertinent labs & imaging results that were available during my care of the patient were reviewed by me and considered in my medical decision making (see chart for details).    Doxycycline and prednisone as directed. zofran to help with nausea from doxy. Other symptomatic treatment discussed. Return precautions given.  Final Clinical Impressions(s) / UC Diagnoses   Final diagnoses:  Acute non-recurrent pansinusitis   ED Prescriptions    Medication Sig Dispense Auth. Provider   doxycycline (VIBRAMYCIN) 100 MG capsule Take 1 capsule (100 mg total) by mouth 2 (two) times daily for 3 days. 6 capsule Kenna Seward V, PA-C   predniSONE (DELTASONE) 50 MG tablet Take 1 tablet (50 mg total) by mouth daily with breakfast. 5 tablet Jesse Nosbisch V, PA-C   ondansetron (ZOFRAN ODT) 4 MG disintegrating tablet Take 1 tablet (4 mg total) by mouth 2 (two) times daily. 14 tablet Anastasiya Gowin V, PA-C   HYDROcodone-homatropine (HYCODAN) 5-1.5 MG/5ML syrup Take 5 mLs by mouth every 6 (six) hours as needed for cough. 60 mL Cathie Hoops, Dalyn Becker V, PA-C     I have reviewed the PDMP during this encounter.   Belinda Fisher, PA-C 07/11/20 1459

## 2020-07-11 NOTE — Discharge Instructions (Signed)
Restart doxycycline as directed, zofran prior to doxycycline to prevent vomiting. Prednisone as directed. Hycodan as needed for cough. Monitor for any worsening of symptoms, chest pain, shortness of breath, wheezing, swelling of the throat, go to the emergency department for further evaluation needed.

## 2020-07-26 DIAGNOSIS — Z113 Encounter for screening for infections with a predominantly sexual mode of transmission: Secondary | ICD-10-CM | POA: Diagnosis not present

## 2020-07-26 DIAGNOSIS — Z0000 Encounter for general adult medical examination without abnormal findings: Secondary | ICD-10-CM | POA: Diagnosis not present

## 2020-07-26 DIAGNOSIS — M79604 Pain in right leg: Secondary | ICD-10-CM | POA: Diagnosis not present

## 2020-08-06 ENCOUNTER — Encounter: Payer: BC Managed Care – PPO | Admitting: Internal Medicine

## 2020-08-06 ENCOUNTER — Encounter: Payer: BC Managed Care – PPO | Admitting: Family Medicine

## 2020-08-11 ENCOUNTER — Encounter: Payer: Self-pay | Admitting: Family Medicine

## 2020-08-11 ENCOUNTER — Other Ambulatory Visit: Payer: Self-pay

## 2020-08-11 ENCOUNTER — Other Ambulatory Visit: Payer: BC Managed Care – PPO

## 2020-08-11 ENCOUNTER — Ambulatory Visit (INDEPENDENT_AMBULATORY_CARE_PROVIDER_SITE_OTHER): Payer: BC Managed Care – PPO | Admitting: Family Medicine

## 2020-08-11 VITALS — BP 116/78 | HR 80 | Temp 98.3°F | Resp 12 | Ht 63.0 in | Wt 148.8 lb

## 2020-08-11 DIAGNOSIS — Z23 Encounter for immunization: Secondary | ICD-10-CM | POA: Diagnosis not present

## 2020-08-11 DIAGNOSIS — Z1159 Encounter for screening for other viral diseases: Secondary | ICD-10-CM

## 2020-08-11 DIAGNOSIS — Z130 Encounter for screening for diseases of the blood and blood-forming organs and certain disorders involving the immune mechanism: Secondary | ICD-10-CM

## 2020-08-11 DIAGNOSIS — Z1329 Encounter for screening for other suspected endocrine disorder: Secondary | ICD-10-CM

## 2020-08-11 DIAGNOSIS — E785 Hyperlipidemia, unspecified: Secondary | ICD-10-CM | POA: Diagnosis not present

## 2020-08-11 DIAGNOSIS — Z13228 Encounter for screening for other metabolic disorders: Secondary | ICD-10-CM

## 2020-08-11 DIAGNOSIS — N946 Dysmenorrhea, unspecified: Secondary | ICD-10-CM

## 2020-08-11 DIAGNOSIS — Z0000 Encounter for general adult medical examination without abnormal findings: Secondary | ICD-10-CM

## 2020-08-11 DIAGNOSIS — R3915 Urgency of urination: Secondary | ICD-10-CM

## 2020-08-11 LAB — LIPID PANEL
Cholesterol: 221 mg/dL — ABNORMAL HIGH (ref 0–200)
HDL: 64.8 mg/dL (ref >39.00)
LDL Cholesterol: 143 mg/dL — ABNORMAL HIGH (ref 0–99)
NonHDL: 155.82
Total CHOL/HDL Ratio: 3
Triglycerides: 62 mg/dL (ref 0.0–149.0)
VLDL: 12.4 mg/dL (ref 0.0–40.0)

## 2020-08-11 LAB — BASIC METABOLIC PANEL
BUN: 14 mg/dL (ref 6–23)
CO2: 27 mEq/L (ref 19–32)
Calcium: 9 mg/dL (ref 8.4–10.5)
Chloride: 105 mEq/L (ref 96–112)
Creatinine, Ser: 0.64 mg/dL (ref 0.40–1.20)
GFR: 109.33 mL/min (ref >60.00)
Glucose, Bld: 99 mg/dL (ref 70–99)
Potassium: 4 mEq/L (ref 3.5–5.1)
Sodium: 137 mEq/L (ref 135–145)

## 2020-08-11 LAB — HEMOGLOBIN A1C: Hgb A1c MFr Bld: 5.2 % (ref 4.6–6.5)

## 2020-08-11 NOTE — Patient Instructions (Addendum)
Today you have you routine preventive visit. A few things to remember from today's visit:   Routine general medical examination at a health care facility  Hyperlipidemia, unspecified hyperlipidemia type - Plan: Lipid panel  Encounter for HCV screening test for low risk patient - Plan: Hepatitis C antibody, BASIC METABOLIC PANEL WITH GFR, Hemoglobin A1c  Screening for endocrine, metabolic and immunity disorder  Dysmenorrhea  Urinary urgency Please be sure medication list is accurate. If a new problem present, please set up appointment sooner than planned today.  At least 150 minutes of moderate exercise per week, daily brisk walking for 15-30 min is a good exercise option. Healthy diet low in saturated (animal) fats and sweets and consisting of fresh fruits and vegetables, lean meats such as fish and white chicken and whole grains.  These are some of recommendations for screening depending of age and risk factors:  - Vaccines:  Tdap vaccine every 10 years.  Shingles vaccine recommended at age 37, could be given after 42 years of age but not sure about insurance coverage.   Pneumonia vaccines: Pneumovax at 65. Sometimes Pneumovax is giving earlier if history of smoking, lung disease,diabetes,kidney disease among some.  Screening for diabetes at age 61 and every 3 years.  Cervical cancer prevention:  Pap smear starts at 42 years of age and continues periodically until 42 years old in low risk women. Pap smear every 3 years between 49 and 26 years old. Pap smear every 3-5 years between women 30 and older if pap smear negative and HPV screening negative.   -Breast cancer: Mammogram: There is disagreement between experts about when to start screening in low risk asymptomatic female but recent recommendations are to start screening at 61 and not later than 42 years old , every 1-2 years and after 42 yo q 2 years. Screening is recommended until 42 years old but some women can continue  screening depending of healthy issues.  Colon cancer screening: Has been recently changed to 42 yo. Insurance may not cover until you are 42 years old. Screening is recommended until 42 years old.  Cholesterol disorder screening at age 43 and every 3 years.N/A  Also recommended:  1. Dental visit- Brush and floss your teeth twice daily; visit your dentist twice a year. 2. Eye doctor- Get an eye exam at least every 2 years. 3. Helmet use- Always wear a helmet when riding a bicycle, motorcycle, rollerblading or skateboarding. 4. Safe sex- If you may be exposed to sexually transmitted infections, use a condom. 5. Seat belts- Seat belts can save your live; always wear one. 6. Smoke/Carbon Monoxide detectors- These detectors need to be installed on the appropriate level of your home. Replace batteries at least once a year. 7. Skin cancer- When out in the sun please cover up and use sunscreen 15 SPF or higher. 8. Violence- If anyone is threatening or hurting you, please tell your healthcare provider.  9. Drink alcohol in moderation- Limit alcohol intake to one drink or less per day. Never drink and drive. 10. Calcium supplementation 1000 to 1200 mg daily, ideally through your diet.  Vitamin D supplementation 800 units daily.

## 2020-08-11 NOTE — Progress Notes (Signed)
HPI: Kathy Steele is a pleasant 44 y.o. female, who is here today for her routine physical.  Last CPE: 08/04/19  Regular exercise 3 or more time per week: She walks her dogs. Following a healthy diet: She is trying to do better. She lives with her husband,42 yo son, and 2 dogs.  Chronic medical problems: Migraine ,HLD,dysmenorrhea,and anxiety. She follows with dermatologist annually.  Pap smear:02/01/19. She sees gyn regularly, Dr Cherly Hensen  Immunization History  Administered Date(s) Administered  . Influenza Split 08/31/2011, 09/16/2012, 07/25/2013  . Influenza Whole 07/06/2010  . Influenza,inj,Quad PF,6+ Mos 07/30/2018, 08/04/2019, 08/11/2020  . PFIZER SARS-COV-2 Vaccination 12/31/2019, 02/18/2020  . Td 07/06/2010  . Tdap 08/11/2020   Mammogram: 08/2019. Colonoscopy: N/A DEXA: N/A Hep C screening: Never.  Concerns today: HLD: She is on non pharmacologic treatments.  Lab Results  Component Value Date   CHOL 221 (H) 08/04/2019   HDL 60.10 08/04/2019   LDLCALC 146 (H) 08/04/2019   LDLDIRECT 131.6 09/09/2013   TRIG 73.0 08/04/2019   CHOLHDL 4 08/04/2019   Lower back and pelvic pain associated with menses. Right LB pain, stubbing like pain, non radiated. Pain resolved while she was doing acupuncture and started back when she stopped treatments. She follows with gyn.  Urinary frequency: She has been evaluated by urologist, she does not want pharmacologic treatment for now. Negative for incontinence. Nocturia x 3-4. + Urgency. Negative for dysuria and gross hematuria.  Review of Systems  Constitutional: Negative for appetite change, diaphoresis and fever.  HENT: Negative for hearing loss, mouth sores and sore throat.   Eyes: Negative for redness and visual disturbance.  Respiratory: Negative for cough, shortness of breath and wheezing.   Cardiovascular: Negative for chest pain and leg swelling.  Gastrointestinal: Negative for abdominal pain, nausea and  vomiting.       No changes in bowel habits.  Endocrine: Negative for cold intolerance, heat intolerance, polydipsia, polyphagia and polyuria.  Genitourinary: Negative for decreased urine volume, vaginal bleeding and vaginal discharge.  Musculoskeletal: Positive for back pain. Negative for gait problem and myalgias.  Skin: Negative for color change and rash.  Allergic/Immunologic: Positive for environmental allergies.  Neurological: Negative for syncope, weakness and headaches.  Hematological: Negative for adenopathy. Does not bruise/bleed easily.  Psychiatric/Behavioral: Negative for confusion and sleep disturbance. The patient is not nervous/anxious.   All other systems reviewed and are negative.   Current Outpatient Medications on File Prior to Visit  Medication Sig Dispense Refill  . Aspirin-Acetaminophen-Caffeine (GOODYS EXTRA STRENGTH PO) Take 1 packet by mouth daily as needed (headache).     Marland Kitchen BLACK CURRANT SEED OIL PO Take by mouth.    . cetirizine (ZYRTEC) 10 MG tablet Take 10 mg by mouth daily.    Marland Kitchen ELDERBERRY PO Take by mouth.    . fluticasone (FLONASE) 50 MCG/ACT nasal spray Place 2 sprays into both nostrils daily. 16 g 0  . HYDROcodone-homatropine (HYCODAN) 5-1.5 MG/5ML syrup Take 5 mLs by mouth every 6 (six) hours as needed for cough. 60 mL 0  . Multiple Vitamin (MULTIVITAMIN WITH MINERALS) TABS tablet Take 1 tablet by mouth daily.    . naproxen sodium (ALEVE) 220 MG tablet Take 220 mg by mouth as needed.    . ondansetron (ZOFRAN ODT) 4 MG disintegrating tablet Take 1 tablet (4 mg total) by mouth 2 (two) times daily. 14 tablet 0  . predniSONE (DELTASONE) 50 MG tablet Take 1 tablet (50 mg total) by mouth daily with breakfast. 5 tablet  0   No current facility-administered medications on file prior to visit.   Past Medical History:  Diagnosis Date  . Headache(784.0)   . History of chickenpox    History reviewed. No pertinent surgical history.  Allergies  Allergen  Reactions  . Escitalopram Oxalate     REACTION: Nausea, Ha and felt spacy    Family History  Problem Relation Age of Onset  . Hypertension Mother   . Diabetes Paternal Uncle   . Diabetes Maternal Grandmother   . Stroke Maternal Grandmother   . Stroke Maternal Grandfather   . Arthritis Other   . Osteoarthritis Other     Social History   Socioeconomic History  . Marital status: Married    Spouse name: Not on file  . Number of children: Not on file  . Years of education: Not on file  . Highest education level: Not on file  Occupational History  . Not on file  Tobacco Use  . Smoking status: Former Smoker    Types: Cigarettes    Quit date: 05/09/2013    Years since quitting: 7.2  . Smokeless tobacco: Never Used  . Tobacco comment: Stopped 1 year ago  Vaping Use  . Vaping Use: Never used  Substance and Sexual Activity  . Alcohol use: No    Alcohol/week: 0.0 standard drinks    Comment: occasional  . Drug use: No  . Sexual activity: Not on file  Other Topics Concern  . 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 Difficulty of Paying Living Expenses: Not on file  Food Insecurity:   . Worried About Programme researcher, broadcasting/film/video in the Last Year: Not on file  . Ran Out of Food in the Last Year: Not on file  Transportation Needs:   . Lack of Transportation (Medical): Not on file  . Lack of Transportation (Non-Medical): Not on file  Physical Activity:   . Days of Exercise per Week: Not on file  . Minutes of Exercise per Session: Not on file  Stress:   . Feeling of Stress : Not on file  Social Connections:   . Frequency of Communication with  Friends and Family: Not on file  . Frequency of Social Gatherings with Friends and Family: Not on file  . Attends Religious Services: Not on file  . Active Member of Clubs or Organizations: Not on file  . Attends Banker Meetings: Not on file  . Marital Status: Not on file    Vitals:   08/11/20 0918  BP: 116/78  Pulse: 80  Resp: 12  Temp: 98.3 F (36.8 C)  SpO2: 97%   Body mass index is 26.36 kg/m.   Wt Readings from Last 3 Encounters:  08/11/20 148 lb 12.8 oz (67.5 kg)  08/04/19 153 lb 9.6 oz (69.7 kg)  07/30/18 148 lb 14.4 oz (67.5 kg)   Physical Exam Vitals and nursing note reviewed.  Constitutional:      General: She is not  in acute distress.    Appearance: She is well-developed.  HENT:     Head: Normocephalic and atraumatic.     Right Ear: Hearing, tympanic membrane, ear canal and external ear normal.     Left Ear: Hearing, tympanic membrane, ear canal and external ear normal.     Mouth/Throat:     Mouth: Mucous membranes are moist.     Pharynx: Oropharynx is clear. Uvula midline.  Eyes:     Extraocular Movements: Extraocular movements intact.     Conjunctiva/sclera: Conjunctivae normal.     Pupils: Pupils are equal, round, and reactive to light.  Neck:     Thyroid: No thyromegaly.     Trachea: No tracheal deviation.  Cardiovascular:     Rate and Rhythm: Normal rate and regular rhythm.     Pulses:          Dorsalis pedis pulses are 2+ on the right side and 2+ on the left side.     Heart sounds: No murmur heard.   Pulmonary:     Effort: Pulmonary effort is normal. No respiratory distress.     Breath sounds: Normal breath sounds.  Abdominal:     Palpations: Abdomen is soft. There is no hepatomegaly or mass.     Tenderness: There is no abdominal tenderness.  Genitourinary:    Comments: Deferred to gyn. Musculoskeletal:     Comments: No major deformity or signs of synovitis appreciated.  Lymphadenopathy:     Cervical: No cervical adenopathy.      Upper Body:     Right upper body: No supraclavicular adenopathy.     Left upper body: No supraclavicular adenopathy.  Skin:    General: Skin is warm.     Findings: No erythema or rash.  Neurological:     Mental Status: She is alert and oriented to person, place, and time.     Cranial Nerves: No cranial nerve deficit.     Coordination: Coordination normal.     Gait: Gait normal.     Deep Tendon Reflexes:     Reflex Scores:      Bicep reflexes are 2+ on the right side and 2+ on the left side.      Patellar reflexes are 2+ on the right side and 2+ on the left side. Psychiatric:        Speech: Speech normal.     Comments: Well groomed, good eye contact.   ASSESSMENT AND PLAN:  Ms. ANTRICE PAL was here today annual physical examination.  Orders Placed This Encounter  Procedures  . Tdap vaccine greater than or equal to 7yo IM  . Flu Vaccine QUAD 36+ mos IM  . Basic Metabolic Panel  . Hemoglobin A1c  . Hep C Antibody  . Lipid panel   Lab Results  Component Value Date   CHOL 221 (H) 08/11/2020   HDL 64.80 08/11/2020   LDLCALC 143 (H) 08/11/2020   LDLDIRECT 131.6 09/09/2013   TRIG 62.0 08/11/2020   CHOLHDL 3 08/11/2020   Lab Results  Component Value Date   HGBA1C 5.2 08/11/2020   Lab Results  Component Value Date   CREATININE 0.64 08/11/2020   BUN 14 08/11/2020   NA 137 08/11/2020   K 4.0 08/11/2020   CL 105 08/11/2020   CO2 27 08/11/2020   Routine general medical examination at a health care facility She understands the importance of regular physical activity and healthy diet for prevention of chronic illness and/or complications. Preventive guidelines reviewed. Vaccination  updated. Female preventive care to continue with gyn. Next CPE in a year. The 10-year ASCVD risk score Denman George(Goff DC Montez HagemanJr., et al., 2013) is: 0.3%   Values used to calculate the score:     Age: [age] years     Sex: Female     Is Non-Hispanic African American: Yes     Diabetic: No      Tobacco smoker: No     Systolic Blood Pressure: 116 mmHg     Is BP treated: No     HDL Cholesterol: 64.8 mg/dL     Total Cholesterol: 221 mg/dL  Hyperlipidemia, unspecified hyperlipidemia type Continue non pharmacologic treatment. Further recommendations according to lab results.  Encounter for HCV screening test for low risk patient -     Hep C Antibody; Future  Screening for endocrine, metabolic and immunity disorder -     Hemoglobin A1c; Future  Dysmenorrhea We discussed a few treatment options. She prefers non pharmacologic treatments,hormonal. Acupuncture helped tremendously, recommend resuming treatment , if possible.  Urinary urgency Overactive bladder, Dx discussed as well as prognosis and treatment options. She is not interested in pharmacologic treatment for now. Kegel and pelvic floor exercises may help.  Need for Tdap vaccination -     Tdap vaccine greater than or equal to 7yo IM  Need for influenza vaccination -     Flu Vaccine QUAD 36+ mos IM  Return in 1 year (on 08/11/2021) for cpe.  Ayah Cozzolino G. Swazil[place], MD  [facility]. [place] office.  Today you have you routine preventive visit. A few things to remember from today's visit:   Routine general medical examination at a health care facility  Hyperlipidemia, unspecified hyperlipidemia type - Plan: Lipid panel  Encounter for HCV screening test for low risk patient - Plan: Hepatitis C antibody, BASIC METABOLIC PANEL WITH GFR, Hemoglobin A1c  Screening for endocrine, metabolic and immunity disorder  Dysmenorrhea  Urinary urgency Please be sure medication list is accurate. If a new problem present, please set up appointment sooner than planned today.  At least 150 minutes of moderate exercise per week, daily brisk walking for 15-30 min is a good exercise option. Healthy diet low in saturated (animal) fats and sweets and consisting of fresh fruits and vegetables, lean meats such as fish and  white chicken and whole grains.  These are some of recommendations for screening depending of age and risk factors:  - Vaccines:  Tdap vaccine every 10 years.  Shingles vaccine recommended at age 42, could be given after 42 years of age but not sure about insurance coverage.   Pneumonia vaccines: Pneumovax at 65. Sometimes Pneumovax is giving earlier if history of smoking, lung disease,diabetes,kidney disease among some.  Screening for diabetes at age 42 and every 3 years.  Cervical cancer prevention:  Pap smear starts at 42 years of age and continues periodically until 42 years old in low risk women. Pap smear every 3 years between 1221 and 42 years old. Pap smear every 3-5 years between women 30 and older if pap smear negative and HPV screening negative.   -Breast cancer: Mammogram: There is disagreement between experts about when to start screening in low risk asymptomatic female but recent recommendations are to start screening at 3240 and not later than 42 years old , every 1-2 years and after 42 yo q 2 years. Screening is recommended until 42 years old but some women can continue screening depending of healthy issues.  Colon cancer screening: Has been recently  changed to 42 yo. Insurance may not cover until you are 42 years old. Screening is recommended until 42 years old.  Cholesterol disorder screening at age 17 and every 3 years.N/A  Also recommended:  1. Dental visit- Brush and floss your teeth twice daily; visit your dentist twice a year. 2. Eye doctor- Get an eye exam at least every 2 years. 3. Helmet use- Always wear a helmet when riding a bicycle, motorcycle, rollerblading or skateboarding. 4. Safe sex- If you may be exposed to sexually transmitted infections, use a condom. 5. Seat belts- Seat belts can save your live; always wear one. 6. Smoke/Carbon Monoxide detectors- These detectors need to be installed on the appropriate level of your home. Replace batteries at least  once a year. 7. Skin cancer- When out in the sun please cover up and use sunscreen 15 SPF or higher. 8. Violence- If anyone is threatening or hurting you, please tell your healthcare provider.  9. Drink alcohol in moderation- Limit alcohol intake to one drink or less per day. Never drink and drive. 10. Calcium supplementation 1000 to 1200 mg daily, ideally through your diet.  Vitamin D supplementation 800 units daily.

## 2020-08-12 LAB — HEPATITIS C ANTIBODY
Hepatitis C Ab: NONREACTIVE
SIGNAL TO CUT-OFF: 0.01 (ref <1.00)

## 2020-09-06 DIAGNOSIS — Z01419 Encounter for gynecological examination (general) (routine) without abnormal findings: Secondary | ICD-10-CM | POA: Diagnosis not present

## 2020-09-06 DIAGNOSIS — Z124 Encounter for screening for malignant neoplasm of cervix: Secondary | ICD-10-CM | POA: Diagnosis not present

## 2020-09-06 DIAGNOSIS — R928 Other abnormal and inconclusive findings on diagnostic imaging of breast: Secondary | ICD-10-CM | POA: Diagnosis not present

## 2020-10-14 DIAGNOSIS — R87615 Unsatisfactory cytologic smear of cervix: Secondary | ICD-10-CM | POA: Diagnosis not present

## 2020-10-17 ENCOUNTER — Encounter: Payer: Self-pay | Admitting: Family Medicine

## 2020-12-25 ENCOUNTER — Ambulatory Visit: Payer: Self-pay

## 2020-12-25 ENCOUNTER — Ambulatory Visit (INDEPENDENT_AMBULATORY_CARE_PROVIDER_SITE_OTHER): Payer: BC Managed Care – PPO

## 2020-12-25 ENCOUNTER — Other Ambulatory Visit: Payer: Self-pay

## 2020-12-25 ENCOUNTER — Ambulatory Visit
Admission: EM | Admit: 2020-12-25 | Discharge: 2020-12-25 | Disposition: A | Discharge status: Home / Self Care | Payer: BC Managed Care – PPO | Attending: Family Medicine | Admitting: Family Medicine

## 2020-12-25 DIAGNOSIS — R2242 Localized swelling, mass and lump, left lower limb: Secondary | ICD-10-CM | POA: Diagnosis not present

## 2020-12-25 DIAGNOSIS — M79672 Pain in left foot: Secondary | ICD-10-CM

## 2020-12-25 MED ORDER — PREDNISONE 20 MG PO TABS: 40.0000 mg | ORAL_TABLET | Freq: Every day | ORAL | 0 refills | Status: DC

## 2020-12-25 MED ORDER — TIZANIDINE HCL 4 MG PO TABS: 4.0000 mg | ORAL_TABLET | Freq: Four times a day (QID) | ORAL | 0 refills | Status: DC | PRN

## 2020-12-25 MED ORDER — KETOROLAC TROMETHAMINE 30 MG/ML IJ SOLN
30.0000 mg | Freq: Once | INTRAMUSCULAR | Status: AC
  Administered 2020-12-25: 30 mg via INTRAMUSCULAR

## 2020-12-25 MED ORDER — TIZANIDINE HCL 4 MG PO TABS: 4.0000 mg | ORAL_TABLET | Freq: Three times a day (TID) | ORAL | 0 refills | Status: DC | PRN

## 2020-12-25 NOTE — ED Triage Notes (Signed)
Pt present left foot pain. Pt states on Thursday woke and was unable to put weight on her left foot.  The foot is swollen and painful to the touch.

## 2020-12-25 NOTE — ED Provider Notes (Signed)
[facility]    CSN: [id number] Arrival date & time: 12/25/20  1205      History   Chief Complaint Chief Complaint  Patient presents with  . Foot Pain    HPI Kathy Steele is a 45 y.o. female.   HPI  Patient in with acute onset left foot pain x 3 days. No known injury. Initially pain was mild and gradually increased to involve the entire foot and now she is unable to bear weight.  She has no localized tenderness to digits 2 through 4 however the pain expands to the great toe and she is unable to bend her toes as this causes diffuse pain to the dorsum and palmar surface of her foot.  She has no history of gout.  Endorses some mild bruising yesterday which has resolved.  She has erythema at the proximal, palmar surface of the right toe. She has never had prior issues with her foot.  She has taken ibuprofen  without relief of pain.  Past Medical History:  Diagnosis Date  . Headache(784.0)   . History of chickenpox     Patient Active Problem List   Diagnosis Date Noted  . Other fatigue 09/28/2014  . Visit for preventive health examination 08/30/2011  . Flu vaccine need 08/30/2011  . Procreative management counseling 08/30/2011  . NECK PAIN 07/06/2010  . HEADACHE 07/06/2010  . 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 COMMON MIGRAINE 06/26/2008  . 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 IRREGULAR MENSTRUAL CYCLE 01/14/2008    History reviewed. No pertinent surgical history.  OB History    Gravida  1   Para  1   Term      Preterm      AB      Living        SAB      IAB      Ectopic      Multiple      Live Births               Home Medications    Prior to Admission medications   Medication Sig Start Date End Date Taking? Authorizing Provider  predniSONE (DELTASONE) 20 MG tablet Take 2 tablets (40 mg total) by mouth daily with breakfast. 12/25/20  Yes Bing Neighbors, FNP   Aspirin-Acetaminophen-Caffeine (GOODYS EXTRA STRENGTH PO) Take 1 packet by mouth daily as needed (headache).     [provider]  BLACK CURRANT SEED OIL PO Take by mouth.    [provider]  cetirizine (ZYRTEC) 10 MG tablet Take 10 mg by mouth daily.    [provider]  ELDERBERRY PO Take by mouth.    [provider]  fluticasone (FLONASE) 50 MCG/ACT nasal spray Place 2 sprays into both nostrils daily. 10/22/18   Lurline Idol, FNP  HYDROcodone-homatropine (HYCODAN) 5-1.5 MG/5ML syrup Take 5 mLs by mouth every 6 (six) hours as needed for cough. 07/11/20   Cathie Hoops, Amy V, PA-C  Multiple Vitamin (MULTIVITAMIN WITH MINERALS) TABS tablet Take 1 tablet by mouth daily.    [provider]  naproxen sodium (ALEVE) 220 MG tablet Take 220 mg by mouth as needed.    [provider]  ondansetron (ZOFRAN ODT) 4 MG disintegrating tablet Take 1 tablet (4 mg total) by mouth 2 (two) times daily. 07/11/20   Cathie Hoops, Amy V, PA-C  tiZANidine (ZANAFLEX) 4 MG tablet Take 1 tablet (4 mg total) by mouth every 8 (eight) hours as  needed (Foot pain). 12/25/20   Bing Neighbors, FNP    Family History Family History  Problem Relation Age of Onset  . Hypertension Mother   . Diabetes Paternal Uncle   . Diabetes Maternal Grandmother   . Stroke Maternal Grandmother   . Stroke Maternal Grandfather   . Arthritis Other   . Osteoarthritis Other     Social History Social History   Tobacco Use  . Smoking status: Former Smoker    Types: Cigarettes    Quit date: 05/09/2013    Years since quitting: 7.6  . Smokeless tobacco: Never Used  . Tobacco comment: Stopped 1 year ago  Vaping Use  . Vaping Use: Never used  Substance Use Topics  . Alcohol use: No    Alcohol/week: 0.0 standard drinks    Comment: occasional  . Drug use: No     Allergies   Escitalopram oxalate   Review of Systems Review of Systems Pertinent negatives listed in HPI   Physical Exam Triage  Vital Signs ED Triage Vitals  Enc Vitals Group     BP 12/25/20 1305 119/78     Pulse Rate 12/25/20 1305 78     Resp 12/25/20 1305 18     Temp 12/25/20 1305 98.9 F (37.2 C)     Temp Source 12/25/20 1305 Oral     SpO2 12/25/20 1305 100 %     Weight --      Height --      Head Circumference --      Peak Flow --      Pain Score 12/25/20 1301 10     Pain Loc --      Pain Edu? --      Excl. in GC? --    No data found.  Updated Vital Signs BP 119/78 (BP Location: Right Arm)   Pulse 78   Temp 98.9 F (37.2 C) (Oral)   Resp 18   LMP 12/25/2020   SpO2 100%   Visual Acuity Right Eye Distance:   Left Eye Distance:   Bilateral Distance:    Right Eye Near:   Left Eye Near:    Bilateral Near:     Physical Exam Constitutional:      Appearance: Normal appearance.  Cardiovascular:     Rate and Rhythm: Normal rate and regular rhythm.  Pulmonary:     Effort: Pulmonary effort is normal.     Breath sounds: Normal breath sounds.  Musculoskeletal:     Left foot: Decreased range of motion.       Feet:  Feet:     Comments: Swelling with diffuse tenderness at the dorsum and palmar surface of foot.  Psychiatric:        Behavior: Behavior is cooperative.      UC Treatments / Results  Labs (all labs ordered are listed, but only abnormal results are displayed) Labs Reviewed - No data to display  EKG   Radiology DG Foot Complete Left  Result Date: 12/25/2020 CLINICAL DATA:  Left foot pain 3 days. No injury. Pain over first metatarsal. EXAM: LEFT FOOT - COMPLETE 3+ VIEW COMPARISON:  None. FINDINGS: There is no evidence of fracture or dislocation. There is no evidence of arthropathy or other focal bone abnormality. Soft tissues are unremarkable. IMPRESSION: Negative. Electronically Signed   By: Elberta Fortis M.D.   On: 12/25/2020 13:27    Procedures Procedures (including critical care time)  Medications Ordered in UC Medications  ketorolac (TORADOL) 30 MG/ML injection 30  mg (has no administration in time range)  ketorolac (TORADOL) 30 MG/ML injection 30 mg (has no administration in time range)    Initial Impression / Assessment and Plan / UC Course  I have reviewed the triage vital signs and the nursing notes.  Pertinent labs & imaging results that were available during my care of the patient were reviewed by me and considered in my medical decision making (see chart for details).     Acute left pain with localized left foot swelling, unknown etiology.  Treating with a course of prednisone to reduce inflammation and swelling.  Tizanidine 4 mg every 8 hours as needed for pain. Toradol 60 mg IM given here in clinical for pain.  Patient will follow up with Dr. Clare Gandy on Monday for further evaluation. She will continue wear of personal cam walker boot and dispensed crutches to assist with ambulation. Patient verbalized understanding and agreement with plan. Final Clinical Impressions(s) / UC Diagnoses   Final diagnoses:  Acute pain of left foot  Localized swelling of left foot   Discharge Instructions   None    ED Prescriptions    Medication Sig Dispense Auth. Provider   predniSONE (DELTASONE) 20 MG tablet Take 2 tablets (40 mg total) by mouth daily with breakfast. 10 tablet Bing Neighbors, FNP   tiZANidine (ZANAFLEX) 4 MG tablet  (Status: Discontinued) Take 1 tablet (4 mg total) by mouth every 6 (six) hours as needed for muscle spasms. 30 tablet Bing Neighbors, FNP   tiZANidine (ZANAFLEX) 4 MG tablet Take 1 tablet (4 mg total) by mouth every 8 (eight) hours as needed (Foot pain). 30 tablet Bing Neighbors, FNP     PDMP not reviewed this encounter.   Bing Neighbors, FNP 12/25/20 [id number]

## 2020-12-27 ENCOUNTER — Other Ambulatory Visit: Payer: Self-pay

## 2020-12-27 ENCOUNTER — Encounter: Payer: Self-pay | Admitting: Family Medicine

## 2020-12-27 ENCOUNTER — Ambulatory Visit: Payer: BC Managed Care – PPO | Admitting: Family Medicine

## 2020-12-27 ENCOUNTER — Ambulatory Visit: Payer: Self-pay

## 2020-12-27 VITALS — BP 110/76 | Ht 63.0 in | Wt 148.0 lb

## 2020-12-27 DIAGNOSIS — M79672 Pain in left foot: Secondary | ICD-10-CM

## 2020-12-27 DIAGNOSIS — S92812A Other fracture of left foot, initial encounter for closed fracture: Secondary | ICD-10-CM

## 2020-12-27 DIAGNOSIS — M222X2 Patellofemoral disorders, left knee: Secondary | ICD-10-CM | POA: Diagnosis not present

## 2020-12-27 MED ORDER — AMBULATORY NON FORMULARY MEDICATION: 0 refills | Status: DC

## 2020-12-27 NOTE — Progress Notes (Signed)
Kathy Steele - 45 y.o. female MRN [id number]  Date of birth: 06-04-1976  SUBJECTIVE:  Including CC & ROS.  No chief complaint on file.   Kathy Steele is a 45 y.o. female that is presenting with acute left foot pain and swelling.  She also presents with acute left knee pain.  Left foot pain started over the course of the past week and has gotten worse.  She is having pain on the medial aspect of her first metacarpal.  Having pain anteriorly of the left knee..  Independent review of the left foot x-ray from 4/2 shows no acute changes.   Review of Systems See HPI   HISTORY: Past Medical, Surgical, Social, and Family History Reviewed & Updated per EMR.   Pertinent Historical Findings include:  Past Medical History:  Diagnosis Date  . Headache(784.0)   . History of chickenpox     No past surgical history on file.  Family History  Problem Relation Age of Onset  . Hypertension Mother   . Diabetes Paternal Uncle   . Diabetes Maternal Grandmother   . Stroke Maternal Grandmother   . Stroke Maternal Grandfather   . Arthritis Other   . Osteoarthritis Other     Social History   Socioeconomic History  . Marital status: Married    Spouse name: Not on file  . Number of children: Not on file  . Years of education: Not on file  . Highest education level: Not on file  Occupational History  . Not on file  Tobacco Use  . Smoking status: Former Smoker    Types: Cigarettes    Quit date: 05/09/2013    Years since quitting: 7.6  . Smokeless tobacco: Never Used  . Tobacco comment: Stopped 1 year ago  Vaping Use  . Vaping Use: Never used  Substance and Sexual Activity  . Alcohol use: No    Alcohol/week: 0.0 standard drinks    Comment: occasional  . Drug use: No  . Sexual activity: Not on file  Other Topics Concern  . 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  [employer]    Regular exercise- no   Sleep reg  Hours   Working hr job in [employer]                Social Determinants of Corporate investment banker Strain: Not on Ship broker Insecurity: Not on file  Transportation Needs: Not on file  Physical Activity: Not on file  Stress: Not on file  Social Connections: Not on file  Intimate Partner Violence: Not on file     PHYSICAL EXAM:  VS: BP 110/76 (BP Location: Left Arm, Patient Position: Sitting, Cuff Size: Normal)   Ht 5\' 3"  (1.6 m)   Wt 148 lb (67.1 kg)   LMP 12/25/2020   BMI 26.22 kg/m  Physical Exam Gen: NAD, alert, cooperative with exam, well-appearing MSK:  Left foot: Obvious swelling and tenderness palpation of the first metatarsal. Pain with weightbearing. Limited range of motion. Swelling over the dorsum of the foot. Normal ankle range of motion. Neurovascular intact  Limited ultrasound: Left foot, left knee:  Left foot: No effusion within the ankle joint No changes at the insertion of the posterior tibialis.  No changes at the base of the first metatarsal. Obvious soft tissue swelling but no cobblestoning. Increased hyperemia at the tibial sesamoid as well as the medial aspect of the first metatarsal.  Left knee: No obvious effusion. Normal-appearing quadricep and patellar tendon. Medial compartment appears fairly normal.  Summary: Findings suggest fracture of the tibial sided sesamoid.  No significant changes at the knee.  Ultrasound and interpretation by Clare Gandy, MD    ASSESSMENT & PLAN:   Closed fracture of sesamoid bone of left foot She has significant swelling and pain over the medial forefoot.  It seems that the sesamoid is the issue as opposed to the first metatarsal.  Has significant pain with weightbearing. -Counseled on home exercise therapy and supportive care. -Cam walker. -Rolling knee scooter. -Follow-up in 3  weeks. -Could consider further imaging.  Patellofemoral pain syndrome of left knee She walks a lot with her dogs and her ultrasound of her knee was reassuring. -Counseled on home exercise therapy and supportive care. -Could consider physical therapy

## 2020-12-27 NOTE — Patient Instructions (Signed)
Nice to meet you Please try the exercises for the knee  Please try the boot  Please elevate and ice the foot as needed  Please send me a message in MyChart with any questions or updates.  Please see me back in 3 weeks.   --Dr. Jordan Likes

## 2020-12-28 DIAGNOSIS — S92812A Other fracture of left foot, initial encounter for closed fracture: Secondary | ICD-10-CM | POA: Insufficient documentation

## 2020-12-28 DIAGNOSIS — M222X2 Patellofemoral disorders, left knee: Secondary | ICD-10-CM | POA: Insufficient documentation

## 2020-12-28 NOTE — Assessment & Plan Note (Signed)
She has significant swelling and pain over the medial forefoot.  It seems that the sesamoid is the issue as opposed to the first metatarsal.  Has significant pain with weightbearing. -Counseled on home exercise therapy and supportive care. -Cam walker. -Rolling knee scooter. -Follow-up in 3 weeks. -Could consider further imaging.

## 2020-12-28 NOTE — Assessment & Plan Note (Signed)
She walks a lot with her dogs and her ultrasound of her knee was reassuring. -Counseled on home exercise therapy and supportive care. -Could consider physical therapy

## 2020-12-29 ENCOUNTER — Encounter: Payer: Self-pay | Admitting: Family Medicine

## 2021-01-17 ENCOUNTER — Ambulatory Visit: Payer: BC Managed Care – PPO | Admitting: Family Medicine

## 2021-01-17 ENCOUNTER — Other Ambulatory Visit: Payer: Self-pay

## 2021-01-17 ENCOUNTER — Encounter: Payer: Self-pay | Admitting: Family Medicine

## 2021-01-17 VITALS — BP 120/68 | Ht 63.0 in | Wt 148.0 lb

## 2021-01-17 DIAGNOSIS — S92812D Other fracture of left foot, subsequent encounter for fracture with routine healing: Secondary | ICD-10-CM | POA: Diagnosis not present

## 2021-01-17 NOTE — Patient Instructions (Signed)
Good to see you Please try ice  Please try the pennsaid  Please try physical therapy   Please send me a message in MyChart with any questions or updates.  Please see me back in 2-3 weeks.   --Dr. Jordan Likes

## 2021-01-17 NOTE — Progress Notes (Signed)
Medication Samples have been provided to the patient.  Drug name: Pennsaid       Strength: 2%       Qty: 2 boxes  LOT: [id number]  Exp.Date: 02/2022  Dosing instructions: use a pea sized amount   The patient has been instructed regarding the correct time, dose, and frequency of taking this medication, including desired effects and most common side effects.   Lanier Prude 5:03 PM 01/17/2021

## 2021-01-17 NOTE — Progress Notes (Signed)
Kathy Steele - 45 y.o. female MRN [id number]  Date of birth: 05-Aug-1976  SUBJECTIVE:  Including CC & ROS.  No chief complaint on file.   Kathy Steele is a 45 y.o. female that is presenting with ongoing pain of her left foot.  Having swelling and tenderness mainly over the first MTP joint.  Has been able to get out of the cam walker and ambulate more normal.  She is still putting most of the weight on the lateral foot.   Review of Systems See HPI   HISTORY: Past Medical, Surgical, Social, and Family History Reviewed & Updated per EMR.   Pertinent Historical Findings include:  Past Medical History:  Diagnosis Date  . Headache(784.0)   . History of chickenpox     History reviewed. No pertinent surgical history.  Family History  Problem Relation Age of Onset  . Hypertension Mother   . Diabetes Paternal Uncle   . Diabetes Maternal Grandmother   . Stroke Maternal Grandmother   . Stroke Maternal Grandfather   . Arthritis Other   . Osteoarthritis Other     Social History   Socioeconomic History  . Marital status: Married    Spouse name: Not on file  . Number of children: Not on file  . Years of education: Not on file  . Highest education level: Not on file  Occupational History  . Not on file  Tobacco Use  . Smoking status: Former Smoker    Types: Cigarettes    Quit date: 05/09/2013    Years since quitting: 7.7  . Smokeless tobacco: Never Used  . Tobacco comment: Stopped 1 year ago  Vaping Use  . Vaping Use: Never used  Substance and Sexual Activity  . Alcohol use: No    Alcohol/week: 0.0 standard drinks    Comment: occasional  . Drug use: No  . Sexual activity: Not on file  Other Topics Concern  . 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[employer]    Regular exercise- no    Sleep reg  Hours   Working hr job in [employer]                Social Determinants of Corporate investment banker Strain: Not on Ship broker Insecurity: Not on file  Transportation Needs: Not on file  Physical Activity: Not on file  Stress: Not on file  Social Connections: Not on file  Intimate Partner Violence: Not on file     PHYSICAL EXAM:  VS: BP 120/68 (BP Location: Left Arm, Patient Position: Sitting, Cuff Size: Normal)   Ht 5\' 3"  (1.6 m)   Wt 148 lb (67.1 kg)   LMP 12/25/2020   BMI 26.22 kg/m  Physical Exam Gen: NAD, alert, cooperative with exam, well-appearing MSK:  Left foot: Limited range of motion of the first MTP joint. Swelling of the first MTP joint. Tenderness palpation over the medial aspect of the first MTP joint. Neurovascular intact     ASSESSMENT & PLAN:   Closed fracture of sesamoid bone of left foot May have a component of capsulitis of the joint as to why she is having ongoing pain.  She has been able to bear more weight in the foot. -Counseled on  home exercise therapy and supportive care. -Green sport insoles with first ray post on the left. -Referral to physical therapy. -May need to consider injection or further imaging

## 2021-01-18 NOTE — Assessment & Plan Note (Signed)
May have a component of capsulitis of the joint as to why she is having ongoing pain.  She has been able to bear more weight in the foot. -Counseled on home exercise therapy and supportive care. -Green sport insoles with first ray post on the left. -Referral to physical therapy. -May need to consider injection or further imaging

## 2021-01-26 ENCOUNTER — Ambulatory Visit: Payer: BC Managed Care – PPO | Attending: Family Medicine | Admitting: Physical Therapy

## 2021-01-26 ENCOUNTER — Other Ambulatory Visit: Payer: Self-pay

## 2021-01-26 ENCOUNTER — Encounter: Payer: Self-pay | Admitting: Physical Therapy

## 2021-01-26 DIAGNOSIS — R600 Localized edema: Secondary | ICD-10-CM

## 2021-01-26 DIAGNOSIS — R262 Difficulty in walking, not elsewhere classified: Secondary | ICD-10-CM | POA: Diagnosis not present

## 2021-01-26 DIAGNOSIS — M25675 Stiffness of left foot, not elsewhere classified: Secondary | ICD-10-CM | POA: Diagnosis not present

## 2021-01-26 DIAGNOSIS — M79675 Pain in left toe(s): Secondary | ICD-10-CM | POA: Diagnosis not present

## 2021-01-26 NOTE — Therapy (Signed)
[facility] [address] [place], [place], [address] Phone: [phone]   Fax:  [phone]  Physical Therapy Evaluation  Patient Details  Name: Kathy Steele MRN: [id number] Date of Birth: 1976/04/20 Referring Provider (PT): Lenn Cal Date: 01/26/2021   PT End of Session - 01/26/21 1715    Visit Number 1    Date for PT Re-Evaluation 04/20/21    PT Start Time 1615    PT Stop Time 1652    PT Time Calculation (min) 37 min    Activity Tolerance Patient tolerated treatment well    Behavior During Therapy [facility] for tasks assessed/performed           Past Medical History:  Diagnosis Date  . Headache(784.0)   . History of chickenpox     History reviewed. No pertinent surgical history.  There were no vitals filed for this visit.    Subjective Assessment - 01/26/21 1614    Subjective Pt presented to ED 12/25/2020 with acute onset of pain in L foot along with swelling. Xray showed no acute bony abnormalities. Pt f/u with ortho and had [place] which showed fx of sesamoid bone of L foot. Was in cam boot for short period. Now out of boot and walking with no AD. No restrictions remaining for weightbearing. Pt is still having some pain/swelling at the base of the L first metatarsal. Pt has been doing some great toe mobility ex's from online that she states have been helping. Able to bear weight on LLE now; however has trouble with push off. Pt reports toe throbbing with too much motion/activity.    Limitations Standing;Walking    Diagnostic tests xray and [place] L foot    Patient Stated Goals be able to walk normal again    Currently in Pain? No/denies    Pain Score 0-No pain   5/10 at worst   Pain Location Foot    Pain Orientation Left    Pain Descriptors / Indicators Aching;Throbbing    Pain Type Acute pain    Pain Onset More than a month ago    Pain Frequency Intermittent    Aggravating Factors  prolonged standing/walking,  bending 1st toe    Pain Relieving Factors rest, ice              [facility] PT Assessment - 01/26/21 0001      Assessment   Medical Diagnosis L foot fx of sesamoid bone 1st toe    Referring Provider (PT) Jordan Likes    Onset Date/Surgical Date 12/25/20    Next MD Visit 02/03/21    Prior Therapy none      Precautions   Precautions None      Restrictions   Other Position/Activity Restrictions WBAT LLE      Balance Screen   Has the patient fallen in the past 6 months No    Has the patient had a decrease in activity level because of a fear of falling?  No    Is the patient reluctant to leave their home because of a fear of falling?  No      Home Environment   Additional Comments split level 3 stairs into den; reports she is going sideways down stairs right now      Prior Function   Level of Independence Independent    Vocation Full time employment    Training and development officer; works from home, desk work    Leisure walking dogs  Sensation   Light Touch Appears Intact      Functional Tests   Functional tests Sit to Stand;Single leg stance      Single Leg Stance   Comments very hesistant; WB outside L foot      Sit to Stand   Comments WB through L lateral foot with great toe lifted off ground; cues for equal WB      ROM / Strength   AROM / PROM / Strength AROM;Strength      AROM   Overall AROM Comments great toe extension: L 50 deg, R 20 deg    AROM Assessment Site Ankle    Right/Left Ankle Left    Left Ankle Dorsiflexion -10    Left Ankle Plantar Flexion 35    Left Ankle Inversion 20    Left Ankle Eversion 15      Strength   Overall Strength Comments WFL on MMT BLE; functional weakness and quick to fatigue LLE      Palpation   Palpation comment palpable swelling and hypomobility of L great toe      Ambulation/Gait   Gait Comments limited stance time LLE, no toe off L foot                      Objective measurements completed on examination:  See above findings.               PT Education - 01/26/21 1714    Education Details Pt educated on POC and HEP: [id number]    Person(s) Educated Patient    Methods Explanation;Demonstration;Handout    Comprehension Verbalized understanding;Returned demonstration            PT Short Term Goals - 01/26/21 1752      PT SHORT TERM GOAL #1   Title Pt will be I with initial HEP    Time 2    Period Weeks    Status New    Target Date 02/09/21             PT Long Term Goals - 01/26/21 1753      PT LONG TERM GOAL #1   Title Pt will demo L great toe extension >/=45 deg    Baseline 20 deg    Time 6    Period Weeks    Status New    Target Date 03/09/21      PT LONG TERM GOAL #2   Title Pt will demo L ankle DF to 10 deg    Baseline -10    Time 6    Period Weeks    Status New    Target Date 03/09/21      PT LONG TERM GOAL #3   Title Pt will demo stairs step over step no HR    Baseline goes sideways with step to pattern    Time 6    Period Weeks    Status New    Target Date 03/09/21      PT LONG TERM GOAL #4   Title Pt will demo gait WFL, equal WB BLE, and toe off LLE    Time 6    Period Weeks    Status New    Target Date 03/09/21      PT LONG TERM GOAL #5   Title Pt will report able to walk dogs >/=30 min with no increase in L toe pain    Time 6    Period Weeks    Status New  Target Date 03/09/21                  Plan - 01/26/21 1744    Clinical Impression Statement Pt presents to clinic s/p L sesamoid bone fracture of great toe. Pt is now WBAT LLE with no movement restrictions per patient. Demos significant deficits in L great toe extension as compared to R. Pt also demos hypomobility of L foot, L ankle ROM deficits, and palpable swelling/tenderness of L great toe. With STS, pt requires tactile/verbal cues to bear weight evenly on L foot. Keeps weight on lateral L foot with great toe off floor. With ambulation, pt demos decreased stance time  on LLE and is lacking toe off with gait. Pt also reports having to take stairs sideways d/t ROM limitations. Pt has cruise planned at beginning of June and would like to be able to walk around cruise ship without pain. Would benefit from skilled PT to normalize gait pattern, increase LLE strength,  increase L ankle ROM, and increase L great toe extension.    Examination-Activity Limitations Stairs;Locomotion Level    Examination-Participation Restrictions Community Activity;Interpersonal Relationship    Stability/Clinical Decision Making Stable/Uncomplicated    Clinical Decision Making Low    Rehab Potential Good    PT Frequency 2x / week    PT Duration 6 weeks    PT Next Visit Plan work on normalizing gait pattern, bearing weight evenly through L foot, increase LLE strength,  increase L ankle ROM, and increase L great toe extension.    PT Home Exercise Plan see pt instructions (pt also has great toe mobility ex's from MD)    Consulted and Agree with Plan of Care Patient           Patient will benefit from skilled therapeutic intervention in order to improve the following deficits and impairments:  Abnormal gait,Decreased range of motion,Difficulty walking,Decreased activity tolerance,Pain,Hypomobility,Improper body mechanics,Decreased strength,Increased edema  Visit Diagnosis: Pain in left toe(s)  Localized edema  Difficulty in walking, not elsewhere classified  Stiffness of left foot, not elsewhere classified     Problem List Patient Active Problem List   Diagnosis Date Noted  . Closed fracture of sesamoid bone of left foot 12/28/2020  . Patellofemoral pain syndrome of left knee 12/28/2020  . Other fatigue 09/28/2014  . Visit for preventive health examination 08/30/2011  . Flu vaccine need 08/30/2011  . Procreative management counseling 08/30/2011  . NECK PAIN 07/06/2010  . HEADACHE 07/06/2010  . 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 COMMON MIGRAINE 06/26/2008  .  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 IRREGULAR MENSTRUAL CYCLE 01/14/2008   Lysle Rubens, PT, DPT Maryanna Shape Kathy Steele 01/26/2021, 5:55 PM  [facility] [address] [place], [place], [address] Phone: [phone]   Fax:  [phone]  Name: Kathy Steele MRN: [id number] Date of Birth: 1975-10-28

## 2021-01-26 NOTE — Patient Instructions (Signed)
Access Code: [email or web address] URL: [email or web address] Date: 01/26/2021 Prepared by: Lysle Rubens  Exercises Sit to Stand Without Arm Support - 1 x daily - 7 x weekly - 2 sets - 10 reps Seated Heel Toe Raises - 1 x daily - 7 x weekly - 2 sets - 10 reps Gastroc Stretch on Wall - 1 x daily - 7 x weekly - 2 sets - 2 reps - 15-20 sec hold Soleus Stretch on Wall - 1 x daily - 7 x weekly - 2 sets - 2 reps - 15-20 sec hold Seated Self Great Toe Stretch - 1 x daily - 7 x weekly - 2 sets - 10 reps Standing Toe Dorsiflexion Stretch - 1 x daily - 7 x weekly - 2 sets - 10 reps

## 2021-02-03 ENCOUNTER — Encounter: Payer: Self-pay | Admitting: Family Medicine

## 2021-02-03 ENCOUNTER — Ambulatory Visit (INDEPENDENT_AMBULATORY_CARE_PROVIDER_SITE_OTHER): Payer: BC Managed Care – PPO | Admitting: Family Medicine

## 2021-02-03 ENCOUNTER — Other Ambulatory Visit: Payer: Self-pay

## 2021-02-03 DIAGNOSIS — S92812D Other fracture of left foot, subsequent encounter for fracture with routine healing: Secondary | ICD-10-CM

## 2021-02-03 NOTE — Assessment & Plan Note (Signed)
Has significant improvement.  She is able to walk and has good range of motion with little to no pain. -Counseled on home exercise therapy and supportive care. -Can continue physical therapy. -Can follow-up as needed.

## 2021-02-03 NOTE — Progress Notes (Signed)
  Kathy Steele - 45 y.o. female MRN [id number]  Date of birth: October 06, 1975  SUBJECTIVE:  Including CC & ROS.  No chief complaint on file.   Kathy Steele is a 45 y.o. female that is following up for her left great toe pain.  Has done well with physical therapy and has little to no pain.   Review of Systems See HPI   HISTORY: Past Medical, Surgical, Social, and Family History Reviewed & Updated per EMR.   Pertinent Historical Findings include:  Past Medical History:  Diagnosis Date  . Headache(784.0)   . History of chickenpox     History reviewed. No pertinent surgical history.  Family History  Problem Relation Age of Onset  . Hypertension Mother   . Diabetes Paternal Uncle   . Diabetes Maternal Grandmother   . Stroke Maternal Grandmother   . Stroke Maternal Grandfather   . Arthritis Other   . Osteoarthritis Other     Social History   Socioeconomic History  . Marital status: Married    Spouse name: Not on file  . Number of children: Not on file  . Years of education: Not on file  . Highest education level: Not on file  Occupational History  . Not on file  Tobacco Use  . Smoking status: Former Smoker    Types: Cigarettes    Quit date: 05/09/2013    Years since quitting: 7.7  . Smokeless tobacco: Never Used  . Tobacco comment: Stopped 1 year ago  Vaping Use  . Vaping Use: Never used  Substance and Sexual Activity  . Alcohol use: No    Alcohol/week: 0.0 standard drinks    Comment: occasional  . Drug use: No  . Sexual activity: Not on file  Other Topics Concern  . 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[employer]    Regular exercise- no   Sleep reg  Hours   Working hr job in [employer]                Social Determinants of Corporate investment banker Strain: Not on Pensions consultant Insecurity: Not on file  Transportation Needs: Not on file  Physical Activity: Not on file  Stress: Not on file  Social Connections: Not on file  Intimate Partner Violence: Not on file     PHYSICAL EXAM:  VS: BP 112/72 (BP Location: Left Arm, Patient Position: Sitting, Cuff Size: Normal)   Ht 5\' 3"  (1.6 m)   Wt 148 lb (67.1 kg)   BMI 26.22 kg/m  Physical Exam Gen: NAD, alert, cooperative with exam, well-appearing MSK:  Left great toe: Has good range of motion passively. No redness or swelling. Normal ambulation. Neurovascular intact     ASSESSMENT & PLAN:   Closed fracture of sesamoid bone of left foot Has significant improvement.  She is able to walk and has good range of motion with little to no pain. -Counseled on home exercise therapy and supportive care. -Can continue physical therapy. -Can follow-up as needed.

## 2021-02-08 ENCOUNTER — Other Ambulatory Visit: Payer: Self-pay

## 2021-02-08 ENCOUNTER — Ambulatory Visit: Payer: BC Managed Care – PPO | Admitting: Physical Therapy

## 2021-02-08 DIAGNOSIS — M25675 Stiffness of left foot, not elsewhere classified: Secondary | ICD-10-CM | POA: Diagnosis not present

## 2021-02-08 DIAGNOSIS — R262 Difficulty in walking, not elsewhere classified: Secondary | ICD-10-CM | POA: Diagnosis not present

## 2021-02-08 DIAGNOSIS — M79675 Pain in left toe(s): Secondary | ICD-10-CM | POA: Diagnosis not present

## 2021-02-08 DIAGNOSIS — R600 Localized edema: Secondary | ICD-10-CM | POA: Diagnosis not present

## 2021-02-08 NOTE — Therapy (Signed)
[place] [place], [place], [address] Phone: [phone]   Fax:  [phone]  Physical Therapy Treatment  Patient Details  Name: Kathy Steele MRN: [id number] Date of Birth: 1975/10/14 Referring Provider (PT): Clinton Quant Date: 02/08/2021   PT End of Session - 02/08/21 1655    Visit Number 2    Date for PT Re-Evaluation 04/20/21    PT Start Time 6945    PT Stop Time 1655    PT Time Calculation (min) 40 min           Past Medical History:  Diagnosis Date  . Headache(784.0)   . History of chickenpox     No past surgical history on file.  There were no vitals filed for this visit.   Subjective Assessment - 02/08/21 1615    Subjective feeling better, just throbs a little bit after ex    Currently in Pain? No/denies              [facility] PT Assessment - 02/08/21 0001      AROM   Overall AROM Comments great toe extension: L 31 deg, R 50 deg    Left Ankle Dorsiflexion 20    Left Ankle Plantar Flexion 42    Left Ankle Inversion 25    Left Ankle Eversion 25                         [facility] Adult PT Treatment/Exercise - 02/08/21 0001      Exercises   Exercises Knee/Hip;Ankle      Knee/Hip Exercises: Aerobic   Nustep L 5 25mn      Knee/Hip Exercises: Standing   Other Standing Knee Exercises sit fit 4 way ankle ROM 15 x each      Ankle Exercises: Standing   Heel Raises Both;15 reps   black bar   Toe Raise 15 reps   black bar     Ankle Exercises: Seated   Other Seated Ankle Exercises red tband ankle 4 way 15 each                  PT Education - 02/08/21 1653    Education Details [id number]    Person(s) Educated Patient    Methods Explanation;Demonstration;Handout    Comprehension Verbalized understanding;Returned demonstration            PT Short Term Goals - 02/08/21 1659      PT SHORT TERM GOAL #1   Title Pt will be I with initial HEP    Status  Achieved             PT Long Term Goals - 01/26/21 1753      PT LONG TERM GOAL #1   Title Pt will demo L great toe extension >/=45 deg    Baseline 20 deg    Time 6    Period Weeks    Status New    Target Date 03/09/21      PT LONG TERM GOAL #2   Title Pt will demo L ankle DF to 10 deg    Baseline -10    Time 6    Period Weeks    Status New    Target Date 03/09/21      PT LONG TERM GOAL #3   Title Pt will demo stairs step over step no HR    Baseline goes sideways with step to pattern  Time 6    Period Weeks    Status New    Target Date 03/09/21      PT LONG TERM GOAL #4   Title Pt will demo gait WFL, equal WB BLE, and toe off LLE    Time 6    Period Weeks    Status New    Target Date 03/09/21      PT LONG TERM GOAL #5   Title Pt will report able to walk dogs >/=30 min with no increase in L toe pain    Time 6    Period Weeks    Status New    Target Date 03/09/21                 Plan - 02/08/21 1655    Clinical Impression Statement pt reports much better pain and doing HEP. Pt with significant increase in ROM. Progressed ex and HEP. Some mild discomfort with ther ex and fatigue noted. STG met    PT Next Visit Plan assess and progress. pt with crocs on today asked to wear tennis shoes next session. much improved gait noted even in crocs           Patient will benefit from skilled therapeutic intervention in order to improve the following deficits and impairments:  Abnormal gait,Decreased range of motion,Difficulty walking,Decreased activity tolerance,Pain,Hypomobility,Improper body mechanics,Decreased strength,Increased edema  Visit Diagnosis: Pain in left toe(s)  Stiffness of left foot, not elsewhere classified     Problem List Patient Active Problem List   Diagnosis Date Noted  . Closed fracture of sesamoid bone of left foot 12/28/2020  . Patellofemoral pain syndrome of left knee 12/28/2020  . Other fatigue 09/28/2014  . Visit for  preventive health examination 08/30/2011  . Flu vaccine need 08/30/2011  . Procreative management counseling 08/30/2011  . NECK PAIN 07/06/2010  . HEADACHE 07/06/2010  . 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 COMMON MIGRAINE 06/26/2008  . 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 IRREGULAR MENSTRUAL CYCLE 01/14/2008    PAYSEUR,ANGIE PTA 02/08/2021, 5:01 PM  [place] [place], [place], [address] Phone: [phone]   Fax:  [phone]  Name: Kathy Steele MRN: [id number] Date of Birth: October 26, 1975

## 2021-02-14 ENCOUNTER — Ambulatory Visit: Payer: BC Managed Care – PPO | Admitting: Physical Therapy

## 2021-02-17 ENCOUNTER — Ambulatory Visit: Payer: BC Managed Care – PPO | Admitting: Physical Therapy

## 2021-02-17 ENCOUNTER — Other Ambulatory Visit: Payer: Self-pay

## 2021-02-17 DIAGNOSIS — M25675 Stiffness of left foot, not elsewhere classified: Secondary | ICD-10-CM

## 2021-02-17 DIAGNOSIS — R262 Difficulty in walking, not elsewhere classified: Secondary | ICD-10-CM

## 2021-02-17 DIAGNOSIS — R600 Localized edema: Secondary | ICD-10-CM | POA: Diagnosis not present

## 2021-02-17 DIAGNOSIS — M79675 Pain in left toe(s): Secondary | ICD-10-CM

## 2021-02-17 NOTE — Therapy (Signed)
[facility] [address] [place], [place], [address] Phone: [phone]   Fax:  [phone]  Physical Therapy Treatment  Patient Details  Name: Kathy Steele MRN: [id number] Date of Birth: 08-06-1976 Referring Provider (PT): Lenn Cal Date: 02/17/2021   PT End of Session - 02/17/21 1741    Visit Number 3    Date for PT Re-Evaluation 04/20/21    PT Start Time 1615    PT Stop Time 1645    PT Time Calculation (min) 30 min           Past Medical History:  Diagnosis Date  . Headache(784.0)   . History of chickenpox     No past surgical history on file.  There were no vitals filed for this visit.   Subjective Assessment - 02/17/21 1619    Subjective doing well, alittle sore. main thing is I can not wear heels    Currently in Pain? No/denies              [facility] PT Assessment - 02/17/21 0001      AROM   Overall AROM Comments great toe extension: L 48 deg, R 50 deg    Left Ankle Dorsiflexion 23                         [facility] Adult PT Treatment/Exercise - 02/17/21 0001      Ambulation/Gait   Gait Comments light jogging      Knee/Hip Exercises: Aerobic   Tread Mill SL push /pull   LLE 20 x each   Recumbent Bike L 4 5 min      Knee/Hip Exercises: Plyometrics   Bilateral Jumping 10 reps;Box Height: 4"    Other Plyometric Exercises various trampoline jumps    Other Plyometric Exercises SLS activities on foam mat      Knee/Hip Exercises: Standing   Heel Raises 20 reps;Both   black bar heel and toe raises     Manual Therapy   Manual Therapy Joint mobilization    Joint Mobilization great toe      Ankle Exercises: Standing   Other Standing Ankle Exercises sit fit SL                    PT Short Term Goals - 02/08/21 1659      PT SHORT TERM GOAL #1   Title Pt will be I with initial HEP    Status Achieved             PT Long Term Goals - 01/26/21 1753      PT  LONG TERM GOAL #1   Title Pt will demo L great toe extension >/=45 deg    Baseline 20 deg    Time 6    Period Weeks    Status New    Target Date 03/09/21      PT LONG TERM GOAL #2   Title Pt will demo L ankle DF to 10 deg    Baseline -10    Time 6    Period Weeks    Status New    Target Date 03/09/21      PT LONG TERM GOAL #3   Title Pt will demo stairs step over step no HR    Baseline goes sideways with step to pattern    Time 6    Period Weeks    Status New    Target  Date 03/09/21      PT LONG TERM GOAL #4   Title Pt will demo gait WFL, equal WB BLE, and toe off LLE    Time 6    Period Weeks    Status New    Target Date 03/09/21      PT LONG TERM GOAL #5   Title Pt will report able to walk dogs >/=30 min with no increase in L toe pain    Time 6    Period Weeks    Status New    Target Date 03/09/21                 Plan - 02/17/21 1741    Clinical Impression Statement pt making excellent progress. only pain with ext of great toe.some hesitancy with mvmt and LE fatigue. progressing withgoals and ROM is very good. added dynamic ex on pillow at home.    PT Next Visit Plan assess and progress           Patient will benefit from skilled therapeutic intervention in order to improve the following deficits and impairments:  Abnormal gait,Decreased range of motion,Difficulty walking,Decreased activity tolerance,Pain,Hypomobility,Improper body mechanics,Decreased strength,Increased edema  Visit Diagnosis: Pain in left toe(s)  Stiffness of left foot, not elsewhere classified  Difficulty in walking, not elsewhere classified     Problem List Patient Active Problem List   Diagnosis Date Noted  . Closed fracture of sesamoid bone of left foot 12/28/2020  . Patellofemoral pain syndrome of left knee 12/28/2020  . Other fatigue 09/28/2014  . Visit for preventive health examination 08/30/2011  . Flu vaccine need 08/30/2011  . Procreative management counseling  08/30/2011  . NECK PAIN 07/06/2010  . HEADACHE 07/06/2010  . 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 COMMON MIGRAINE 06/26/2008  . 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 IRREGULAR MENSTRUAL CYCLE 01/14/2008    Lasharn Bufkin,ANGIE PTA 02/17/2021, 5:43 PM  [facility] [address] [place], [place], [address] Phone: [phone]   Fax:  [phone]  Name: Kathy Steele MRN: [id number] Date of Birth: [birth year]

## 2021-02-22 ENCOUNTER — Ambulatory Visit: Payer: BC Managed Care – PPO | Admitting: Physical Therapy

## 2021-03-07 DIAGNOSIS — R928 Other abnormal and inconclusive findings on diagnostic imaging of breast: Secondary | ICD-10-CM | POA: Diagnosis not present

## 2021-04-19 DIAGNOSIS — R631 Polydipsia: Secondary | ICD-10-CM | POA: Diagnosis not present

## 2021-04-19 DIAGNOSIS — N3281 Overactive bladder: Secondary | ICD-10-CM | POA: Diagnosis not present

## 2021-04-19 DIAGNOSIS — Z8349 Family history of other endocrine, nutritional and metabolic diseases: Secondary | ICD-10-CM | POA: Diagnosis not present

## 2021-04-19 DIAGNOSIS — R3589 Other polyuria: Secondary | ICD-10-CM | POA: Diagnosis not present

## 2021-05-09 DIAGNOSIS — R3589 Other polyuria: Secondary | ICD-10-CM | POA: Diagnosis not present

## 2021-05-09 DIAGNOSIS — R631 Polydipsia: Secondary | ICD-10-CM | POA: Diagnosis not present

## 2021-05-09 DIAGNOSIS — Z8349 Family history of other endocrine, nutritional and metabolic diseases: Secondary | ICD-10-CM | POA: Diagnosis not present

## 2021-05-11 ENCOUNTER — Other Ambulatory Visit: Payer: Self-pay | Admitting: Obstetrics & Gynecology

## 2021-05-11 DIAGNOSIS — Z09 Encounter for follow-up examination after completed treatment for conditions other than malignant neoplasm: Secondary | ICD-10-CM

## 2021-06-03 DIAGNOSIS — B349 Viral infection, unspecified: Secondary | ICD-10-CM | POA: Diagnosis not present

## 2021-08-12 ENCOUNTER — Other Ambulatory Visit: Payer: Self-pay

## 2021-08-12 NOTE — Progress Notes (Signed)
HPI: Kathy Steele is a 45 y.o. female, who is here today for her routine physical.  Last CPE: 08/11/20  Regular exercise 3 or more time per week: She walks her dog daily 3 miles. Following a healthy diet: "It could be better She lives with her husband and son.    Chronic medical problems: Migraine ,HLD,dysmenorrhea,and anxiety. She follows with dermatologist regularly.   Pap smear:02/01/19. She sees gyn regularly, Dr Garwin Brothers  Immunization History  Administered Date(s) Administered   Influenza Split 08/31/2011, 09/16/2012, 07/25/2013   Influenza Whole 07/06/2010   Influenza,inj,Quad PF,6+ Mos 07/30/2018, 08/04/2019, 08/11/2020, 08/15/2021   PFIZER(Purple Top)SARS-COV-2 Vaccination 12/31/2019, 02/18/2020   Td 07/06/2010   Tdap 08/11/2020   Health Maintenance  Topic Date Due   COVID-19 Vaccine (3 - Pfizer risk series) 08/31/2021 (Originally 03/17/2020)   Pneumococcal Vaccine 67-35 Years old (1 - PCV) 08/15/2022 (Originally 08/12/1984)   HIV Screening  08/15/2029 (Originally 08/12/1993)   PAP SMEAR-Modifier  09/03/2022   TETANUS/TDAP  08/11/2030   INFLUENZA VACCINE  Completed   Hepatitis C Screening  Completed   HPV VACCINES  Aged Out   HLD: She is on non pharmacologic treatment.  Lab Results  Component Value Date   CHOL 221 (H) 08/11/2020   HDL 64.80 08/11/2020   LDLCALC 143 (H) 08/11/2020   LDLDIRECT 131.6 09/09/2013   TRIG 62.0 08/11/2020   CHOLHDL 3 08/11/2020   She has seen urologist and endocrinologist for urinary frequency. Ca++ mildly low in 24 hours urine.She would like urine Ca++ repeated today. Urinary frequency worse around the beginning of menstrual periods, negative for nocturia and dysuria. According to pt, she is to follow with endocrinologist prn.  U Interval  24-hour   U Volume ML 800   U Calcium Concentrate MG/DL 10.6   U Calcium/24HR 100 - 300 MG/24HR 85 Low    U Creatinine Concentrate MG/DL 181   Urine Calcium/G Creatinine 50 - 400  MG/G CREAT 59   U Creatinine/24 HR 1000 - 1800 MG/24 HR 1,448    Review of Systems  Constitutional:  Negative for appetite change, fatigue and fever.  HENT:  Negative for hearing loss, mouth sores, sore throat and trouble swallowing.   Eyes:  Negative for redness and visual disturbance.  Respiratory:  Negative for cough, shortness of breath and wheezing.   Cardiovascular:  Negative for chest pain and leg swelling.  Gastrointestinal:  Negative for abdominal pain, nausea and vomiting.       No changes in bowel habits.  Endocrine: Negative for cold intolerance, heat intolerance, polydipsia, polyphagia and polyuria.  Genitourinary:  Negative for decreased urine volume, difficulty urinating, hematuria, vaginal bleeding and vaginal discharge.  Musculoskeletal:  Negative for gait problem and myalgias.  Skin:  Negative for color change and rash.  Allergic/Immunologic: Positive for environmental allergies.  Neurological:  Negative for syncope, weakness and headaches.  Hematological:  Negative for adenopathy. Does not bruise/bleed easily.  Psychiatric/Behavioral:  Negative for confusion.   All other systems reviewed and are negative.  Current Outpatient Medications on File Prior to Visit  Medication Sig Dispense Refill   Aspirin-Acetaminophen-Caffeine (GOODYS EXTRA STRENGTH PO) Take 1 packet by mouth daily as needed (headache).      BLACK CURRANT SEED OIL PO Take by mouth.     cetirizine (ZYRTEC) 10 MG tablet Take 10 mg by mouth daily.     ELDERBERRY PO Take by mouth.     fluticasone (FLONASE) 50 MCG/ACT nasal spray Place 2 sprays into both nostrils daily.  16 g 0   Multiple Vitamin (MULTIVITAMIN WITH MINERALS) TABS tablet Take 1 tablet by mouth daily.     naproxen sodium (ALEVE) 220 MG tablet Take 220 mg by mouth as needed.     No current facility-administered medications on file prior to visit.   Past Medical History:  Diagnosis Date   Headache(784.0)    History of chickenpox    History  reviewed. No pertinent surgical history.  Allergies  Allergen Reactions   Doxycycline Other (See Comments)   Escitalopram Oxalate     REACTION: Nausea, Ha and felt spacy    Family History  Problem Relation Age of Onset   Hypertension Mother    Diabetes Paternal Uncle    Diabetes Maternal Grandmother    Stroke Maternal Grandmother    Stroke Maternal Grandfather    Arthritis Other    Osteoarthritis Other    Social History   Socioeconomic History   Marital status: Married    Spouse name: Not on file   Number of children: Not on file   Years of education: Not on file   Highest education level: Not on file  Occupational History   Not on file  Tobacco Use   Smoking status: Former    Types: Cigarettes    Quit date: 05/09/2013    Years since quitting: 8.2   Smokeless tobacco: Never   Tobacco comments:    Stopped 1 year ago  Vaping Use   Vaping Use: Never used  Substance and Sexual Activity   Alcohol use: No    Alcohol/week: 0.0 standard drinks    Comment: occasional   Drug use: No   Sexual activity: Not on file  Other Topics Concern   Not on file  Social History Narrative   HH of 3 husband and son no tobacco    Mom smokes when visits   Sleep usually ok   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  Wt Readings from Last 3 Encounters:  08/15/21 155 lb 9.6 oz (70.6 kg)  02/03/21 148 lb (67.1 kg)  01/17/21 148 lb (67.1 kg)   Physical Exam Vitals and nursing note  reviewed.  Constitutional:      General: She is not in acute distress.    Appearance: She is well-developed.  HENT:     Head: Normocephalic and atraumatic.     Right Ear: Tympanic membrane, ear canal and external ear normal.     Left Ear: Tympanic membrane, ear canal and external ear normal.     Mouth/Throat:     Mouth: Mucous membranes are moist.     Pharynx: Oropharynx is clear. Uvula midline.  Eyes:     Extraocular Movements: Extraocular movements intact.     Conjunctiva/sclera: Conjunctivae normal.     Pupils: Pupils are equal,  round, and reactive to light.  Neck:     Thyroid: No thyromegaly.     Trachea: No tracheal deviation.  Cardiovascular:     Rate and Rhythm: Normal rate and regular rhythm.     Pulses:          Dorsalis pedis pulses are 2+ on the right side and 2+ on the left side.     Heart sounds: No murmur heard. Pulmonary:     Effort: Pulmonary effort is normal. No respiratory distress.     Breath sounds: Normal breath sounds.  Abdominal:     Palpations: Abdomen is soft. There is no hepatomegaly or mass.     Tenderness: There is no abdominal tenderness.  Genitourinary:    Comments: Deferred to gyn. Musculoskeletal:     Comments: No major deformity or signs of synovitis appreciated.  Lymphadenopathy:     Cervical: No cervical adenopathy.     Upper Body:     Right upper body: No supraclavicular adenopathy.     Left upper body: No supraclavicular adenopathy.  Skin:    General: Skin is warm.     Findings: No erythema or rash.  Neurological:     General: No focal deficit present.     Mental Status: She is alert and oriented to person, place, and time.     Cranial Nerves: No cranial nerve deficit.     Coordination: Coordination normal.     Gait: Gait normal.     Deep Tendon Reflexes:     Reflex Scores:      Bicep reflexes are 2+ on the right side and 2+ on the left side.      Patellar reflexes are 2+ on the right side and 2+ on the left side. Psychiatric:         Speech: Speech normal.     Comments: Well groomed, good eye contact.   ASSESSMENT AND PLAN:  Ms. VARINA HULON was here today annual physical examination.  Orders Placed This Encounter  Procedures   Flu Vaccine QUAD 57mo+IM (Fluarix, Fluzone & Alfiuria Quad PF)   Calcium, urine, random   Basic metabolic panel   Hemoglobin A1c   Lipid panel   Lab Results  Component Value Date   HGBA1C 5.2 08/15/2021   Lab Results  Component Value Date   CREATININE 0.68 08/15/2021   BUN 12 08/15/2021   NA 138 08/15/2021   K 3.8 08/15/2021   CL 104 08/15/2021   CO2 27 08/15/2021   Lab Results  Component Value Date   CHOL 232 (H) 08/15/2021   HDL 74.80 08/15/2021   LDLCALC 145 (H) 08/15/2021   LDLDIRECT 131.6 09/09/2013   TRIG 62.0 08/15/2021   CHOLHDL 3 08/15/2021   Routine general medical examination at a health care facility We discussed the importance of regular physical activity and healthy diet for prevention of chronic illness and/or complications. Preventive guidelines reviewed. Vaccination up to date. Next CPE in a year.  The 10-year ASCVD risk score (Arnett DK, et al., 2019) is: 0.3%   Values used to calculate the score:     Age: 51 years     Sex: Female     Is Non-Hispanic African American: Yes     Diabetic: No     Tobacco smoker: No     Systolic Blood Pressure: 114 mmHg     Is BP treated: No     HDL Cholesterol: 74.8 mg/dL     Total Cholesterol: 232 mg/dL  Screening for  endocrine, metabolic and immunity disorder -     Hemoglobin A1c -     Basic metabolic panel  Frequency of urination and polyuria Stable. Work-up has been negative otherwise. Random urine Ca++ ordered today.  Need for influenza vaccination -     Flu Vaccine QUAD 32mo+IM (Fluarix, Fluzone & Alfiuria Quad PF)  Hyperlipidemia Non pharmacologic treatment recommended for now. Further recommendations will be given according to 10 years CVD risk score and lipid panel numbers.  Return in 1  year (on 08/15/2022) for CPE.  Juneau Doughman G. Martinique, MD  [facility]. [place] office.

## 2021-08-15 ENCOUNTER — Encounter: Payer: Self-pay | Admitting: Family Medicine

## 2021-08-15 ENCOUNTER — Ambulatory Visit (INDEPENDENT_AMBULATORY_CARE_PROVIDER_SITE_OTHER): Payer: BC Managed Care – PPO | Admitting: Family Medicine

## 2021-08-15 VITALS — BP 114/72 | HR 73 | Temp 98.0°F | Resp 16 | Ht 63.0 in | Wt 155.6 lb

## 2021-08-15 DIAGNOSIS — R350 Frequency of micturition: Secondary | ICD-10-CM

## 2021-08-15 DIAGNOSIS — E785 Hyperlipidemia, unspecified: Secondary | ICD-10-CM | POA: Diagnosis not present

## 2021-08-15 DIAGNOSIS — Z0000 Encounter for general adult medical examination without abnormal findings: Secondary | ICD-10-CM

## 2021-08-15 DIAGNOSIS — Z13228 Encounter for screening for other metabolic disorders: Secondary | ICD-10-CM | POA: Diagnosis not present

## 2021-08-15 DIAGNOSIS — R3589 Other polyuria: Secondary | ICD-10-CM | POA: Diagnosis not present

## 2021-08-15 DIAGNOSIS — Z23 Encounter for immunization: Secondary | ICD-10-CM | POA: Diagnosis not present

## 2021-08-15 DIAGNOSIS — Z1329 Encounter for screening for other suspected endocrine disorder: Secondary | ICD-10-CM

## 2021-08-15 DIAGNOSIS — Z130 Encounter for screening for diseases of the blood and blood-forming organs and certain disorders involving the immune mechanism: Secondary | ICD-10-CM | POA: Diagnosis not present

## 2021-08-15 LAB — LIPID PANEL
Cholesterol: 232 mg/dL — ABNORMAL HIGH (ref 0–200)
HDL: 74.8 mg/dL (ref >39.00)
LDL Cholesterol: 145 mg/dL — ABNORMAL HIGH (ref 0–99)
NonHDL: 157.23
Total CHOL/HDL Ratio: 3
Triglycerides: 62 mg/dL (ref 0.0–149.0)
VLDL: 12.4 mg/dL (ref 0.0–40.0)

## 2021-08-15 LAB — HEMOGLOBIN A1C: Hgb A1c MFr Bld: 5.2 % (ref 4.6–6.5)

## 2021-08-15 LAB — BASIC METABOLIC PANEL
BUN: 12 mg/dL (ref 6–23)
CO2: 27 mEq/L (ref 19–32)
Calcium: 9.2 mg/dL (ref 8.4–10.5)
Chloride: 104 mEq/L (ref 96–112)
Creatinine, Ser: 0.68 mg/dL (ref 0.40–1.20)
GFR: 106.98 mL/min (ref >60.00)
Glucose, Bld: 94 mg/dL (ref 70–99)
Potassium: 3.8 mEq/L (ref 3.5–5.1)
Sodium: 138 mEq/L (ref 135–145)

## 2021-08-15 NOTE — Assessment & Plan Note (Signed)
Non pharmacologic treatment recommended for now. Further recommendations will be given according to 10 years CVD risk score and lipid panel numbers. 

## 2021-08-15 NOTE — Patient Instructions (Addendum)
A few things to remember from today's visit:  Routine general medical examination at a health care facility  Hyperlipidemia, unspecified hyperlipidemia type - Plan: Lipid panel  Screening for endocrine, metabolic and immunity disorder - Plan: Basic metabolic panel, Hemoglobin A1c  Frequency of urination and polyuria - Plan: Calcium, urine, random  If you need refills please call your pharmacy. Do not use My Chart to request refills or for acute issues that need immediate attention.   Please be sure medication list is accurate. If a new problem present, please set up appointment sooner than planned today.  At least 150 minutes of moderate exercise per week, daily brisk walking for 15-30 min is a good exercise option. Healthy diet low in saturated (animal) fats and sweets and consisting of fresh fruits and vegetables, lean meats such as fish and white chicken and whole grains.  These are some of recommendations for screening depending of age and risk factors:  - Vaccines:  Tdap vaccine every 10 years.  Shingles vaccine recommended at age 33, could be given after 43 years of age but not sure about insurance coverage.   Pneumonia vaccines: Pneumovax at 65. Sometimes Pneumovax is giving earlier if history of smoking, lung disease,diabetes,kidney disease among some.  Screening for diabetes at age 16 and every 3 years.  Cervical cancer prevention:  Pap smear starts at 43 years of age and continues periodically until 43 years old in low risk women. Pap smear every 3 years between 9 and 30 years old. Pap smear every 3-5 years between women 30 and older if pap smear negative and HPV screening negative.  -Breast cancer: Mammogram: There is disagreement between experts about when to start screening in low risk asymptomatic female but recent recommendations are to start screening at 74 and not later than 43 years old , every 1-2 years and after 43 yo q 2 years. Screening is recommended until  43 years old but some women can continue screening depending of healthy issues.  Colon cancer screening: Has been recently changed to 43 yo. Insurance may not cover until you are 43 years old. Screening is recommended until 43 years old.  Cholesterol disorder screening at age 86 and every 3 years.  Also recommended:  Dental visit- Brush and floss your teeth twice daily; visit your dentist twice a year. Eye doctor- Get an eye exam at least every 2 years. Helmet use- Always wear a helmet when riding a bicycle, motorcycle, rollerblading or skateboarding. Safe sex- If you may be exposed to sexually transmitted infections, use a condom. Seat belts- Seat belts can save your live; always wear one. Smoke/Carbon Monoxide detectors- These detectors need to be installed on the appropriate level of your home. Replace batteries at least once a year. Skin cancer- When out in the sun please cover up and use sunscreen 15 SPF or higher. Violence- If anyone is threatening or hurting you, please tell your healthcare provider.  Drink alcohol in moderation- Limit alcohol intake to one drink or less per day. Never drink and drive. Calcium supplementation 1000 to 1200 mg daily, ideally through your diet.  Vitamin D supplementation 800 units daily.

## 2021-08-16 LAB — CALCIUM, URINE, RANDOM: CALCIUM, RANDOM URINE: 10.2 mg/dL

## 2021-09-08 ENCOUNTER — Inpatient Hospital Stay: Admission: RE | Admit: 2021-09-08 | Payer: BC Managed Care – PPO | Source: Ambulatory Visit

## 2021-10-13 ENCOUNTER — Ambulatory Visit
Admission: RE | Admit: 2021-10-13 | Discharge: 2021-10-13 | Disposition: A | Discharge status: Home / Self Care | Payer: BC Managed Care – PPO | Source: Ambulatory Visit | Attending: Obstetrics & Gynecology | Admitting: Obstetrics & Gynecology

## 2021-10-13 ENCOUNTER — Other Ambulatory Visit: Payer: Self-pay | Admitting: Obstetrics & Gynecology

## 2021-10-13 DIAGNOSIS — Z09 Encounter for follow-up examination after completed treatment for conditions other than malignant neoplasm: Secondary | ICD-10-CM

## 2021-10-13 DIAGNOSIS — R922 Inconclusive mammogram: Secondary | ICD-10-CM | POA: Diagnosis not present

## 2021-11-11 DIAGNOSIS — Z113 Encounter for screening for infections with a predominantly sexual mode of transmission: Secondary | ICD-10-CM | POA: Diagnosis not present

## 2021-12-14 NOTE — Progress Notes (Addendum)
? ?HPI: ?[name] is a 46 y.o. female, who is here today to discuss labs she needs for a procedure, surgery clearance requested by Dr Jennette Bill at [facility] plastic surgery. ?Planning on having "liposuction 360" with fat transfer to buttocks on 01/10/2022 ? ?She has a list of labs she needs done. ?No prior surgeries. ?She denies any CP,SOB,palpitation,diaphoresis,nausea,or dizziness upon climbing a flight of stairs,carrying heavy groceries,walking a hill,or exercising with moderate intensity. ? ?She was  recommended to start taking Fe sulfate 325 mg tid, vit C,and folic acid. She has not tolerated iron well and wonder if she can have liquid form. ?No hx of anemia. ? ?Lab Results  ?Component Value Date  ? WBC 4.7 08/04/2019  ? HGB 12.6 08/04/2019  ? HCT 37.3 08/04/2019  ? MCV 94.1 08/04/2019  ? PLT 303.0 08/04/2019  ? ?No hx of CAD,HTN,CHD,DM,or tobacco use. ? ?HLD on non pharmacologic treatment. ?She is trying to eat healthier and exercising regularly. ? ?Lab Results  ?Component Value Date  ? CHOL 232 (H) 08/15/2021  ? HDL 74.80 08/15/2021  ? LDLCALC 145 (H) 08/15/2021  ? LDLDIRECT 131.6 09/09/2013  ? TRIG 62.0 08/15/2021  ? CHOLHDL 3 08/15/2021  ? ?Review of Systems  ?Constitutional:  Negative for activity change, appetite change and fever.  ?HENT:  Negative for mouth sores, nosebleeds and sore throat.   ?Eyes:  Negative for redness and visual disturbance.  ?Respiratory:  Negative for cough and wheezing.   ?Cardiovascular:  Negative for palpitations and leg swelling.  ?Gastrointestinal:  Negative for abdominal pain, nausea and vomiting.  ?     Negative for changes in bowel habits.  ?Endocrine: Negative for cold intolerance, heat intolerance, polydipsia, polyphagia and polyuria.  ?Genitourinary:  Negative for decreased urine volume, dysuria and hematuria.  ?Musculoskeletal:  Negative for gait problem and myalgias.  ?Skin:  Negative for rash.  ?Neurological:  Negative for syncope, weakness and  headaches.  ?Hematological:  Negative for adenopathy. Does not bruise/bleed easily.  ?Rest see pertinent positives and negatives per HPI. ? ?Current Outpatient Medications on File Prior to Visit  ?Medication Sig Dispense Refill  ? Aspirin-Acetaminophen-Caffeine (GOODYS EXTRA STRENGTH PO) Take 1 packet by mouth daily as needed (headache).     ? BLACK CURRANT SEED OIL PO Take by mouth.    ? cetirizine (ZYRTEC) 10 MG tablet Take 10 mg by mouth daily.    ? ELDERBERRY PO Take by mouth.    ? fluticasone (FLONASE) 50 MCG/ACT nasal spray Place 2 sprays into both nostrils daily. 16 g 0  ? Multiple Vitamin (MULTIVITAMIN WITH MINERALS) TABS tablet Take 1 tablet by mouth daily.    ? naproxen sodium (ALEVE) 220 MG tablet Take 220 mg by mouth as needed.    ? ?No current facility-administered medications on file prior to visit.  ? ? ?Past Medical History:  ?Diagnosis Date  ? Headache(784.0)   ? History of chickenpox   ? ?Allergies  ?Allergen Reactions  ? Doxycycline Other (See Comments)  ? Escitalopram Oxalate   ?  REACTION: Nausea, Ha and felt spacy  ? ? ?Social History  ? ?Socioeconomic History  ? Marital status: Married  ?  Spouse name: Not on file  ? Number of children: Not on file  ? Years of education: Not on file  ? Highest education level: Not on file  ?Occupational History  ? Not on file  ?Tobacco Use  ? Smoking status: Former  ?  Types: Cigarettes  ?  Quit date: 05/09/2013  ?  Years since quitting: 8.6  ? Smokeless tobacco: Never  ? Tobacco comments:  ?  Stopped 1 year ago  ?Vaping Use  ? Vaping Use: Never used  ?Substance and Sexual Activity  ? Alcohol use: No  ?  Alcohol/week: 0.0 standard drinks  ?  Comment: occasional  ? Drug use: No  ? Sexual activity: Not on file  ?Other Topics Concern  ? Not on file  ?Social History Narrative  ? HH of 3 husband and son no tobacco   ? Mom smokes when visits  ? Sleep usually ok  ? 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  job Merchandiser, retailsupervisor  [employer]    ? Regular exercise- no  ? Sleep reg  Hours  ? Working hr job in [place]   ?   ?   ?   ?   ? ?Social Determinants of Health  ? ?Financial Resource Strain: Not on file  ?Food Insecurity: Not on file  ?Transportation Needs: Not on file  ?Physical Activity: Not on file  ?Stress: Not on file  ?Social Connections: Not on file  ? ? ?Vitals:  ? 12/16/21 0723  ?BP: 110/70  ?Pulse: 84  ?Resp: 16  ?Temp: 98.5 ?F (36.9 ?C)  ?SpO2: 97%  ? ?Body mass index is 27.63 kg/m?. ? ?Physical Exam ?Vitals and nursing note reviewed.  ?Constitutional:   ?   General: She is not in acute distress. ?   Appearance: She is well-developed.  ?HENT:  ?   Head: Normocephalic and atraumatic.  ?   Mouth/Throat:  ?   Mouth: Mucous membranes are moist.  ?   Pharynx: Oropharynx is clear.  ?Eyes:  ?   Conjunctiva/sclera: Conjunctivae normal.  ?Cardiovascular:  ?   Rate and Rhythm: Normal rate and regular rhythm.  ?   Pulses:     ?     Dorsalis pedis pulses are 2+ on the right side and 2+ on the left side.  ?   Heart sounds: No murmur heard. ?Pulmonary:  ?   Effort: Pulmonary effort is normal. No respiratory distress.  ?   Breath sounds: Normal breath sounds.  ?Abdominal:  ?   Palpations: Abdomen is soft. There is no hepatomegaly or mass.  ?   Tenderness: There is no abdominal tenderness.  ?Lymphadenopathy:  ?   Cervical: No cervical adenopathy.  ?Skin: ?   General: Skin is warm.  ?   Findings: No erythema or rash.  ?Neurological:  ?   General: No focal deficit present.  ?   Mental Status: She is alert and oriented to person, place, and time.  ?   Cranial Nerves: No cranial nerve deficit.  ?   Gait: Gait normal.  ?Psychiatric:  ?   Comments: Well groomed, good eye contact.  ? ?ASSESSMENT AND PLAN: ? ?Ms.Bonnell was seen today for medical clearance. ? ?Diagnoses and all orders for this visit: ?Orders Placed This Encounter  ?Procedures  ? DG Chest 2 View  ? Comprehensive metabolic panel  ? CBC with Differential/Platelet  ? Protime-INR  ? HIV antibody  (with reflex)  ? TSH  ? Hemoglobin A1c  ? HCG, Qualitative  ? EKG 12-Lead  ? ?Lab Results  ?Component Value Date  ? HGBA1C 5.3 12/16/2021  ? ?Lab Results  ?Component Value Date  ? CREATININE 0.62 12/16/2021  ? BUN 12 12/16/2021  ? NA 136 12/16/2021  ? K 3.8 12/16/2021  ? CL 103 12/16/2021  ?  CO2 27 12/16/2021  ? ?Lab Results  ?Component Value Date  ? ALT 14 12/16/2021  ? AST 18 12/16/2021  ? ALKPHOS 52 12/16/2021  ? BILITOT 0.4 12/16/2021  ? ?Lab Results  ?Component Value Date  ? TSH 1.40 12/16/2021  ? ?Lab Results  ?Component Value Date  ? WBC 4.7 12/16/2021  ? HGB 13.3 12/16/2021  ? HCT 39.7 12/16/2021  ? MCV 93.9 12/16/2021  ? PLT 344.0 12/16/2021  ? ?Pre-op evaluation ?She has discussed risk inherent to procedure. ?Instructed to avoid NSAID's, aspirin,and OTC supplements at least 7 days before procedure. ?Pain management with opioid may be necessary, so side effects discussed. Miralax daily to prevent constipation. ?DVT prophylaxis: Early ambulation. ?Requested labs,CXR,and EKG ordered. ?EKG today NSR, normal axis,and intervals. ?Form will be completed,signed,and faxed to Dr Forrestine Him office. ? ?Hyperlipidemia, unspecified hyperlipidemia type ?Continue non pharmacologic treatment. ?Will plan on checking FLP next CPE. ? ?Return if symptoms worsen or fail to improve. ? ?Caylee Vlachos G. [place], MD ? ?[facility]. ?[place] office. ? ? ?

## 2021-12-16 ENCOUNTER — Encounter: Payer: Self-pay | Admitting: Family Medicine

## 2021-12-16 ENCOUNTER — Ambulatory Visit (INDEPENDENT_AMBULATORY_CARE_PROVIDER_SITE_OTHER): Payer: BC Managed Care – PPO | Admitting: Family Medicine

## 2021-12-16 ENCOUNTER — Other Ambulatory Visit: Payer: Self-pay

## 2021-12-16 ENCOUNTER — Ambulatory Visit (INDEPENDENT_AMBULATORY_CARE_PROVIDER_SITE_OTHER): Payer: BC Managed Care – PPO

## 2021-12-16 VITALS — BP 110/70 | HR 84 | Temp 98.5°F | Resp 16 | Ht 63.0 in | Wt 156.0 lb

## 2021-12-16 DIAGNOSIS — R918 Other nonspecific abnormal finding of lung field: Secondary | ICD-10-CM | POA: Diagnosis not present

## 2021-12-16 DIAGNOSIS — E785 Hyperlipidemia, unspecified: Secondary | ICD-10-CM

## 2021-12-16 DIAGNOSIS — Z01818 Encounter for other preprocedural examination: Secondary | ICD-10-CM

## 2021-12-16 LAB — COMPREHENSIVE METABOLIC PANEL
ALT: 14 U/L (ref 0–35)
AST: 18 U/L (ref 0–37)
Albumin: 4.9 g/dL (ref 3.5–5.2)
Alkaline Phosphatase: 52 U/L (ref 39–117)
BUN: 12 mg/dL (ref 6–23)
CO2: 27 mEq/L (ref 19–32)
Calcium: 9.2 mg/dL (ref 8.4–10.5)
Chloride: 103 mEq/L (ref 96–112)
Creatinine, Ser: 0.62 mg/dL (ref 0.40–1.20)
GFR: 109.13 mL/min (ref >60.00)
Glucose, Bld: 81 mg/dL (ref 70–99)
Potassium: 3.8 mEq/L (ref 3.5–5.1)
Sodium: 136 mEq/L (ref 135–145)
Total Bilirubin: 0.4 mg/dL (ref 0.2–1.2)
Total Protein: 8.2 g/dL (ref 6.0–8.3)

## 2021-12-16 LAB — CBC WITH DIFFERENTIAL/PLATELET
Basophils Absolute: 0 10*3/uL (ref 0.0–0.1)
Basophils Relative: 0.6 % (ref 0.0–3.0)
Eosinophils Absolute: 0 10*3/uL (ref 0.0–0.7)
Eosinophils Relative: 0.6 % (ref 0.0–5.0)
HCT: 39.7 % (ref 36.0–46.0)
Hemoglobin: 13.3 g/dL (ref 12.0–15.0)
Lymphocytes Relative: 29.8 % (ref 12.0–46.0)
Lymphs Abs: 1.4 10*3/uL (ref 0.7–4.0)
MCHC: 33.5 g/dL (ref 30.0–36.0)
MCV: 93.9 fl (ref 78.0–100.0)
Monocytes Absolute: 0.4 10*3/uL (ref 0.1–1.0)
Monocytes Relative: 8 % (ref 3.0–12.0)
Neutro Abs: 2.9 10*3/uL (ref 1.4–7.7)
Neutrophils Relative %: 61 % (ref 43.0–77.0)
Platelets: 344 10*3/uL (ref 150.0–400.0)
RBC: 4.23 Mil/uL (ref 3.87–5.11)
RDW: 13.1 % (ref 11.5–15.5)
WBC: 4.7 10*3/uL (ref 4.0–10.5)

## 2021-12-16 LAB — PROTIME-INR
INR: 1.1 ratio — ABNORMAL HIGH (ref 0.8–1.0)
Prothrombin Time: 12.4 s (ref 9.6–13.1)

## 2021-12-16 LAB — HEMOGLOBIN A1C: Hgb A1c MFr Bld: 5.3 % (ref 4.6–6.5)

## 2021-12-16 NOTE — Patient Instructions (Addendum)
A few things to remember from today's visit: ? ?Pre-op evaluation - Plan: EKG 12-Lead, Comprehensive metabolic panel, CBC with Differential/Platelet, Protime-INR, HIV antibody (with reflex), TSH, Hemoglobin A1c, DG Chest 2 View, HCG, Qualitative ? ?Hyperlipidemia, unspecified hyperlipidemia type ? ?Do not use My Chart to request refills or for acute issues that need immediate attention. ?  ?Please be sure medication list is accurate. ?If a new problem present, please set up appointment sooner than planned today. ? ?Will complete form and sign and then fax to Dr Forrestine Him office with lab results. ? ?Stop over the counter supplements and avoid aspirin or medications like Ibuprofen and naproxen at least 8 days before surgery. If any pain during this time just Acetaminophen. ?Health Maintenance Due  ?Topic Date Due  ? COVID-19 Vaccine (3 - Pfizer risk series) 03/17/2020  ? ? ? ?  12/16/2021  ?  7:24 AM 08/15/2021  ?  9:56 AM 08/04/2019  ?  8:56 AM  ?Depression screen PHQ 2/9  ?Decreased Interest 0 0 0  ?Down, Depressed, Hopeless 0 0 0  ?PHQ - 2 Score 0 0 0  ? ? ?

## 2021-12-19 LAB — HCG, SERUM, QUALITATIVE: Preg, Serum: NEGATIVE

## 2021-12-19 LAB — HIV ANTIBODY (ROUTINE TESTING W REFLEX): HIV 1&2 Ab, 4th Generation: NONREACTIVE

## 2021-12-20 LAB — TSH: TSH: 1.4 u[IU]/mL (ref 0.35–5.50)

## 2021-12-27 ENCOUNTER — Encounter: Payer: Self-pay | Admitting: Family Medicine

## 2022-01-08 ENCOUNTER — Encounter (HOSPITAL_BASED_OUTPATIENT_CLINIC_OR_DEPARTMENT_OTHER): Payer: Self-pay

## 2022-01-08 ENCOUNTER — Other Ambulatory Visit: Payer: Self-pay

## 2022-01-08 DIAGNOSIS — Z5321 Procedure and treatment not carried out due to patient leaving prior to being seen by health care provider: Secondary | ICD-10-CM | POA: Diagnosis not present

## 2022-01-08 DIAGNOSIS — R079 Chest pain, unspecified: Secondary | ICD-10-CM | POA: Diagnosis not present

## 2022-01-08 NOTE — ED Triage Notes (Signed)
Chest pain started earlier this am ?"Hurts to take a deep breath achy feeling" ?Radiates to left shoulder and back ?Denies n/v ? ?

## 2022-01-09 ENCOUNTER — Emergency Department (HOSPITAL_BASED_OUTPATIENT_CLINIC_OR_DEPARTMENT_OTHER): Payer: BC Managed Care – PPO | Admitting: Radiology

## 2022-01-09 ENCOUNTER — Emergency Department (HOSPITAL_BASED_OUTPATIENT_CLINIC_OR_DEPARTMENT_OTHER)
Admission: EM | Admit: 2022-01-09 | Discharge: 2022-01-09 | Disposition: A | Discharge status: Home / Self Care | Payer: BC Managed Care – PPO | Attending: Emergency Medicine | Admitting: Emergency Medicine

## 2022-01-09 LAB — BASIC METABOLIC PANEL
Anion gap: 11 (ref 5–15)
BUN: 15 mg/dL (ref 6–20)
CO2: 22 mmol/L (ref 22–32)
Calcium: 9.2 mg/dL (ref 8.9–10.3)
Chloride: 104 mmol/L (ref 98–111)
Creatinine, Ser: 0.6 mg/dL (ref 0.44–1.00)
GFR, Estimated: >60 mL/min (ref >60)
Glucose, Bld: 85 mg/dL (ref 70–99)
Potassium: 3.6 mmol/L (ref 3.5–5.1)
Sodium: 137 mmol/L (ref 135–145)

## 2022-01-09 LAB — CBC
HCT: 36.3 % (ref 36.0–46.0)
Hemoglobin: 11.7 g/dL — ABNORMAL LOW (ref 12.0–15.0)
MCH: 30 pg (ref 26.0–34.0)
MCHC: 32.2 g/dL (ref 30.0–36.0)
MCV: 93.1 fL (ref 80.0–100.0)
Platelets: 327 10*3/uL (ref 150–400)
RBC: 3.9 MIL/uL (ref 3.87–5.11)
RDW: 12.2 % (ref 11.5–15.5)
WBC: 6.2 10*3/uL (ref 4.0–10.5)
nRBC: 0 % (ref 0.0–0.2)

## 2022-01-09 LAB — PREGNANCY, URINE: Preg Test, Ur: NEGATIVE

## 2022-01-09 LAB — TROPONIN I (HIGH SENSITIVITY): Troponin I (High Sensitivity): <2 ng/L (ref <18)

## 2022-01-19 ENCOUNTER — Encounter: Payer: Self-pay | Admitting: Family Medicine

## 2022-01-26 ENCOUNTER — Encounter: Payer: Self-pay | Admitting: Family Medicine

## 2022-01-27 ENCOUNTER — Telehealth (INDEPENDENT_AMBULATORY_CARE_PROVIDER_SITE_OTHER): Payer: Self-pay | Admitting: Family Medicine

## 2022-01-27 ENCOUNTER — Encounter: Payer: Self-pay | Admitting: Family Medicine

## 2022-01-27 NOTE — Progress Notes (Deleted)
Virtual Visit via Video Note I connected with Kathy Steele on 01/27/22 by a video enabled telemedicine application and verified that I am speaking with the correct person using two identifiers.  Location patient: home Location provider:work or home office Persons participating in the virtual visit: patient, provider  I discussed the limitations of evaluation and management by telemedicine and the availability of in person appointments. The patient expressed understanding and agreed to proceed.  Chief Complaint  Patient presents with   Vaginitis    Has tried OTC remedies without relief.    HPI: Kathy Steele is a 46 yo female with hx of *** ROS: See pertinent positives and negatives per HPI.  Past Medical History:  Diagnosis Date   Headache(784.0)    History of chickenpox     History reviewed. No pertinent surgical history.  Family History  Problem Relation Age of Onset   Hypertension Mother    Diabetes Paternal Uncle    Diabetes Maternal Grandmother    Stroke Maternal Grandmother    Stroke Maternal Grandfather    Arthritis Other    Osteoarthritis Other     Social History   Socioeconomic History   Marital status: Married    Spouse name: Not on file   Number of children: Not on file   Years of education: Not on file   Highest education level: Not on file  Occupational History   Not on file  Tobacco Use   Smoking status: Former    Types: Cigarettes    Quit date: 05/09/2013    Years since quitting: 8.7   Smokeless tobacco: Never   Tobacco comments:    Stopped 1 year ago  Vaping Use   Vaping Use: Never used  Substance and Sexual Activity   Alcohol use: No    Alcohol/week: 0.0 standard drinks    Comment: occasional   Drug use: No   Sexual activity: Not on file  Other Topics Concern   Not on file  Social History Narrative   HH of 3 husband and son no tobacco    Mom smokes when visits   Sleep usually ok   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 , Disp: , Rfl:    BLACK CURRANT SEED OIL PO, Take by mouth., Disp: , Rfl:    cetirizine (ZYRTEC) 10 MG tablet, Take 10 mg by mouth daily., Disp: , Rfl:    ELDERBERRY PO, Take by mouth., Disp: , Rfl:    fluticasone (FLONASE) 50 MCG/ACT nasal spray, Place 2 sprays into both nostrils daily., Disp: 16 g, Rfl: 0   Multiple Vitamin (MULTIVITAMIN WITH MINERALS) TABS tablet, Take 1 tablet by mouth daily., Disp: , Rfl:    naproxen sodium (ALEVE) 220 MG tablet, Take 220 mg by mouth as needed., Disp: , Rfl:  EXAM:  VITALS per patient if applicable:  GENERAL: alert, oriented, appears well and in no acute distress  HEENT: atraumatic, conjunctiva clear, no obvious abnormalities on inspection of external nose and ears  NECK: normal movements of the head and neck  LUNGS: on inspection no signs of respiratory distress, breathing rate appears normal, no obvious gross SOB, gasping or wheezing  CV: no obvious cyanosis  MS: moves all visible extremities without noticeable abnormality  PSYCH/NEURO: pleasant and cooperative, no obvious depression or anxiety, speech and thought processing grossly intact  ASSESSMENT AND PLAN:  Discussed the following assessment and plan:  No diagnosis found.  No problem-specific Assessment & Plan notes found for this encounter.    We discussed possible serious and likely etiologies, options for evaluation and  workup, limitations of telemedicine visit vs in person visit, treatment, treatment risks and precautions. The patient was advised to call back or seek an in-person evaluation if the symptoms worsen or if the condition fails to improve as anticipated. I discussed the assessment and treatment plan with the patient. The patient was provided an opportunity to ask questions and all were answered. The patient agreed with the plan and demonstrated an understanding of the instructions.   No follow-ups on file.

## 2022-04-17 NOTE — Progress Notes (Signed)
HPI: Kathy Steele is a 46 y.o. female, who is here today to follow up after her surgery. She underwent esthetic procedure in [place], liposuction and buttocks fat transferred on 01/10/22. No complications during or right after procedure. She developed generalized edema a few days after, including parts of her face but mainly on LE's.It has gradually improved, still has LE edema. It seems to be worse at the end of the day. Negative for fever,SOB,PND,orthopnea,abdominal pain, decreased urine output,gross hematuria,or foam in urine.  Post surgical pain has resolved, has some numb areas on abdominal wall. She can sit, feels different but no pain. She has not noted rash,erythema,or ulcers.  She is following a healthier diet and walking daily.  Last CBC showed mild anemia. Heavy menses. She has not noted blood in stool or melena.  Component     Latest Ref Rng 01/08/2022  WBC     4.0 - 10.5 K/uL 6.2   RBC     3.87 - 5.11 Mil/uL 3.90   Hemoglobin     12.0 - 15.0 g/dL 03.5 (L)   HCT     46.5 - 46.0 % 36.3   MCV     78.0 - 100.0 fl 93.1   MCHC     30.0 - 36.0 g/dL 68.1   RDW     27.5 - 17.0 % 12.2   Platelets     150.0 - 400.0 K/uL 327    Review of Systems  Constitutional:  Negative for activity change, appetite change and chills.  HENT:  Negative for mouth sores, nosebleeds and trouble swallowing.   Eyes:  Negative for redness and visual disturbance.  Respiratory:  Negative for cough and wheezing.   Gastrointestinal:  Negative for nausea and vomiting.       Negative for changes in bowel habits.  Genitourinary:  Negative for decreased urine volume, dysuria and hematuria.  Skin:  Negative for rash.  Neurological:  Negative for syncope, weakness, numbness and headaches.  Rest see pertinent positives and negatives per HPI.  Current Outpatient Medications on File Prior to Visit  Medication Sig Dispense Refill   Aspirin-Acetaminophen-Caffeine (GOODYS EXTRA STRENGTH PO)  Take 1 packet by mouth daily as needed (headache).      BLACK CURRANT SEED OIL PO Take by mouth.     cetirizine (ZYRTEC) 10 MG tablet Take 10 mg by mouth daily.     ELDERBERRY PO Take by mouth.     fluticasone (FLONASE) 50 MCG/ACT nasal spray Place 2 sprays into both nostrils daily. 16 g 0   Multiple Vitamin (MULTIVITAMIN WITH MINERALS) TABS tablet Take 1 tablet by mouth daily.     naproxen sodium (ALEVE) 220 MG tablet Take 220 mg by mouth as needed.     No current facility-administered medications on file prior to visit.   Past Medical History:  Diagnosis Date   Headache(784.0)    History of chickenpox    Allergies  Allergen Reactions   Doxycycline Other (See Comments)   Escitalopram Oxalate     REACTION: Nausea, Ha and felt spacy   Social History   Socioeconomic History   Marital status: Married    Spouse name: Not on file   Number of children: Not on file   Years of education: Not on file   Highest education level: Not on file  Occupational History   Not on file  Tobacco Use   Smoking status: Former    Types: Cigarettes    Quit date: 05/09/2013  Years since quitting: 8.9   Smokeless tobacco: Never   Tobacco comments:    Stopped 1 year ago  Vaping Use   Vaping Use: Never used  Substance and Sexual Activity   Alcohol use: No    Alcohol/week: 0.0 standard drinks of alcohol    Comment: occasional   Drug use: No   Sexual activity: Not on file  Other Topics Concern   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  Physical Exam Vitals and nursing note reviewed.  Constitutional:      General: She is not in acute distress.    Appearance: She is well-developed.  HENT:     Head: Normocephalic and atraumatic.     Mouth/Throat:     Mouth: Mucous membranes are moist.     Pharynx: Oropharynx is clear.  Eyes:     Conjunctiva/sclera: Conjunctivae normal.  Cardiovascular:     Rate and Rhythm: Normal rate and regular rhythm.     Pulses:          Dorsalis pedis pulses are 2+ on the right side and 2+ on the left side.     Heart sounds: No murmur heard.    Comments: LE edema and pedal,  L>R  Pulmonary:     Effort: Pulmonary effort is normal. No respiratory distress.     Breath sounds: Normal breath sounds.  Abdominal:     Palpations: Abdomen is soft. There is no hepatomegaly or mass.     Tenderness: There is no abdominal tenderness.  Musculoskeletal:     Right lower leg: 1+ Pitting Edema present.     Left lower leg: 1+ Pitting Edema present.  Skin:    General: Skin is warm.     Findings: No erythema or rash.  Neurological:     General: No focal deficit present.     Mental Status: She is alert and oriented to person, place, and time.     Cranial Nerves: No cranial nerve deficit.     Gait: Gait normal.  Psychiatric:     Comments: Well groomed, good eye contact.   ASSESSMENT AND PLAN:  Ms.Ahliya was seen today for follow-up.  Diagnoses and all orders for this visit: Orders Placed This Encounter  Procedures   Basic metabolic panel   CBC   Iron   Ferritin   Lab Results  Component Value Date   CREATININE 0.65 04/18/2022   BUN 13 04/18/2022   NA 138  04/18/2022   K 4.1 04/18/2022   CL  104 04/18/2022   CO2 26 04/18/2022   Lab Results  Component Value Date   WBC 5.9 04/18/2022   HGB 11.8 (L) 04/18/2022   HCT 35.7 (L) 04/18/2022   MCV 91.2 04/18/2022   PLT 364.0 04/18/2022   Bilateral lower extremity edema Problem has improved. Explained that it is not uncommon to have LE edema after abdominal surgery and other procedures. Compression stocking will help. Further recommendations according to lab results.  Iron deficiency anemia due to chronic blood loss Most likely caused by heavy menses, other possible causes discussed. Further recommendations according to CBC and iron studies.  Return if symptoms worsen or fail to improve.  Hilliary Jock G. [place], MD  [facility]. [place] office.

## 2022-04-18 ENCOUNTER — Ambulatory Visit (INDEPENDENT_AMBULATORY_CARE_PROVIDER_SITE_OTHER): Payer: BC Managed Care – PPO | Admitting: Family Medicine

## 2022-04-18 ENCOUNTER — Encounter: Payer: Self-pay | Admitting: Family Medicine

## 2022-04-18 VITALS — BP 120/70 | HR 66 | Temp 98.7°F | Resp 12 | Ht 64.0 in | Wt 148.5 lb

## 2022-04-18 DIAGNOSIS — R600 Localized edema: Secondary | ICD-10-CM | POA: Diagnosis not present

## 2022-04-18 DIAGNOSIS — D500 Iron deficiency anemia secondary to blood loss (chronic): Secondary | ICD-10-CM

## 2022-04-18 DIAGNOSIS — D649 Anemia, unspecified: Secondary | ICD-10-CM

## 2022-04-18 NOTE — Patient Instructions (Addendum)
A few things to remember from today's visit:  Bilateral lower extremity edema - Plan: Basic metabolic panel, CBC  Anemia, unspecified type - Plan: Iron, Ferritin  If you need refills please call your pharmacy. Do not use My Chart to request refills or for acute issues that need immediate attention.   Continue lower extremity elevation and compression stockings.  Please be sure medication list is accurate. If a new problem present, please set up appointment sooner than planned today.

## 2022-04-19 LAB — CBC
HCT: 35.7 % — ABNORMAL LOW (ref 36.0–46.0)
Hemoglobin: 11.8 g/dL — ABNORMAL LOW (ref 12.0–15.0)
MCHC: 33 g/dL (ref 30.0–36.0)
MCV: 91.2 fl (ref 78.0–100.0)
Platelets: 364 10*3/uL (ref 150.0–400.0)
RBC: 3.91 Mil/uL (ref 3.87–5.11)
RDW: 14.7 % (ref 11.5–15.5)
WBC: 5.9 10*3/uL (ref 4.0–10.5)

## 2022-04-19 LAB — BASIC METABOLIC PANEL
BUN: 13 mg/dL (ref 6–23)
CO2: 26 mEq/L (ref 19–32)
Calcium: 9.4 mg/dL (ref 8.4–10.5)
Chloride: 104 mEq/L (ref 96–112)
Creatinine, Ser: 0.65 mg/dL (ref 0.40–1.20)
GFR: 107.63 mL/min (ref >60.00)
Glucose, Bld: 88 mg/dL (ref 70–99)
Potassium: 4.1 mEq/L (ref 3.5–5.1)
Sodium: 138 mEq/L (ref 135–145)

## 2022-04-19 LAB — FERRITIN: Ferritin: 12.2 ng/mL (ref 10.0–291.0)

## 2022-04-19 LAB — IRON: Iron: 32 ug/dL — ABNORMAL LOW (ref 42–145)

## 2022-04-21 MED ORDER — FERROUS SULFATE 325 (65 FE) MG PO TABS: 325.0000 mg | ORAL_TABLET | Freq: Every day | ORAL | 1 refills | Status: DC

## 2022-05-16 DIAGNOSIS — G8929 Other chronic pain: Secondary | ICD-10-CM | POA: Diagnosis not present

## 2022-05-16 DIAGNOSIS — Z0000 Encounter for general adult medical examination without abnormal findings: Secondary | ICD-10-CM | POA: Diagnosis not present

## 2022-05-16 DIAGNOSIS — Z114 Encounter for screening for human immunodeficiency virus [HIV]: Secondary | ICD-10-CM | POA: Diagnosis not present

## 2022-05-16 DIAGNOSIS — Z136 Encounter for screening for cardiovascular disorders: Secondary | ICD-10-CM | POA: Diagnosis not present

## 2022-05-16 DIAGNOSIS — R1031 Right lower quadrant pain: Secondary | ICD-10-CM | POA: Diagnosis not present

## 2022-05-17 ENCOUNTER — Other Ambulatory Visit: Payer: BC Managed Care – PPO

## 2022-05-18 ENCOUNTER — Other Ambulatory Visit (INDEPENDENT_AMBULATORY_CARE_PROVIDER_SITE_OTHER): Payer: BC Managed Care – PPO

## 2022-05-18 ENCOUNTER — Other Ambulatory Visit: Payer: Self-pay

## 2022-05-18 DIAGNOSIS — D500 Iron deficiency anemia secondary to blood loss (chronic): Secondary | ICD-10-CM

## 2022-05-18 LAB — FECAL OCCULT BLOOD, IMMUNOCHEMICAL: Fecal Occult Bld: NEGATIVE

## 2022-06-01 DIAGNOSIS — R1031 Right lower quadrant pain: Secondary | ICD-10-CM | POA: Diagnosis not present

## 2022-06-01 DIAGNOSIS — M25551 Pain in right hip: Secondary | ICD-10-CM | POA: Diagnosis not present

## 2022-06-29 DIAGNOSIS — L700 Acne vulgaris: Secondary | ICD-10-CM | POA: Diagnosis not present

## 2022-06-29 DIAGNOSIS — E663 Overweight: Secondary | ICD-10-CM | POA: Diagnosis not present

## 2022-06-29 DIAGNOSIS — L918 Other hypertrophic disorders of the skin: Secondary | ICD-10-CM | POA: Diagnosis not present

## 2022-06-30 DIAGNOSIS — M25551 Pain in right hip: Secondary | ICD-10-CM | POA: Diagnosis not present

## 2022-06-30 DIAGNOSIS — S76211D Strain of adductor muscle, fascia and tendon of right thigh, subsequent encounter: Secondary | ICD-10-CM | POA: Diagnosis not present

## 2022-06-30 DIAGNOSIS — R1031 Right lower quadrant pain: Secondary | ICD-10-CM | POA: Diagnosis not present

## 2022-07-17 ENCOUNTER — Ambulatory Visit (INDEPENDENT_AMBULATORY_CARE_PROVIDER_SITE_OTHER): Payer: BC Managed Care – PPO

## 2022-07-17 ENCOUNTER — Ambulatory Visit: Payer: BC Managed Care – PPO | Admitting: Sports Medicine

## 2022-07-17 VITALS — BP 130/82 | HR 81 | Ht 64.0 in | Wt 148.0 lb

## 2022-07-17 DIAGNOSIS — M25531 Pain in right wrist: Secondary | ICD-10-CM

## 2022-07-17 DIAGNOSIS — M79641 Pain in right hand: Secondary | ICD-10-CM

## 2022-07-17 MED ORDER — MELOXICAM 15 MG PO TABS: 15.0000 mg | ORAL_TABLET | Freq: Every day | ORAL | 0 refills | Status: DC

## 2022-07-17 NOTE — Patient Instructions (Addendum)
Good to see you - Start meloxicam 15 mg daily x2 weeks.  If still having pain after 2 weeks, complete 3rd-week of meloxicam. May use remaining meloxicam as needed once daily for pain control.  Do not to use additional NSAIDs while taking meloxicam.  May use Tylenol [phone] mg 2 to 3 times a day for breakthrough pain. Recommend using wrist brace during the day  Wrist HEP  2 week follow up and enjoy your cruise

## 2022-07-17 NOTE — Progress Notes (Signed)
Kathy Steele D.[facility] [address] [place] [address] Phone: [phone]   Assessment and Plan:     1. Right wrist pain 2. Right hand pain -Chronic with exacerbation, initial sports medicine visit - Years of intermittent hand and wrist pain with flare of pain after FOOSH injury 1 to 2 days ago - X-ray obtained in clinic.  My interpretation: No acute fracture or dislocation.  Unremarkable imaging - Start meloxicam 15 mg daily x2 weeks.  If still having pain after 2 weeks, complete 3rd-week of meloxicam. May use remaining meloxicam as needed once daily for pain control.  Do not to use additional NSAIDs while taking meloxicam.  May use Tylenol [phone] mg 2 to 3 times a day for breakthrough pain. - Recommend using wrist brace during the day for protection, but may come out of it at night - Start HEP to prevent stiffness and maintain ROM - DG Hand Complete Right; Future    Pertinent previous records reviewed include none   Follow Up: 2 weeks for reevaluation.  If still TTP, would repeat imaging   Subjective:   I, Kathy Steele, am serving as a Neurosurgeon for Doctor Richardean Sale  Chief Complaint: right wrist pain   HPI:   07/17/22 Patient is a 46 year old female complaining of right wrist pain. 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   Current Outpatient Medications:    Aspirin-Acetaminophen-Caffeine (GOODYS EXTRA STRENGTH PO), Take 1 packet by mouth daily as needed  (headache). , Disp: , Rfl:    BLACK CURRANT SEED OIL PO, Take by mouth., Disp: , Rfl:    cetirizine (ZYRTEC) 10 MG tablet, Take 10 mg by mouth daily., Disp: , Rfl:    ELDERBERRY PO, Take by mouth., Disp: , Rfl:    ferrous sulfate 325 (65 FE) MG tablet, Take 1 tablet (325 mg total) by mouth daily., Disp: 90 tablet, Rfl: 1   fluticasone (FLONASE) 50 MCG/ACT nasal spray, Place 2 sprays into both nostrils daily., Disp: 16 g, Rfl: 0   meloxicam (MOBIC) 15 MG tablet, Take 1 tablet (15 mg total) by mouth daily., Disp: 30 tablet, Rfl: 0   Multiple Vitamin (MULTIVITAMIN WITH MINERALS) TABS tablet, Take 1 tablet by mouth daily., Disp: , Rfl:    naproxen sodium (ALEVE) 220 MG tablet, Take 220 mg by mouth as needed., Disp: , Rfl:    Objective:     Vitals:   07/17/22 1548  BP: 130/82  Pulse: 81  SpO2: 100%  Weight: 148 lb (67.1 kg)  Height: 5\' 4"  (1.626 m)      Body mass index is 25.4 kg/m.    Physical Exam:    General: Appears well, nad, nontoxic and pleasant Neuro:sensation intact, strength is 5/5 with df/pf/inv/ev, muscle tone wnl Skin:no susupicious lesions or rashes  Right wrist:  No deformity or swelling appreciated. ROM  Ext 70 and moderately painful, flexion 60 with mild pain, radial/ulnar deviation 25  with mild pain nttp over the snauff box, dorsal carpals, volar carpals, radial styloid, ulnar styloid, 1st mcp, tfcc Negative Tinel's Negative finklestein Neg tfcc bounce test No pain with resisted  , flex or deviation  Mild pain with resisted extension  Electronically signed by:  Benito Mccreedy D.[facility] 4:15 PM 07/17/22

## 2022-07-19 NOTE — Progress Notes (Signed)
Pt called back and spoke with Dr. Glennon Mac

## 2022-07-29 DIAGNOSIS — Z20822 Contact with and (suspected) exposure to covid-19: Secondary | ICD-10-CM | POA: Diagnosis not present

## 2022-07-29 DIAGNOSIS — J101 Influenza due to other identified influenza virus with other respiratory manifestations: Secondary | ICD-10-CM | POA: Diagnosis not present

## 2022-08-03 ENCOUNTER — Ambulatory Visit: Payer: BC Managed Care – PPO | Admitting: Sports Medicine

## 2022-08-07 NOTE — Progress Notes (Signed)
Appointment cancelled - please disregard. 

## 2022-08-07 NOTE — Progress Notes (Signed)
No SHOW

## 2022-08-09 NOTE — Progress Notes (Unsigned)
    Aleen Sells D.[facility] [address] [place] [address] Phone: [phone]   Assessment and Plan:     There are no diagnoses linked to this encounter.  ***   Pertinent previous records reviewed include ***   Follow Up: ***     Subjective:   I, Shiann Kam, am serving as a Neurosurgeon for Doctor Richardean Sale   Chief Complaint: right wrist pain    HPI:    07/17/22 Patient is a 46 year old female complaining of right wrist pain. 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   Current Outpatient Medications:    Aspirin-Acetaminophen-Caffeine (GOODYS EXTRA STRENGTH PO), Take 1 packet by mouth daily as needed (headache). , Disp: , Rfl:    BLACK CURRANT SEED OIL PO, Take by mouth., Disp: , Rfl:    cetirizine (ZYRTEC) 10 MG tablet, Take 10 mg by mouth daily., Disp: , Rfl:    ELDERBERRY PO, Take by mouth., Disp: , Rfl:    ferrous sulfate 325 (65 FE) MG tablet, Take 1 tablet (325 mg total) by mouth daily., Disp: 90 tablet, Rfl: 1   fluticasone (FLONASE) 50 MCG/ACT nasal spray, Place 2 sprays into both nostrils daily., Disp: 16 g, Rfl: 0   meloxicam (MOBIC) 15 MG tablet, Take 1 tablet (15 mg total) by mouth daily., Disp: 30 tablet, Rfl: 0   Multiple Vitamin (MULTIVITAMIN WITH MINERALS) TABS tablet, Take 1 tablet by mouth daily., Disp: , Rfl:    naproxen sodium (ALEVE) 220 MG tablet, Take 220 mg by mouth as needed., Disp: , Rfl:    Objective:      There were no vitals filed for this visit.    There is no height or weight on file to calculate BMI.    Physical Exam:    ***   Electronically signed by:  Aleen Sells [facility] 3:36 PM 08/09/22

## 2022-08-10 ENCOUNTER — Ambulatory Visit: Payer: BC Managed Care – PPO | Admitting: Sports Medicine

## 2022-08-10 VITALS — BP 118/68 | HR 78 | Ht 64.0 in | Wt 154.0 lb

## 2022-08-10 DIAGNOSIS — M25531 Pain in right wrist: Secondary | ICD-10-CM

## 2022-08-10 DIAGNOSIS — S63639A Sprain of interphalangeal joint of unspecified finger, initial encounter: Secondary | ICD-10-CM

## 2022-08-10 DIAGNOSIS — M79641 Pain in right hand: Secondary | ICD-10-CM | POA: Diagnosis not present

## 2022-08-10 NOTE — Patient Instructions (Signed)
Good to see you   

## 2022-08-15 NOTE — Progress Notes (Deleted)
HPI: [name] is a 46 y.o. female, who is here today for her routine physical.  Last CPE: 08/15/21  Regular exercise 3 or more time per week: *** Following a healthy diet: ***  Chronic medical problems: ***  Immunization History  Administered Date(s) Administered   Influenza Split 08/31/2011, 09/16/2012, 07/25/2013   Influenza Whole 07/06/2010   Influenza,inj,Quad PF,6+ Mos 07/30/2018, 08/04/2019, 08/11/2020, 08/15/2021   PFIZER(Purple Top)SARS-COV-2 Vaccination 12/31/2019, 02/18/2020   Td 07/06/2010   Tdap 08/11/2020   Health Maintenance  Topic Date Due   COVID-19 Vaccine (3 - Pfizer risk series) 03/17/2020   INFLUENZA VACCINE  04/25/2022   PAP SMEAR-Modifier  09/03/2022   Hepatitis C Screening  Completed   HIV Screening  Completed   HPV VACCINES  Aged Out    She has *** concerns today.  Review of Systems  Current Outpatient Medications on File Prior to Visit  Medication Sig Dispense Refill   Aspirin-Acetaminophen-Caffeine (GOODYS EXTRA STRENGTH PO) Take 1 packet by mouth daily as needed (headache).      BLACK CURRANT SEED OIL PO Take by mouth.     cetirizine (ZYRTEC) 10 MG tablet Take 10 mg by mouth daily.     ELDERBERRY PO Take by mouth.     ferrous sulfate 325 (65 FE) MG tablet Take 1 tablet (325 mg total) by mouth daily. 90 tablet 1   fluticasone (FLONASE) 50 MCG/ACT nasal spray Place 2 sprays into both nostrils daily. 16 g 0   meloxicam (MOBIC) 15 MG tablet Take 1 tablet (15 mg total) by mouth daily. 30 tablet 0   Multiple Vitamin (MULTIVITAMIN WITH MINERALS) TABS tablet Take 1 tablet by mouth daily.     naproxen sodium (ALEVE) 220 MG tablet Take 220 mg by mouth as needed.     No current facility-administered medications on file prior to visit.    Past Medical History:  Diagnosis Date   Headache(784.0)    History of chickenpox     No past surgical history on file.  Allergies  Allergen Reactions   Doxycycline Other (See Comments)    Escitalopram Oxalate     REACTION: Nausea, Ha and felt spacy    Family History  Problem Relation Age of Onset   Hypertension Mother    Diabetes Paternal Uncle    Diabetes Maternal Grandmother    Stroke Maternal Grandmother    Stroke Maternal Grandfather    Arthritis Other    Osteoarthritis Other     Social History   Socioeconomic History   Marital status: Married    Spouse name: Not on file   Number of children: Not on file   Years of education: Not on file   Highest education level: Not on file  Occupational History   Not on file  Tobacco Use   Smoking status: Former    Types: Cigarettes    Quit date: 05/09/2013    Years since quitting: 9.2   Smokeless tobacco: Never   Tobacco comments:    Stopped 1 year ago  Vaping Use   Vaping Use: Never used  Substance and Sexual Activity   Alcohol use: No    Alcohol/week: 0.0 standard drinks of alcohol    Comment: occasional   Drug use: No   Sexual activity: Not on file  Other Topics Concern   Not on file  Social History Narrative   HH of 3 husband and son no tobacco    Mom smokes when visits   Sleep usually ok  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 There is no height or weight on file to calculate BMI.  Wt Readings from Last 3 Encounters:  08/10/22 154 lb (69.9 kg)  07/17/22 148 lb (67.1 kg)  04/18/22 148 lb 8 oz (67.4 kg)    Physical Exam  ASSESSMENT AND PLAN: [name] was here today annual physical examination.  No orders of the defined types were placed in this  encounter.   There are no diagnoses linked to this encounter.  There are no diagnoses linked to this encounter.  No follow-ups on file.  Betty G. [place], MD  [facility]. [place] office.

## 2022-08-16 ENCOUNTER — Encounter: Payer: BC Managed Care – PPO | Admitting: Family Medicine

## 2022-08-16 DIAGNOSIS — E785 Hyperlipidemia, unspecified: Secondary | ICD-10-CM

## 2022-08-16 DIAGNOSIS — Z0000 Encounter for general adult medical examination without abnormal findings: Secondary | ICD-10-CM

## 2022-09-12 DIAGNOSIS — Z01419 Encounter for gynecological examination (general) (routine) without abnormal findings: Secondary | ICD-10-CM | POA: Diagnosis not present

## 2022-09-12 DIAGNOSIS — Z113 Encounter for screening for infections with a predominantly sexual mode of transmission: Secondary | ICD-10-CM | POA: Diagnosis not present

## 2022-09-12 DIAGNOSIS — Z1239 Encounter for other screening for malignant neoplasm of breast: Secondary | ICD-10-CM | POA: Diagnosis not present

## 2022-09-12 DIAGNOSIS — Z124 Encounter for screening for malignant neoplasm of cervix: Secondary | ICD-10-CM | POA: Diagnosis not present

## 2022-09-13 NOTE — Progress Notes (Deleted)
HPI: [name] is a 46 y.o. female with PMHx significant for migraine headaches, seasonal allergies, and HLD here today for her routine physical.  Last CPE: 08/15/21  Regular exercise 3 or more time per week: *** Following a healthy diet: ***  Immunization History  Administered Date(s) Administered   Influenza Split 08/31/2011, 09/16/2012, 07/25/2013   Influenza Whole 07/06/2010   Influenza,inj,Quad PF,6+ Mos 07/30/2018, 08/04/2019, 08/11/2020, 08/15/2021   PFIZER(Purple Top)SARS-COV-2 Vaccination 12/31/2019, 02/18/2020   Td 07/06/2010   Tdap 08/11/2020   Health Maintenance  Topic Date Due   COVID-19 Vaccine (3 - Pfizer risk series) 03/17/2020   INFLUENZA VACCINE  04/25/2022   PAP SMEAR-Modifier  09/03/2022   DTaP/Tdap/Td (3 - Td or Tdap) 08/11/2030   Hepatitis C Screening  Completed   HIV Screening  Completed   HPV VACCINES  Aged Out   Lab Results  Component Value Date   CHOL 232 (H) 08/15/2021   HDL 74.80 08/15/2021   LDLCALC 145 (H) 08/15/2021   LDLDIRECT 131.6 09/09/2013   TRIG 62.0 08/15/2021   CHOLHDL 3 08/15/2021    She has *** concerns today.  Review of Systems  Constitutional:  Negative for activity change, appetite change and fever.  HENT:  Negative for hearing loss, mouth sores, sore throat and trouble swallowing.   Eyes:  Negative for redness and visual disturbance.  Respiratory:  Negative for cough, shortness of breath and wheezing.   Cardiovascular:  Negative for chest pain and leg swelling.  Gastrointestinal:  Negative for abdominal pain, nausea and vomiting.       No changes in bowel habits.  Endocrine: Negative for cold intolerance, heat intolerance, polydipsia, polyphagia and polyuria.  Genitourinary:  Negative for decreased urine volume, dysuria, hematuria, vaginal bleeding and vaginal discharge.  Musculoskeletal:  Negative for gait problem and myalgias.  Skin:  Negative for color change and rash.  Allergic/Immunologic: Positive  for environmental allergies.  Neurological:  Negative for seizures, syncope, weakness and headaches.  Hematological:  Negative for adenopathy. Does not bruise/bleed easily.  Psychiatric/Behavioral:  Negative for confusion. The patient is not nervous/anxious.   All other systems reviewed and are negative.   Current Outpatient Medications on File Prior to Visit  Medication Sig Dispense Refill   Aspirin-Acetaminophen-Caffeine (GOODYS EXTRA STRENGTH PO) Take 1 packet by mouth daily as needed (headache).      BLACK CURRANT SEED OIL PO Take by mouth.     cetirizine (ZYRTEC) 10 MG tablet Take 10 mg by mouth daily.     ELDERBERRY PO Take by mouth.     ferrous sulfate 325 (65 FE) MG tablet Take 1 tablet (325 mg total) by mouth daily. 90 tablet 1   fluticasone (FLONASE) 50 MCG/ACT nasal spray Place 2 sprays into both nostrils daily. 16 g 0   meloxicam (MOBIC) 15 MG tablet Take 1 tablet (15 mg total) by mouth daily. 30 tablet 0   Multiple Vitamin (MULTIVITAMIN WITH MINERALS) TABS tablet Take 1 tablet by mouth daily.     naproxen sodium (ALEVE) 220 MG tablet Take 220 mg by mouth as needed.     No current facility-administered medications on file prior to visit.   Past Medical History:  Diagnosis Date   Headache(784.0)    History of chickenpox    No past surgical history on file.  Allergies  Allergen Reactions   Doxycycline Other (See Comments)   Escitalopram Oxalate     REACTION: Nausea, Ha and felt spacy   Family History  Problem Relation  Age of Onset   Hypertension Mother    Diabetes Paternal Uncle    Diabetes Maternal Grandmother    Stroke Maternal Grandmother    Stroke Maternal Grandfather    Arthritis Other    Osteoarthritis Other     Social History   Socioeconomic History   Marital status: Married    Spouse name: Not on file   Number of children: Not on file   Years of education: Not on file   Highest education level: Not on file  Occupational History   Not on file   Tobacco Use   Smoking status: Former    Types: Cigarettes    Quit date: 05/09/2013    Years since quitting: 9.3   Smokeless tobacco: Never   Tobacco comments:    Stopped 1 year ago  Vaping Use   Vaping Use: Never used  Substance and Sexual Activity   Alcohol use: No    Alcohol/week: 0.0 standard drinks of alcohol    Comment: occasional   Drug use: No   Sexual activity: Not on file  Other Topics Concern   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 There is no height or weight on file to calculate BMI.  Wt Readings from Last 3 Encounters:  08/10/22 154 lb (69.9 kg)  07/17/22 148 lb (67.1 kg)  04/18/22 148 lb 8 oz (67.4 kg)    Physical Exam  ASSESSMENT AND PLAN: Kathy Steele was here today annual physical examination.  No orders of the defined types were placed in this encounter.   There are no diagnoses linked to this encounter.  There are no diagnoses linked to this encounter.  No follow-ups on file.  Betty G. [place], MD  [facility]. [place] office.

## 2022-09-15 ENCOUNTER — Encounter: Payer: BC Managed Care – PPO | Admitting: Family Medicine

## 2022-09-15 DIAGNOSIS — E785 Hyperlipidemia, unspecified: Secondary | ICD-10-CM

## 2022-09-15 DIAGNOSIS — Z0000 Encounter for general adult medical examination without abnormal findings: Secondary | ICD-10-CM

## 2022-09-19 DIAGNOSIS — Z1231 Encounter for screening mammogram for malignant neoplasm of breast: Secondary | ICD-10-CM | POA: Diagnosis not present

## 2022-10-11 DIAGNOSIS — H33332 Multiple defects of retina without detachment, left eye: Secondary | ICD-10-CM | POA: Diagnosis not present

## 2022-10-11 DIAGNOSIS — H2513 Age-related nuclear cataract, bilateral: Secondary | ICD-10-CM | POA: Diagnosis not present

## 2022-10-11 DIAGNOSIS — H35462 Secondary vitreoretinal degeneration, left eye: Secondary | ICD-10-CM | POA: Diagnosis not present

## 2022-10-11 DIAGNOSIS — H43823 Vitreomacular adhesion, bilateral: Secondary | ICD-10-CM | POA: Diagnosis not present

## 2022-10-26 HISTORY — PX: EYE SURGERY: SHX253

## 2022-11-09 ENCOUNTER — Ambulatory Visit: Payer: BC Managed Care – PPO | Admitting: Family Medicine

## 2022-11-09 ENCOUNTER — Encounter: Payer: Self-pay | Admitting: Family Medicine

## 2022-11-09 ENCOUNTER — Ambulatory Visit (INDEPENDENT_AMBULATORY_CARE_PROVIDER_SITE_OTHER): Payer: BC Managed Care – PPO

## 2022-11-09 ENCOUNTER — Ambulatory Visit: Payer: Self-pay

## 2022-11-09 VITALS — BP 110/76 | HR 66 | Ht 64.0 in | Wt 157.0 lb

## 2022-11-09 DIAGNOSIS — M25562 Pain in left knee: Secondary | ICD-10-CM | POA: Diagnosis not present

## 2022-11-09 DIAGNOSIS — M25561 Pain in right knee: Secondary | ICD-10-CM

## 2022-11-09 DIAGNOSIS — G8929 Other chronic pain: Secondary | ICD-10-CM

## 2022-11-09 DIAGNOSIS — L700 Acne vulgaris: Secondary | ICD-10-CM | POA: Diagnosis not present

## 2022-11-09 NOTE — Patient Instructions (Signed)
Thank you for coming in today.   Please get an Xray today before you leave    Please use Voltaren gel (Generic Diclofenac Gel) up to 4x daily for pain as needed.  This is available over-the-counter as both the name brand Voltaren gel and the generic diclofenac gel.   I've referred you to Physical Therapy.  Let us know if you don't hear from them in one week.   Recheck in about 8 weeks.

## 2022-11-09 NOTE — Progress Notes (Signed)
I, Kathy Steele, CMA acting as a scribe for Kathy Leader, MD.  Kathy Steele is a 47 y.o. female who presents to [place] [facility] today for knee pain.  Patient was previously seen by Dr. Glennon Mac on 08/10/2022 for right wrist/hand pain.    Today, patient reports BILAT , L>R, knee pain. Left knee pain over the past year. RIGHT knee pain over the past couple of days.     Patient locates pain to peripatellar  Knee swelling: intermittent Mechanical symptoms: intermittently feels like the knee will give out, no other mechanical sx.  Radiates: denies Aggravates: Some pain during and after walking dog. Worse after going to the gym. Sx worse with ascending stairs.  Treatments tried: Tylenol ES with short-term relief.   RIGHT knee - throbbing pain after doing the Elliptical  Denies swelling. Denies mechanical sx   Pertinent review of systems: No fevers or chills  Relevant historical information: Migraine   Exam:  BP 110/76   Pulse 66   Ht 5' 4"$  (1.626 m)   Wt 157 lb (71.2 kg)   SpO2 100%   BMI 26.95 kg/m  General: Well Developed, well nourished, and in no acute distress.   MSK: Right knee: Normal-appearing Normal motion with crepitation.  Nontender. Stable ligamentous exam. Intact strength. Negative McMurray's test.  Left knee: Normal appearing Normal motion with crepitation.  Nontender. Stable ligamentous exam. Intact strength. Negative Murray's test.    Lab and Radiology Results  Diagnostic Limited MSK Ultrasound of: Right knee  Quad tendon normal-appearing Patellar tendon normal appearing Lateral joint line normal appearing Medial joint line normal appearing Posterior knee no Baker's cyst. Impression: Normal MSK ultrasound of the knee  Diagnostic Limited MSK Ultrasound of: Left knee: Quad tendon normal-appearing Patellar tendon normal-appearing Lateral joint line normal appearing Medial joint line normal appearing Posterior  knee no Baker's cyst. Impression: Normal MSK ultrasound of the knee  X-ray images bilateral knees obtained today personally and independently interpreted  Right knee: Minimal medial DJD.  No acute fractures.  Left knee: Minimal medial DJD.  No acute fractures.  Await formal radiology review   Assessment and Plan: 47 y.o. female with bilateral anterior knee pain worse with exercise.  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  There may be some component of pain due to DJD as well.  She is an excellent candidate for physical therapy.  Plan to refer to PT.  Recommend also Voltaren gel. Recheck in about 8 weeks.   PDMP not reviewed this encounter. Orders Placed This Encounter  Procedures   [place] LIMITED JOINT SPACE STRUCTURES LOW RIGHT    Order Specific Question:   Reason for Exam (SYMPTOM  OR DIAGNOSIS REQUIRED)    Answer:   knee pain    Order Specific Question:   Preferred imaging location?    Answer:   [facility]   DG Knee AP/LAT W/Sunrise Right    Standing Status:   Future    Number of Occurrences:   1    Standing Expiration Date:   11/10/2023    Order Specific Question:   Reason for Exam (SYMPTOM  OR DIAGNOSIS REQUIRED)    Answer:   eval knee pain    Order Specific Question:   Is patient pregnant?    Answer:   No    Order Specific Question:   Preferred imaging location?    Answer:   [facility]   DG Knee AP/LAT W/Sunrise Left  Standing Status:   Future    Number of Occurrences:   1    Standing Expiration Date:   11/10/2023    Order Specific Question:   Reason for Exam (SYMPTOM  OR DIAGNOSIS REQUIRED)    Answer:   eval knee pain    Order Specific Question:   Is patient pregnant?    Answer:   No    Order Specific Question:   Preferred imaging location?    Answer:   [facility]   Ambulatory referral to Physical Therapy    Referral Priority:   Routine    Referral Type:   Physical Medicine    Referral Reason:    Specialty Services Required    Requested Specialty:   Physical Therapy    Number of Visits Requested:   1   No orders of the defined types were placed in this encounter.    Discussed warning signs or symptoms. Please see discharge instructions. Patient expresses understanding.   The above documentation has been reviewed and is accurate and complete Kathy Steele, M.D.

## 2022-11-13 NOTE — Progress Notes (Signed)
Right knee x-ray looks normal to radiology

## 2022-11-13 NOTE — Progress Notes (Signed)
Left knee x-ray looks normal to radiology

## 2022-11-17 ENCOUNTER — Ambulatory Visit
Admission: EM | Admit: 2022-11-17 | Discharge: 2022-11-17 | Disposition: A | Discharge status: Home / Self Care | Payer: BC Managed Care – PPO | Attending: Nurse Practitioner | Admitting: Nurse Practitioner

## 2022-11-17 DIAGNOSIS — B349 Viral infection, unspecified: Secondary | ICD-10-CM | POA: Diagnosis not present

## 2022-11-17 DIAGNOSIS — Z1152 Encounter for screening for COVID-19: Secondary | ICD-10-CM | POA: Diagnosis not present

## 2022-11-17 DIAGNOSIS — J029 Acute pharyngitis, unspecified: Secondary | ICD-10-CM | POA: Insufficient documentation

## 2022-11-17 LAB — POCT RAPID STREP A (OFFICE): Rapid Strep A Screen: NEGATIVE

## 2022-11-17 LAB — POCT INFLUENZA A/B
Influenza A, POC: NEGATIVE
Influenza B, POC: NEGATIVE

## 2022-11-17 MED ORDER — PROMETHAZINE-DM 6.25-15 MG/5ML PO SYRP: 5.0000 mL | ORAL_SOLUTION | Freq: Four times a day (QID) | ORAL | 0 refills | Status: DC | PRN

## 2022-11-17 NOTE — ED Triage Notes (Signed)
Pt c/o of malaise starting Wednesday, and Thursday am HA, congestion, sore throat, ears throbbing, cough, nose burning Home remedies : Theraflu, mucinex, airborne, nyquil Pain: throat - 8/10

## 2022-11-17 NOTE — Discharge Instructions (Signed)
Your symptoms and exam are consistent for a viral illness.  Clinical contact you with results of your COVID testing is positive.  Please treat your symptoms with over the counter tylenol or ibuprofen, humidifier, and rest. Viral illnesses can last 7-14 days.  Meclizine DM as needed for cough.  Please note this medication can make you drowsy.  Do not drink alcohol or drive while on this medication.  Please follow up with your PCP if your symptoms are not improving. Please go to the ER for any worsening symptoms. This includes but is not limited to fever you can not control with tylenol or ibuprofen, you are not able to stay hydrated, you have shortness of breath or chest pain.  Thank you for choosing [facility] for your healthcare needs. I hope you feel better soon!

## 2022-11-17 NOTE — ED Provider Notes (Signed)
[facility]    CSN: [id number] Arrival date & time: 11/17/22  1011      History   Chief Complaint Chief Complaint  Patient presents with   Sore Throat    HPI Kathy Steele is a 47 y.o. female  presents for evaluation of URI symptoms for 2 days. Patient reports associated symptoms of cough, congestion, sore throat, headache, ear pain. Denies N/V/D, fevers, body aches, shortness of breath. Patient does not have a hx of asthma or smoking. No known sick contacts.  Pt has taken TheraFlu, Mucinex, Airborne, NyQuil OTC for symptoms. Pt has no other concerns at this time.    Sore Throat Associated symptoms include headaches.    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 No pertinent surgical history.  OB History     Gravida  1   Para  1   Term      Preterm      AB      Living         SAB      IAB      Ectopic      Multiple      Live Births               Home Medications    Prior to Admission medications   Medication Sig Start Date End Date Taking? Authorizing Provider  promethazine-dextromethorphan (PROMETHAZINE-DM) 6.25-15 MG/5ML syrup Take 5 mLs by mouth 4 (four) times daily as needed for cough. 11/17/22  Yes Melynda Ripple, NP  Aspirin-Acetaminophen-Caffeine (GOODYS EXTRA STRENGTH PO) Take 1 packet by  mouth daily as needed (headache).     [provider]  BLACK CURRANT SEED OIL PO Take by mouth.    [provider]  cetirizine (ZYRTEC) 10 MG tablet Take 10 mg by mouth daily.    [provider]  ELDERBERRY PO Take by mouth.    [provider]  ferrous sulfate 325 (65 FE) MG tablet Take 1 tablet (325 mg total) by mouth daily. 04/21/22   Martinique, Betty G, MD  fluticasone [facility]) 50 MCG/ACT nasal spray Place 2 sprays into both nostrils daily. 10/22/18   Enrique Sack, FNP  meloxicam (MOBIC) 15 MG tablet Take 1 tablet (15 mg total) by mouth daily. Patient not taking: Reported on 11/09/2022 07/17/22   Glennon Mac, DO  Multiple Vitamin (MULTIVITAMIN WITH MINERALS) TABS tablet Take 1 tablet by mouth daily.    [provider]  naproxen sodium (ALEVE) 220 MG tablet Take 220 mg by mouth as needed.    [provider]  spironolactone (ALDACTONE) 50 MG tablet Take 1 tablet by mouth 2 (two) times daily.    [provider]    Family History Family History  Problem Relation Age of Onset   Hypertension Mother    Diabetes Paternal Uncle  Diabetes Maternal Grandmother    Stroke Maternal Grandmother    Stroke Maternal Grandfather    Arthritis Other    Osteoarthritis Other     Social History Social History   Tobacco Use   Smoking status: Former    Types: Cigarettes    Quit date: 05/09/2013    Years since quitting: 9.5   Smokeless tobacco: Never   Tobacco comments:    Stopped 1 year ago  Vaping Use   Vaping Use: Never used  Substance Use Topics   Alcohol use: No    Alcohol/week: 0.0 standard drinks of alcohol    Comment: occasional   Drug use: No     Allergies   Doxycycline and Escitalopram oxalate   Review of Systems Review of Systems  HENT:  Positive for congestion, ear pain and sore throat.   Respiratory:  Positive for cough.   Neurological:  Positive for headaches.     Physical Exam Triage Vital  Signs ED Triage Vitals  Enc Vitals Group     BP 11/17/22 1041 117/80     Pulse Rate 11/17/22 1041 86     Resp 11/17/22 1041 18     Temp 11/17/22 1041 98.5 F (36.9 C)     Temp Source 11/17/22 1041 Oral     SpO2 11/17/22 1041 97 %     Weight --      Height --      Head Circumference --      Peak Flow --      Pain Score 11/17/22 1038 8     Pain Loc --      Pain Edu? --      Excl. in [place]? --    No data found.  Updated Vital Signs BP 117/80 (BP Location: Right Arm)   Pulse 86   Temp 98.5 F (36.9 C) (Oral)   Resp 18   LMP 11/14/2022 (Approximate)   SpO2 97%   Visual Acuity Right Eye Distance:   Left Eye Distance:   Bilateral Distance:    Right Eye Near:   Left Eye Near:    Bilateral Near:     Physical Exam Vitals and nursing note reviewed.  Constitutional:      General: She is not in acute distress.    Appearance: She is well-developed. She is not ill-appearing.  HENT:     Head: Normocephalic and atraumatic.     Right Ear: Tympanic membrane and ear canal normal.     Left Ear: Tympanic membrane and ear canal normal.     Nose: Congestion present.     Mouth/Throat:     Mouth: Mucous membranes are moist.     Pharynx: Oropharynx is clear. Uvula midline. Posterior oropharyngeal erythema present.     Tonsils: No tonsillar exudate or tonsillar abscesses.  Eyes:     Conjunctiva/sclera: Conjunctivae normal.     Pupils: Pupils are equal, round, and reactive to light.  Cardiovascular:     Rate and Rhythm: Normal rate and regular rhythm.     Heart sounds: Normal heart sounds.  Pulmonary:     Effort: Pulmonary effort is normal.     Breath sounds: Normal breath sounds.  Musculoskeletal:     Cervical back: Normal range of motion and neck supple.  Lymphadenopathy:     Cervical: No cervical adenopathy.  Skin:    General: Skin is warm and dry.  Neurological:     General: No focal deficit present.     Mental Status: She is  alert and oriented to person, place, and time.   Psychiatric:        Mood and Affect: Mood normal.        Behavior: Behavior normal.      UC Treatments / Results  Labs (all labs ordered are listed, but only abnormal results are displayed) Labs Reviewed  SARS CORONAVIRUS 2 (TAT 6-24 HRS)  POCT RAPID STREP A (OFFICE)  POCT INFLUENZA A/B    EKG   Radiology No results found.  Procedures Procedures (including critical care time)  Medications Ordered in UC Medications - No data to display  Initial Impression / Assessment and Plan / UC Course  I have reviewed the triage vital signs and the nursing notes.  Pertinent labs & imaging results that were available during my care of the patient were reviewed by me and considered in my medical decision making (see chart for details).     Negative rapid flu and strep in clinic COVID PCR and will contact if positive Discussed with patient viral illness and symptomatic treatment  Promethazine DM for cough.  Side effect profile reviewed Rest and fluids Follow up with PCP in 2-3 days for re-check  ER precautions reviewed and pt verbalized understanding  Final Clinical Impressions(s) / UC Diagnoses   Final diagnoses:  Sore throat  Viral illness     Discharge Instructions      Your symptoms and exam are consistent for a viral illness.  Clinical contact you with results of your COVID testing is positive.  Please treat your symptoms with over the counter tylenol or ibuprofen, humidifier, and rest. Viral illnesses can last 7-14 days.  Meclizine DM as needed for cough.  Please note this medication can make you drowsy.  Do not drink alcohol or drive while on this medication.  Please follow up with your PCP if your symptoms are not improving. Please go to the ER for any worsening symptoms. This includes but is not limited to fever you can not control with tylenol or ibuprofen, you are not able to stay hydrated, you have shortness of breath or chest pain.  Thank you for choosing [facility] for your healthcare needs. I hope you feel better soon!      ED Prescriptions     Medication Sig Dispense Auth. Provider   promethazine-dextromethorphan (PROMETHAZINE-DM) 6.25-15 MG/5ML syrup Take 5 mLs by mouth 4 (four) times daily as needed for cough. 118 mL Melynda Ripple, NP      PDMP not reviewed this encounter.   Melynda Ripple, NP 11/17/22 1124

## 2022-11-18 LAB — SARS CORONAVIRUS 2 (TAT 6-24 HRS): SARS Coronavirus 2: NEGATIVE

## 2022-11-27 DIAGNOSIS — M79671 Pain in right foot: Secondary | ICD-10-CM | POA: Diagnosis not present

## 2022-11-28 ENCOUNTER — Ambulatory Visit: Payer: BC Managed Care – PPO | Attending: Family Medicine | Admitting: Physical Therapy

## 2022-11-28 ENCOUNTER — Encounter: Payer: Self-pay | Admitting: Physical Therapy

## 2022-11-28 DIAGNOSIS — G8929 Other chronic pain: Secondary | ICD-10-CM | POA: Diagnosis not present

## 2022-11-28 DIAGNOSIS — M25561 Pain in right knee: Secondary | ICD-10-CM

## 2022-11-28 DIAGNOSIS — M25562 Pain in left knee: Secondary | ICD-10-CM | POA: Diagnosis not present

## 2022-11-28 NOTE — Therapy (Signed)
OUTPATIENT PHYSICAL THERAPY LOWER EXTREMITY EVALUATION   Patient Name: Kathy Steele MRN: [id number] DOB:11-29-1975, 47 y.o., female Today's Date: 11/28/2022  END OF SESSION:  PT End of Session - 11/28/22 1702     Visit Number 1    Date for PT Re-Evaluation 01/28/23    Authorization Type [employer]    PT Start Time 1700    PT Stop Time 1745    PT Time Calculation (min) 45 min    Activity Tolerance Patient tolerated treatment well    Behavior During Therapy WFL for tasks assessed/performed             Past Medical History:  Diagnosis Date   Headache(784.0)    History of chickenpox    History reviewed. No pertinent surgical history. 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 Martinique, MD  REFERRING PROVIDER: [name], MD  REFERRING DIAG: bilateral knee pain  THERAPY DIAG:  Acute pain of left knee  Acute pain of right knee  Rationale for Evaluation and Treatment: Rehabilitation  ONSET DATE: 11/09/22  SUBJECTIVE:   SUBJECTIVE STATEMENT: Patient reports that she started going to the gym in January, she reports that she started having knee pain, left > right, now she is reporting knee pain going up and down.    PERTINENT HISTORY: None for the knees PAIN:  Are you having pain? Yes: NPRS scale: 0/10 Pain location: anterior knees L> R Pain description: ache, burning Aggravating factors: elliptical, stairs, sitting long  periods pain up to 8/10  Relieving factors: rest, Tylenol  PRECAUTIONS: None  WEIGHT BEARING RESTRICTIONS: No  FALLS:  Has patient fallen in last 6 months? 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  PLOF: Independent and reports was going to the gym 3x/week, walks dog  PATIENT GOALS: have less pain, go up and down stairs without pain  NEXT MD VISIT: none scheduled  OBJECTIVE:   DIAGNOSTIC FINDINGS: mild DJD  PATIENT SURVEYS:  FOTO 49  COGNITION: Overall cognitive status: Within functional limits for tasks assessed     SENSATION: WFL  MUSCLE LENGTH: Very tight HS only 40 degrees SLR, very tight quads 8" heel from butt in prone, very tight calves, tight ITB, mild tightness int eh piriformis  PALPATION: Non tender, not warm to touch, no edema aprreciable  LOWER EXTREMITY ROM:  Active ROM Right eval Left eval  Hip flexion    Hip extension    Hip abduction    Hip adduction    Hip internal rotation    Hip external rotation    Knee flexion 115 115  Knee extension 0 0  Ankle dorsiflexion    Ankle plantarflexion    Ankle inversion    Ankle eversion     (Blank rows = not tested)  LOWER EXTREMITY MMT:  MMT Right  eval Left eval  Hip flexion 4+ 4+  Hip extension    Hip abduction 4 4  Hip adduction    Hip internal rotation    Hip external rotation    Knee flexion 4+ 4+  Knee extension 4 4  Ankle dorsiflexion    Ankle plantarflexion    Ankle inversion    Ankle eversion     (Blank rows = not tested)  LOWER EXTREMITY SPECIAL TESTS:  Mild lateral tracking patella  GAIT: Distance walked: 100 feet Assistive device utilized: None Level of assistance: Complete Independence Comments: reports no pain now, has pain with stairs and reports at times knees feel like they will give   TODAY'S  TREATMENT:                                                                                                                              DATE:     PATIENT EDUCATION:  Education details: POC/HEP Person educated: Patient Education method: Explanation, Demonstration, Tactile cues, Verbal cues, and Handouts Education comprehension: verbalized understanding  HOME EXERCISE PROGRAM: Access Code: [email or web address] URL: [email or web address] Date: 11/28/2022 Prepared by: Lum Babe  Exercises - Supine Hamstring Stretch with Doorway  - 1 x daily - 7 x weekly - 1 sets - 5 reps - 30 hold - Seated Hamstring Stretch with Chair  - 1 x daily - 7 x weekly - 1 sets - 5 reps - 30 hold - Gastroc Stretch on Wall  - 1 x daily - 7 x weekly - 1 sets - 5 reps - 30 hold - Standing Bilateral Gastroc Stretch with Step  - 1 x daily - 7 x weekly - 1 sets - 5 reps - 30 hold - Prone Quadriceps Stretch with Strap  - 1 x daily - 7 x weekly - 1 sets - 5 reps - 30 hold - Supine Quadriceps Stretch with Strap on Table  - 1 x daily - 7 x weekly - 1 sets - 5 reps - 30 hold - Supine Piriformis Stretch Pulling Heel to Hip  - 1 x daily - 7 x weekly - 1 sets - 5 reps - 30 hold  ASSESSMENT:  CLINICAL IMPRESSION: Patient is a 47 y.o. female who was seen today for physical therapy evaluation and treatment for bilateral knee pain left > right.  Pain started after joining gym at the first of the year, she reports that really started hurting after using an elliptical.  She is extremely tight in the HS and calves and quads, mild lateral tracking patella   OBJECTIVE IMPAIRMENTS: Abnormal gait, cardiopulmonary status limiting activity, decreased activity tolerance, decreased balance, decreased mobility, difficulty walking, decreased ROM, decreased strength, increased fascial restrictions, increased muscle spasms, impaired flexibility, improper body mechanics, and pain.   REHAB POTENTIAL: Good  CLINICAL DECISION MAKING:  Stable/uncomplicated  EVALUATION COMPLEXITY: Low   GOALS: Goals reviewed with patient? Yes  SHORT TERM GOALS: Target date: 12/07/22 Independent with initial HEP Goal  status: INITIAL   LONG TERM GOALS: Target date: 02/28/23  Independent with advanced HEP/gym Goal status: INITIAL  2.  Report no difficulty going up and down stairs Goal status: INITIAL  3.  Report pain overall decreased 50% Goal status: INITIAL  4.  Increase SLR to 70 degrees Goal status: INITIAL PLAN:  PT FREQUENCY: 1-2x/week  PT DURATION: 12 weeks  PLANNED INTERVENTIONS: Therapeutic exercises, Therapeutic activity, Neuromuscular re-education, Balance training, Gait training, Patient/Family education, Self Care, Joint mobilization, Joint manipulation, Stair training, Dry Needling, Electrical stimulation, Cryotherapy, Moist heat, Taping, Vasopneumatic device, Ultrasound, and Manual therapy  PLAN FOR NEXT SESSION: assure stretching HEP, go into gym and look at her posture and mechanics on some machines answer quesitons that she has about going to a gym, advise to go slow and take notes    Sumner Boast, PT 11/28/2022, 5:03 PM

## 2022-12-01 IMAGING — MG DIGITAL DIAGNOSTIC BILAT W/ TOMO W/ CAD
8 series · 8 of 24 positions shown · non-contrast
Comparison: Previous exam(s).

CLINICAL DATA: Follow-up for a likely benign right breast mass.

EXAM:
DIGITAL DIAGNOSTIC BILATERAL MAMMOGRAM WITH TOMOSYNTHESIS AND CAD;
ULTRASOUND RIGHT BREAST LIMITED
TECHNIQUE: Bilateral digital diagnostic mammography and breast tomosynthesis
was performed. The images were evaluated with computer-aided
detection.; Targeted ultrasound examination of the right breast was
performed

[L CC synth-2D]
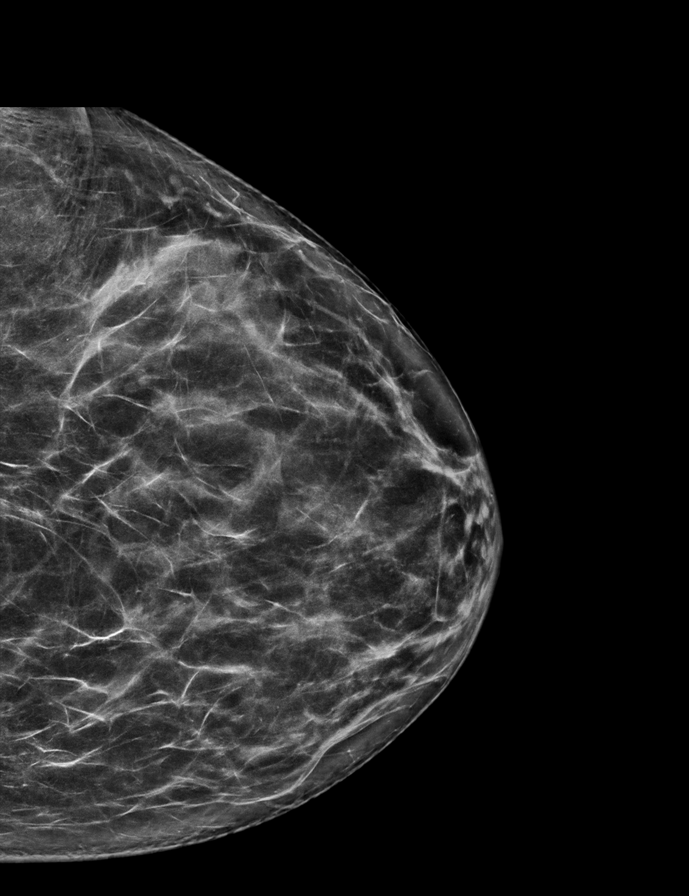

[R MLO synth-2D]
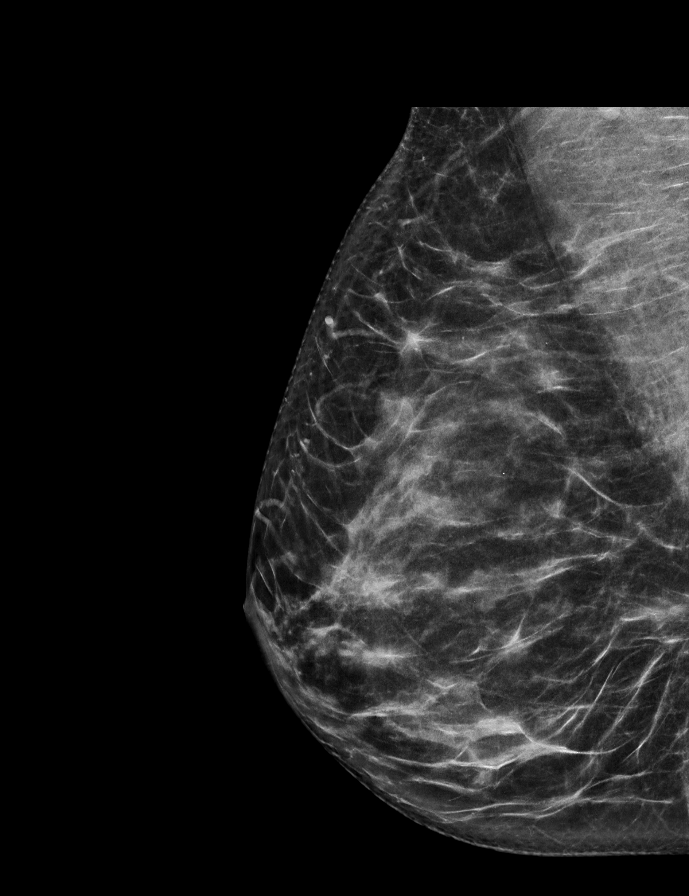

[L MLO synth-2D]
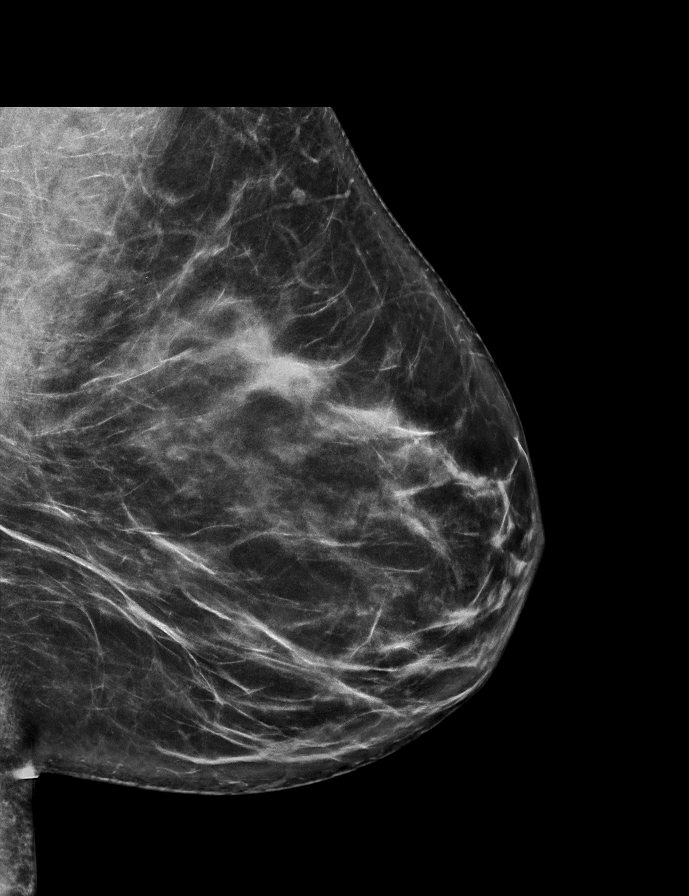

[R CC synth-2D]
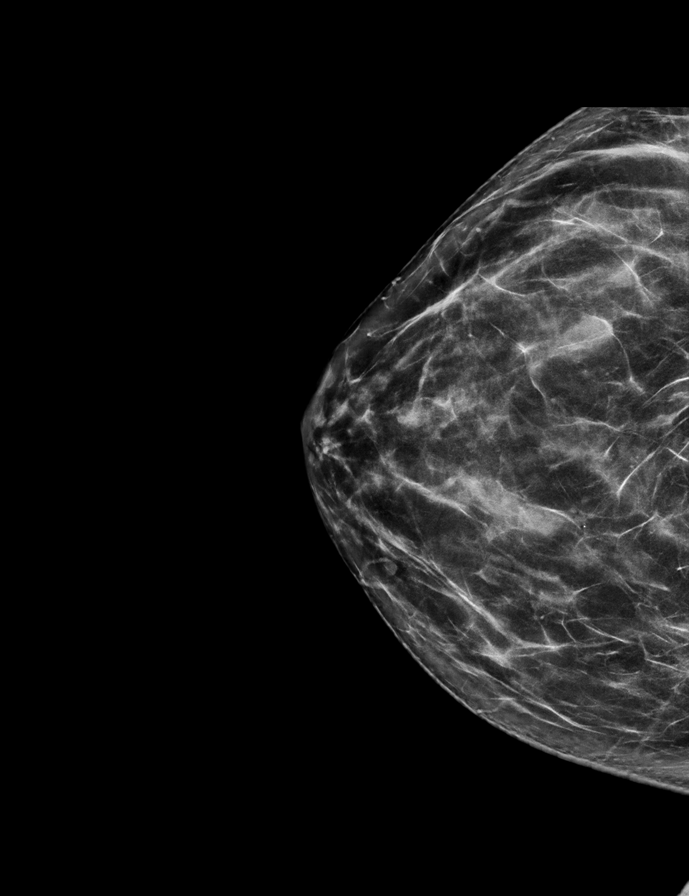

[L CC tomo · tomo slice 39/76.0]
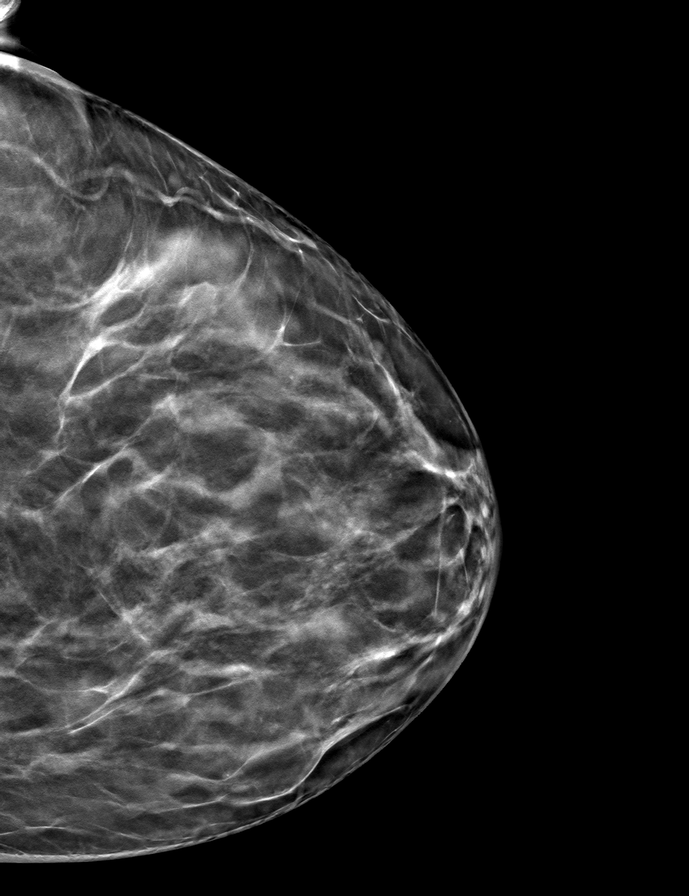

[R MLO tomo · tomo slice 37/74.0]
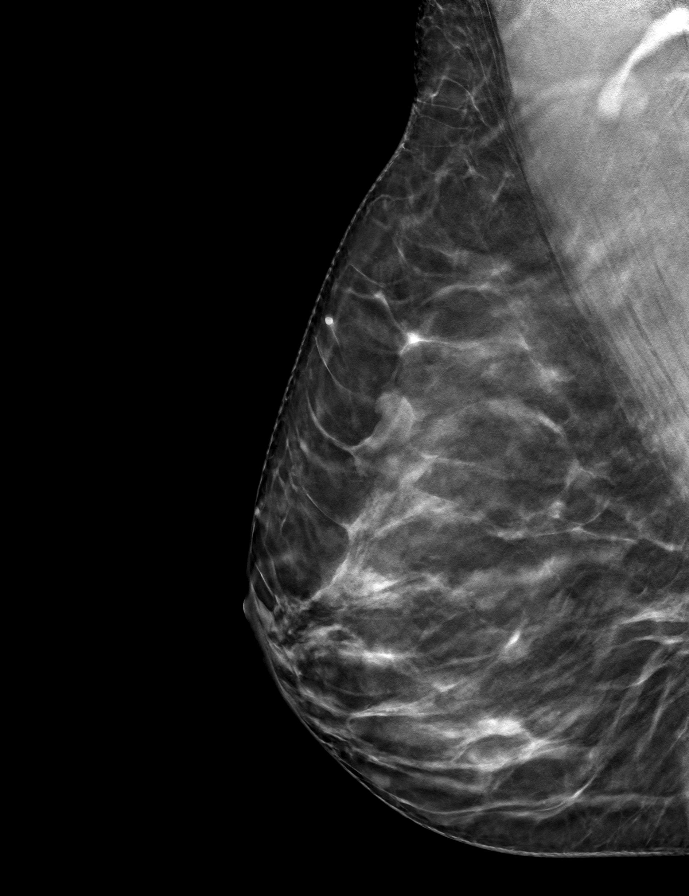

[R CC tomo · tomo slice 35/70.0]
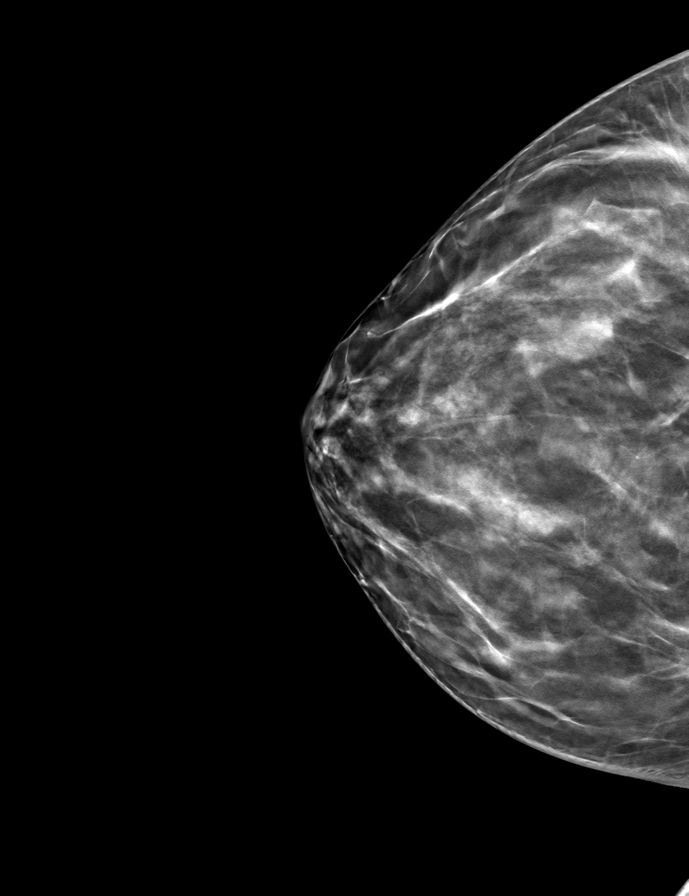

[L MLO tomo · tomo slice 41/81.0]
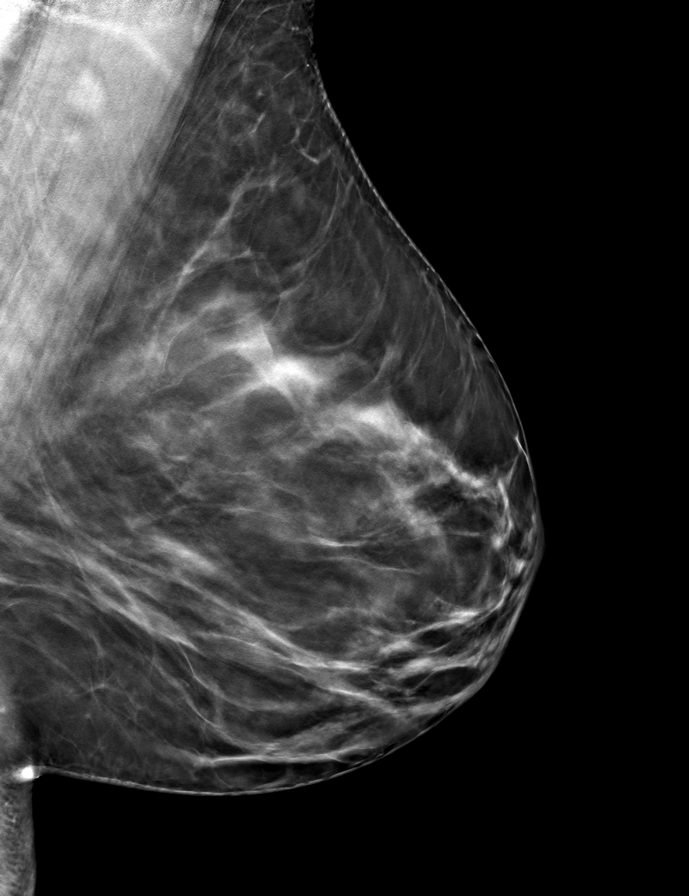

[8 of 24 positions shown; findings below may reference images not displayed]

ACR Breast Density Category c: The breast tissue is heterogeneously
dense, which may obscure small masses.
FINDINGS: A few scattered oval circumscribed cysts are seen in both breasts.
The larger cyst in the inferior right breast, likely corresponding
with the cyst being followed on the prior ultrasounds, is no longer
visualized. No suspicious calcifications, masses or areas of
distortion are seen in the bilateral breasts.

Ultrasound targeted to the inferior right breast demonstrates a few
scattered benign-appearing cysts. No suspicious masses or areas of
shadowing are identified.
IMPRESSION: 1. Resolution of the cyst previously seen in the inferior right
breast. Scattered areas of small cysts are seen in the inferior
right breast sonographically.

2.  No mammographic evidence of malignancy in the bilateral breasts.

RECOMMENDATION:
Screening mammogram in one year.(Code:[id number])

I have discussed the findings and recommendations with the patient.
If applicable, a reminder letter will be sent to the patient
regarding the next appointment.

BI-RADS CATEGORY  2: Benign.

## 2022-12-11 ENCOUNTER — Ambulatory Visit: Payer: BC Managed Care – PPO | Admitting: Physical Therapy

## 2023-01-03 ENCOUNTER — Ambulatory Visit: Payer: BC Managed Care – PPO | Attending: Family Medicine | Admitting: Physical Therapy

## 2023-01-04 ENCOUNTER — Ambulatory Visit: Payer: BC Managed Care – PPO | Admitting: Family Medicine

## 2023-01-08 ENCOUNTER — Encounter: Payer: Self-pay | Admitting: *Deleted

## 2023-01-12 DIAGNOSIS — Z113 Encounter for screening for infections with a predominantly sexual mode of transmission: Secondary | ICD-10-CM | POA: Diagnosis not present

## 2023-02-03 IMAGING — DX DG CHEST 2V
2 series · 2 of 2 positions shown · non-contrast
Comparison: 12/31/2018

CLINICAL DATA: Preop evaluation

EXAM:
CHEST - 2 VIEW

[chest pa]
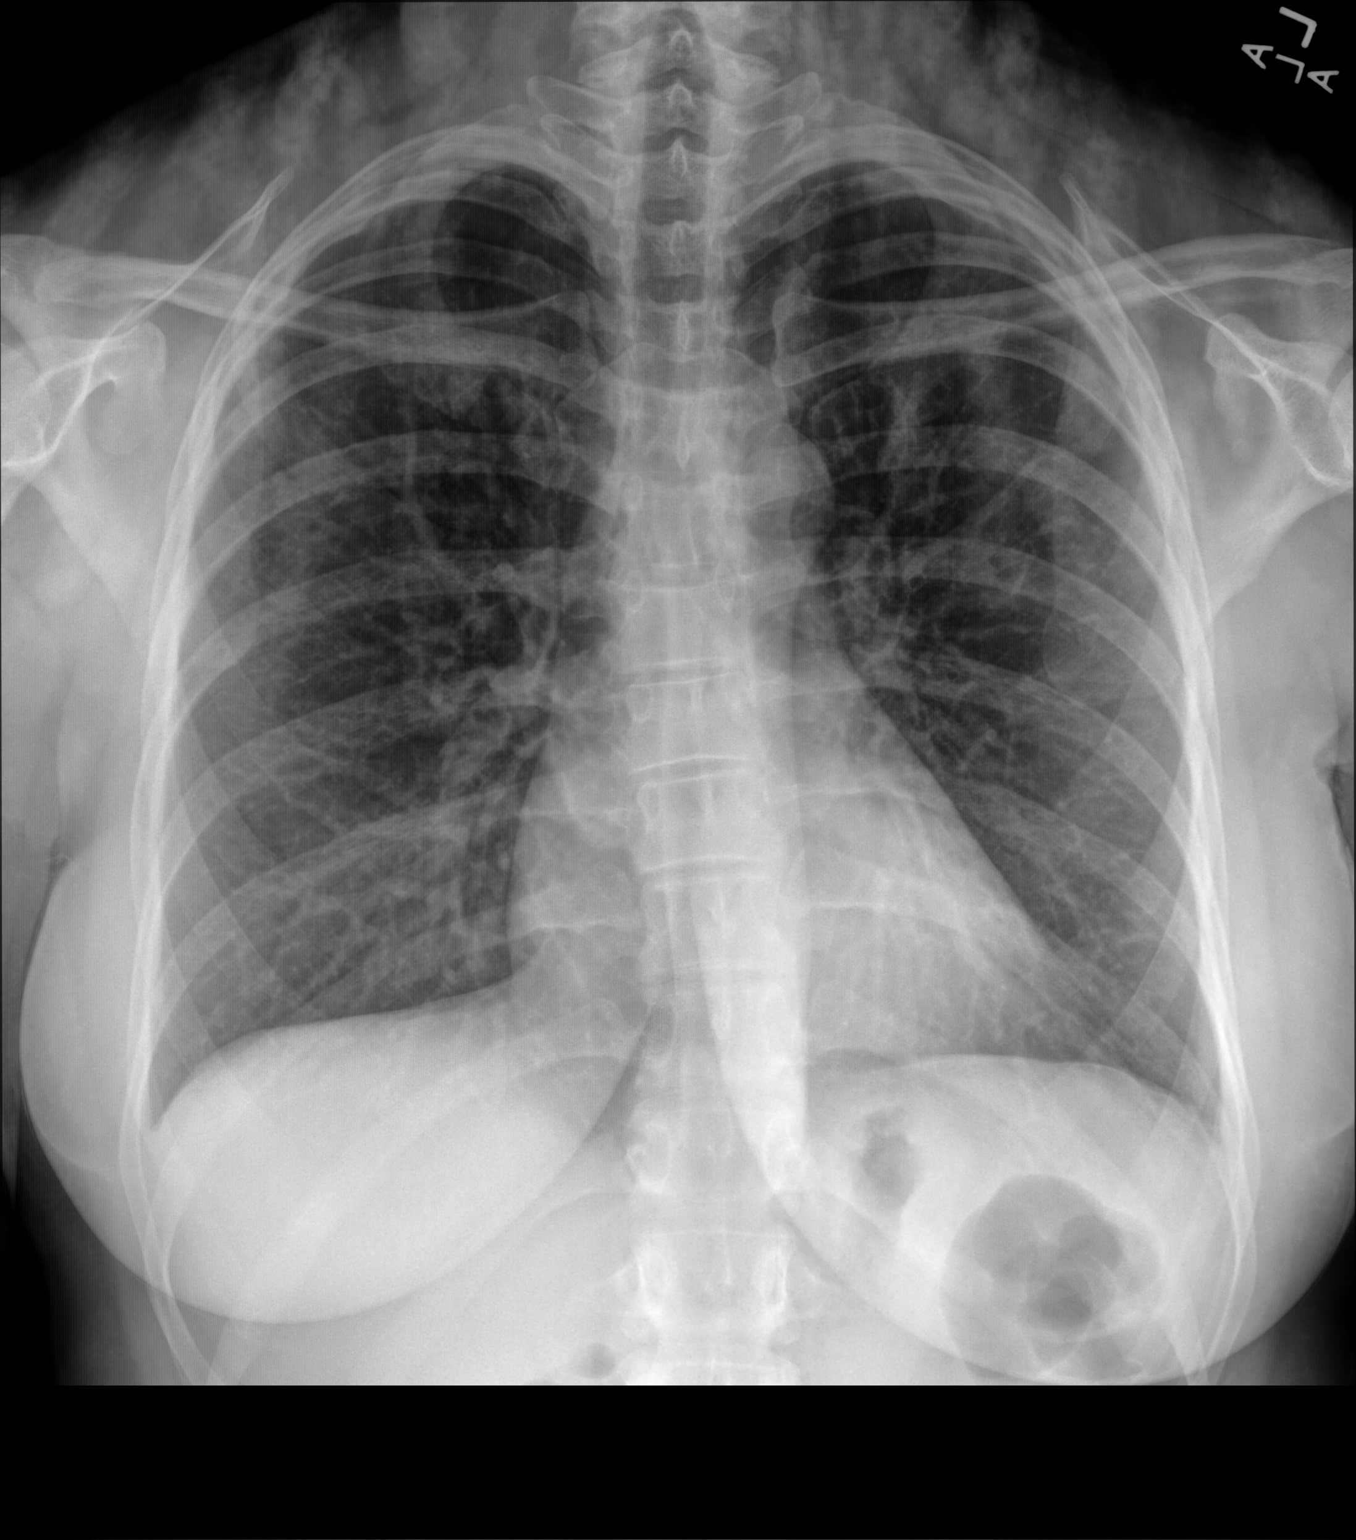

[chest lat]
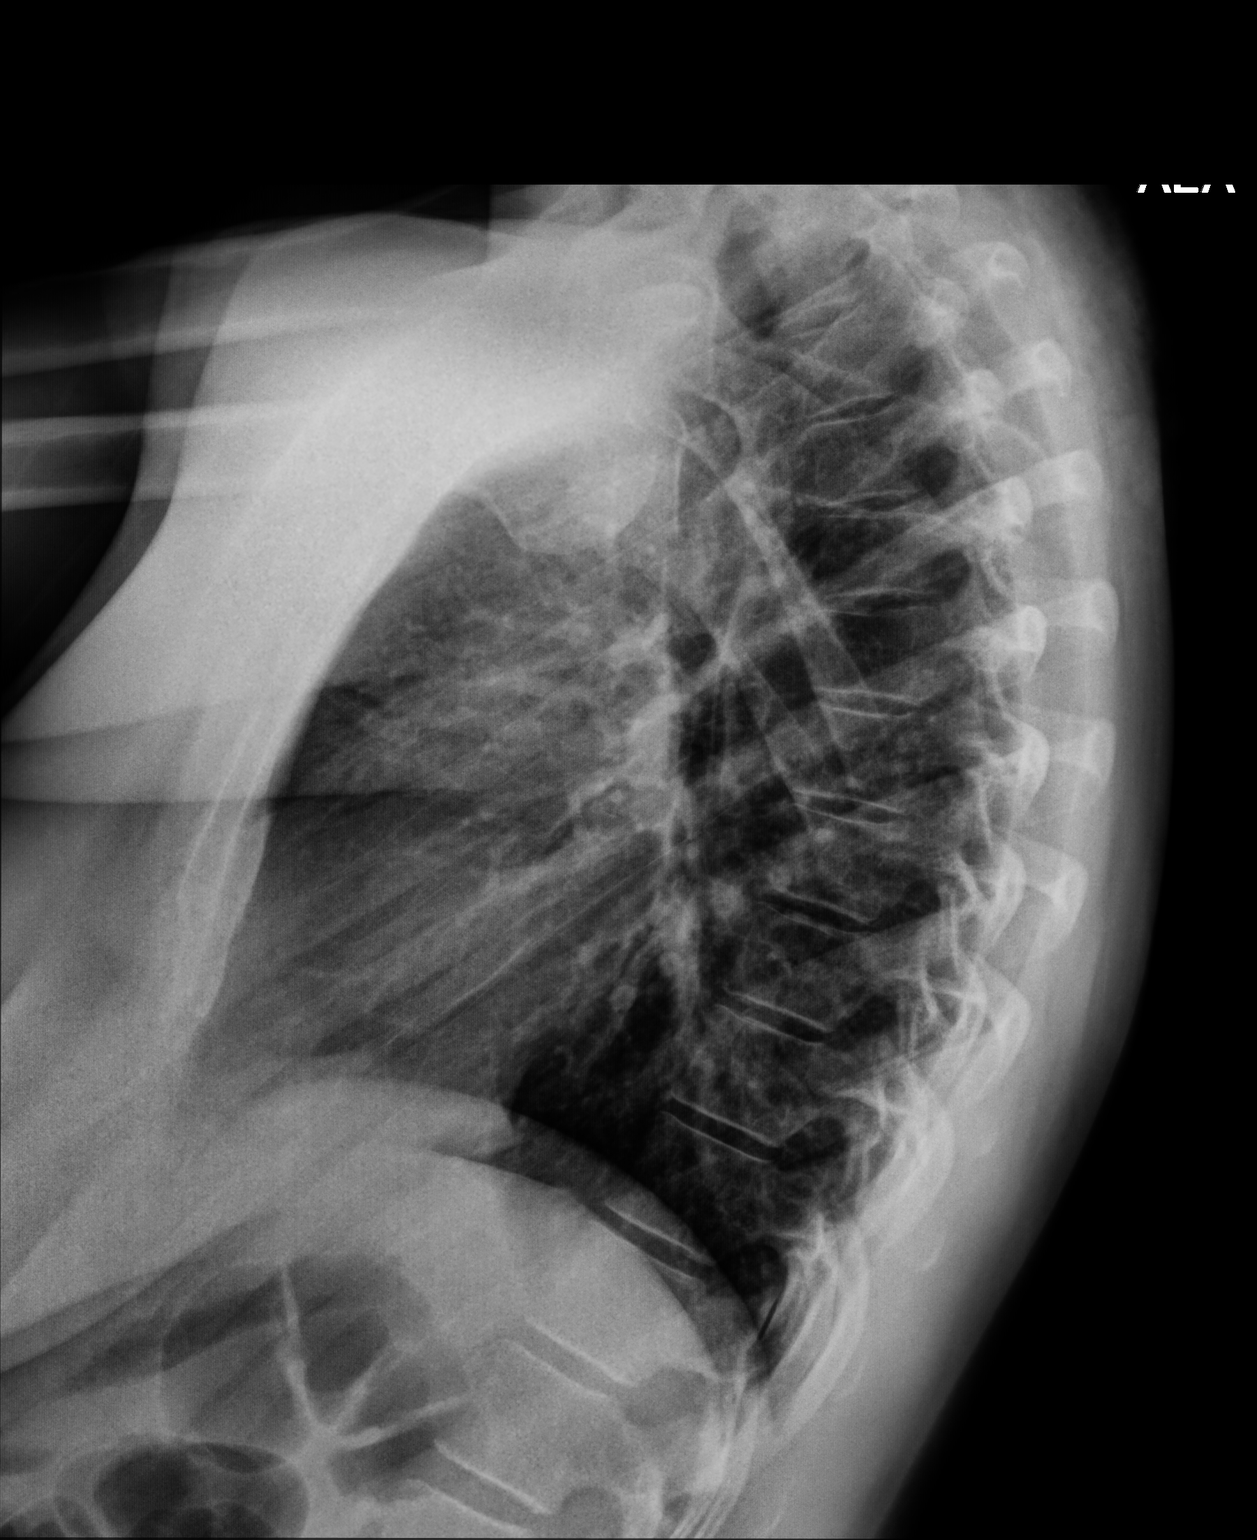

[2 of 2 positions shown; findings below may reference images not displayed]

FINDINGS: Cardiac size is within normal limits. Lung fields are clear of any
infiltrates or pulmonary edema. In the PA view there are linear
radiopacities overlying the upper lung fields, most likely hair
braids. This finding could not be seen in the lateral view. There is
no pleural effusion or pneumothorax.
IMPRESSION: There are no signs of pulmonary edema or definite focal pneumonia.
Faint linear densities overlying the upper lung fields in the PA
view most likely is an artifact caused by hair braids.

## 2023-02-08 DIAGNOSIS — L700 Acne vulgaris: Secondary | ICD-10-CM | POA: Diagnosis not present

## 2023-02-08 DIAGNOSIS — L738 Other specified follicular disorders: Secondary | ICD-10-CM | POA: Diagnosis not present

## 2023-02-20 DIAGNOSIS — A599 Trichomoniasis, unspecified: Secondary | ICD-10-CM | POA: Diagnosis not present

## 2023-02-20 DIAGNOSIS — A749 Chlamydial infection, unspecified: Secondary | ICD-10-CM | POA: Diagnosis not present

## 2023-02-21 DIAGNOSIS — R1012 Left upper quadrant pain: Secondary | ICD-10-CM | POA: Diagnosis not present

## 2023-02-21 DIAGNOSIS — D509 Iron deficiency anemia, unspecified: Secondary | ICD-10-CM | POA: Diagnosis not present

## 2023-02-21 DIAGNOSIS — K6289 Other specified diseases of anus and rectum: Secondary | ICD-10-CM | POA: Diagnosis not present

## 2023-03-24 DIAGNOSIS — Z113 Encounter for screening for infections with a predominantly sexual mode of transmission: Secondary | ICD-10-CM | POA: Diagnosis not present

## 2023-03-24 DIAGNOSIS — R369 Urethral discharge, unspecified: Secondary | ICD-10-CM | POA: Diagnosis not present

## 2023-04-23 NOTE — Progress Notes (Unsigned)
HPI: Kathy Steele is a 47 y.o. female, who is here today for her routine physical.  Last CPE: 08/15/21  Regular exercise 3 or more time per week: *** Following a healthy diet: ***  Chronic medical problems: Migraine ,HLD,dysmenorrhea,and anxiety. She follows with dermatologist regularly.  Immunization History  Administered Date(s) Administered   Influenza Split 08/31/2011, 09/16/2012, 07/25/2013   Influenza Whole 07/06/2010   Influenza,inj,Quad PF,6+ Mos 07/30/2018, 08/04/2019, 08/11/2020, 08/15/2021   PFIZER(Purple Top)SARS-COV-2 Vaccination 12/31/2019, 02/18/2020   Td 07/06/2010   Tdap 08/11/2020   Health Maintenance  Topic Date Due   COVID-19 Vaccine (3 - Pfizer risk series) 03/17/2020   PAP SMEAR-Modifier  09/03/2022   INFLUENZA VACCINE  04/26/2023   DTaP/Tdap/Td (3 - Td or Tdap) 08/11/2030   Hepatitis C Screening  Completed   HIV Screening  Completed   HPV VACCINES  Aged Out   HLD: She is on non pharmacologic treatment.  Lab Results  Component Value Date   CHOL 232 (H) 08/15/2021   HDL 74.80 08/15/2021   LDLCALC 145 (H) 08/15/2021   LDLDIRECT 131.6 09/09/2013   TRIG 62.0 08/15/2021   CHOLHDL 3 08/15/2021     Review of Systems  Current Outpatient Medications on File Prior to Visit  Medication Sig Dispense Refill   Aspirin-Acetaminophen-Caffeine (GOODYS EXTRA STRENGTH PO) Take 1 packet by mouth daily as needed (headache).      BLACK CURRANT SEED OIL PO Take by mouth.     cetirizine (ZYRTEC) 10 MG tablet Take 10 mg by mouth daily.     ELDERBERRY PO Take by mouth.     ferrous sulfate 325 (65 FE) MG tablet Take 1 tablet (325 mg total) by mouth daily. 90 tablet 1   fluticasone (FLONASE) 50 MCG/ACT nasal spray Place 2 sprays into both nostrils daily. 16 g 0   meloxicam (MOBIC) 15 MG tablet Take 1 tablet (15 mg total) by mouth daily. (Patient not taking: Reported on 11/09/2022) 30 tablet 0   Multiple Vitamin (MULTIVITAMIN WITH MINERALS) TABS tablet Take 1  tablet by mouth daily.     naproxen sodium (ALEVE) 220 MG tablet Take 220 mg by mouth as needed.     promethazine-dextromethorphan (PROMETHAZINE-DM) 6.25-15 MG/5ML syrup Take 5 mLs by mouth 4 (four) times daily as needed for cough. 118 mL 0   spironolactone (ALDACTONE) 50 MG tablet Take 1 tablet by mouth 2 (two) times daily.     No current facility-administered medications on file prior to visit.    Past Medical History:  Diagnosis Date   Headache(784.0)    History of chickenpox     No past surgical history on file.  Allergies  Allergen Reactions   Doxycycline Other (See Comments)   Escitalopram Oxalate     REACTION: Nausea, Ha and felt spacy    Family History  Problem Relation Age of Onset   Hypertension Mother    Diabetes Paternal Uncle    Diabetes Maternal Grandmother    Stroke Maternal Grandmother    Stroke Maternal Grandfather    Arthritis Other    Osteoarthritis Other     Social History   Socioeconomic History   Marital status: Married    Spouse name: Not on file   Number of children: Not on file   Years of education: Not on file   Highest education level: Not on file  Occupational History   Not on file  Tobacco Use   Smoking status: Former    Current packs/day: 0.00  Types: Cigarettes    Quit date: 05/09/2013    Years since quitting: 9.9   Smokeless tobacco: Never   Tobacco comments:    Stopped 1 year ago  Vaping Use   Vaping status: Never Used  Substance and Sexual Activity   Alcohol use: No    Alcohol/week: 0.0 standard drinks of alcohol    Comment: occasional   Drug use: No   Sexual activity: Not on file  Other Topics Concern   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 There is no height or weight on file to calculate BMI.  Wt Readings from Last 3 Encounters:  11/09/22 157 lb (71.2 kg)  08/10/22 154 lb (69.9 kg)  07/17/22 148 lb (67.1 kg)    Physical Exam  ASSESSMENT AND PLAN: Kathy Steele was here today annual physical examination.  No orders of the defined types were placed in this encounter.   There are no diagnoses linked to this encounter.  There are no diagnoses linked to this encounter.  No follow-ups on file.  Chase Arnall G. [place], MD  [facility]. [place] office.

## 2023-04-24 ENCOUNTER — Encounter: Payer: Self-pay | Admitting: Family Medicine

## 2023-04-24 ENCOUNTER — Ambulatory Visit (INDEPENDENT_AMBULATORY_CARE_PROVIDER_SITE_OTHER): Payer: BC Managed Care – PPO | Admitting: Family Medicine

## 2023-04-24 VITALS — BP 100/70 | HR 73 | Temp 98.4°F | Resp 12 | Ht 64.0 in | Wt 162.0 lb

## 2023-04-24 DIAGNOSIS — Z13228 Encounter for screening for other metabolic disorders: Secondary | ICD-10-CM

## 2023-04-24 DIAGNOSIS — D500 Iron deficiency anemia secondary to blood loss (chronic): Secondary | ICD-10-CM | POA: Insufficient documentation

## 2023-04-24 DIAGNOSIS — Z1329 Encounter for screening for other suspected endocrine disorder: Secondary | ICD-10-CM | POA: Diagnosis not present

## 2023-04-24 DIAGNOSIS — E785 Hyperlipidemia, unspecified: Secondary | ICD-10-CM

## 2023-04-24 DIAGNOSIS — Z130 Encounter for screening for diseases of the blood and blood-forming organs and certain disorders involving the immune mechanism: Secondary | ICD-10-CM

## 2023-04-24 DIAGNOSIS — Z0000 Encounter for general adult medical examination without abnormal findings: Secondary | ICD-10-CM

## 2023-04-24 DIAGNOSIS — N938 Other specified abnormal uterine and vaginal bleeding: Secondary | ICD-10-CM

## 2023-04-24 LAB — COMPREHENSIVE METABOLIC PANEL
ALT: 12 U/L (ref 0–35)
AST: 15 U/L (ref 0–37)
Albumin: 4.1 g/dL (ref 3.5–5.2)
Alkaline Phosphatase: 52 U/L (ref 39–117)
BUN: 14 mg/dL (ref 6–23)
CO2: 24 mEq/L (ref 19–32)
Calcium: 9.3 mg/dL (ref 8.4–10.5)
Chloride: 104 mEq/L (ref 96–112)
Creatinine, Ser: 0.64 mg/dL (ref 0.40–1.20)
GFR: 107.27 mL/min (ref >60.00)
Glucose, Bld: 94 mg/dL (ref 70–99)
Potassium: 4 mEq/L (ref 3.5–5.1)
Sodium: 136 mEq/L (ref 135–145)
Total Bilirubin: 0.3 mg/dL (ref 0.2–1.2)
Total Protein: 7.6 g/dL (ref 6.0–8.3)

## 2023-04-24 LAB — CBC
HCT: 36.7 % (ref 36.0–46.0)
Hemoglobin: 11.8 g/dL — ABNORMAL LOW (ref 12.0–15.0)
MCHC: 32.3 g/dL (ref 30.0–36.0)
MCV: 93.1 fl (ref 78.0–100.0)
Platelets: 375 10*3/uL (ref 150.0–400.0)
RBC: 3.94 Mil/uL (ref 3.87–5.11)
RDW: 13.2 % (ref 11.5–15.5)
WBC: 6.4 10*3/uL (ref 4.0–10.5)

## 2023-04-24 LAB — LIPID PANEL
Cholesterol: 228 mg/dL — ABNORMAL HIGH (ref 0–200)
HDL: 66.1 mg/dL (ref >39.00)
LDL Cholesterol: 146 mg/dL — ABNORMAL HIGH (ref 0–99)
NonHDL: 161.81
Total CHOL/HDL Ratio: 3
Triglycerides: 80 mg/dL (ref 0.0–149.0)
VLDL: 16 mg/dL (ref 0.0–40.0)

## 2023-04-24 NOTE — Assessment & Plan Note (Addendum)
We discussed the importance of regular physical activity and healthy diet for prevention of chronic illness and/or complications. Preventive guidelines reviewed. Vaccination up-to-date. Continue her female preventive care with her gynecologist. She was instructed to call and arrange her mammogram, last one in 09/2021. Next CPE in a year.

## 2023-04-24 NOTE — Assessment & Plan Note (Signed)
Continue nonpharmacologic treatment. Further recommendations will be given according to 10 years CVD risk score and lipid panel numbers. 

## 2023-04-24 NOTE — Assessment & Plan Note (Signed)
Attributed to heavy menstrual flow. Continue ferrous sulfate 325 mg daily. Further recommendation will be given according to CBC results. Pending colonoscopy and EGD in 05/2023.

## 2023-04-24 NOTE — Patient Instructions (Addendum)
A few things to remember from today's visit:  Routine general medical examination at a health care facility  Hyperlipidemia, unspecified hyperlipidemia type - Plan: Comprehensive metabolic panel, Lipid panel  Screening for endocrine, metabolic and immunity disorder - Plan: Comprehensive metabolic panel  Iron deficiency anemia due to chronic blood loss - Plan: CBC  DUB (dysfunctional uterine bleeding) Arrange appt with your gynecologist.  Call to arrange your mammogram. Do not use My Chart to request refills or for acute issues that need immediate attention. If you send a my chart message, it may take a few days to be addressed, specially if I am not in the office.  Please be sure medication list is accurate. If a new problem present, please set up appointment sooner than planned today.  Health Maintenance, Female Adopting a healthy lifestyle and getting preventive care are important in promoting health and wellness. Ask your health care provider about: The right schedule for you to have regular tests and exams. Things you can do on your own to prevent diseases and keep yourself healthy. What should I know about diet, weight, and exercise? Eat a healthy diet  Eat a diet that includes plenty of vegetables, fruits, low-fat dairy products, and lean protein. Do not eat a lot of foods that are high in solid fats, added sugars, or sodium. Maintain a healthy weight Body mass index (BMI) is used to identify weight problems. It estimates body fat based on height and weight. Your health care provider can help determine your BMI and help you achieve or maintain a healthy weight. Get regular exercise Get regular exercise. This is one of the most important things you can do for your health. Most adults should: Exercise for at least 150 minutes each week. The exercise should increase your heart rate and make you sweat (moderate-intensity exercise). Do strengthening exercises at least twice a week.  This is in addition to the moderate-intensity exercise. Spend less time sitting. Even light physical activity can be beneficial. Watch cholesterol and blood lipids Have your blood tested for lipids and cholesterol at 45 years of age, then have this test every 5 years. Have your cholesterol levels checked more often if: Your lipid or cholesterol levels are high. You are older than 45 years of age. You are at high risk for heart disease. What should I know about cancer screening? Depending on your health history and family history, you may need to have cancer screening at various ages. This may include screening for: Breast cancer. Cervical cancer. Colorectal cancer. Skin cancer. Lung cancer. What should I know about heart disease, diabetes, and high blood pressure? Blood pressure and heart disease High blood pressure causes heart disease and increases the risk of stroke. This is more likely to develop in people who have high blood pressure readings or are overweight. Have your blood pressure checked: Every 3-5 years if you are 26-68 years of age. Every year if you are 33 years old or older. Diabetes Have regular diabetes screenings. This checks your fasting blood sugar level. Have the screening done: Once every three years after age 41 if you are at a normal weight and have a low risk for diabetes. More often and at a younger age if you are overweight or have a high risk for diabetes. What should I know about preventing infection? Hepatitis B If you have a higher risk for hepatitis B, you should be screened for this virus. Talk with your health care provider to find out if you are  at risk for hepatitis B infection. Hepatitis C Testing is recommended for: Everyone born from [birth year] through 1963. Anyone with known risk factors for hepatitis C. Sexually transmitted infections (STIs) Get screened for STIs, including gonorrhea and chlamydia, if: You are sexually active and are younger than  45 years of age. You are older than 45 years of age and your health care provider tells you that you are at risk for this type of infection. Your sexual activity has changed since you were last screened, and you are at increased risk for chlamydia or gonorrhea. Ask your health care provider if you are at risk. Ask your health care provider about whether you are at high risk for HIV. Your health care provider may recommend a prescription medicine to help prevent HIV infection. If you choose to take medicine to prevent HIV, you should first get tested for HIV. You should then be tested every 3 months for as long as you are taking the medicine. Pregnancy If you are about to stop having your period (premenopausal) and you may become pregnant, seek counseling before you get pregnant. Take 400 to 800 micrograms (mcg) of folic acid every day if you become pregnant. Ask for birth control (contraception) if you want to prevent pregnancy. Osteoporosis and menopause Osteoporosis is a disease in which the bones lose minerals and strength with aging. This can result in bone fractures. If you are 74 years old or older, or if you are at risk for osteoporosis and fractures, ask your health care provider if you should: Be screened for bone loss. Take a calcium or vitamin D supplement to lower your risk of fractures. Be given hormone replacement therapy (HRT) to treat symptoms of menopause. Follow these instructions at home: Alcohol use Do not drink alcohol if: Your health care provider tells you not to drink. You are pregnant, may be pregnant, or are planning to become pregnant. If you drink alcohol: Limit how much you have to: 0-1 drink a day. Know how much alcohol is in your drink. In the [place] one drink equals one 12 oz bottle of beer (355 mL), one 5 oz glass of wine (148 mL), or one 1 oz glass of hard liquor (44 mL). Lifestyle Do not use any products that contain nicotine or tobacco. These products  include cigarettes, chewing tobacco, and vaping devices, such as e-cigarettes. If you need help quitting, ask your health care provider. Do not use street drugs. Do not share needles. Ask your health care provider for help if you need support or information about quitting drugs. General instructions Schedule regular health, dental, and eye exams. Stay current with your vaccines. Tell your health care provider if: You often feel depressed. You have ever been abused or do not feel safe at home. Summary Adopting a healthy lifestyle and getting preventive care are important in promoting health and wellness. Follow your health care provider's instructions about healthy diet, exercising, and getting tested or screened for diseases. Follow your health care provider's instructions on monitoring your cholesterol and blood pressure. This information is not intended to replace advice given to you by your health care provider. Make sure you discuss any questions you have with your health care provider. Document Revised: 01/31/2021 Document Reviewed: 01/31/2021 Elsevier Patient Education  2024 [employer].

## 2023-04-26 ENCOUNTER — Other Ambulatory Visit: Payer: Self-pay | Admitting: Family Medicine

## 2023-04-26 DIAGNOSIS — Z1231 Encounter for screening mammogram for malignant neoplasm of breast: Secondary | ICD-10-CM

## 2023-05-08 ENCOUNTER — Ambulatory Visit
Admission: RE | Admit: 2023-05-08 | Discharge: 2023-05-08 | Disposition: A | Discharge status: Home / Self Care | Payer: BC Managed Care – PPO | Source: Ambulatory Visit | Attending: Family Medicine | Admitting: Family Medicine

## 2023-05-08 DIAGNOSIS — Z1231 Encounter for screening mammogram for malignant neoplasm of breast: Secondary | ICD-10-CM

## 2023-05-14 DIAGNOSIS — L811 Chloasma: Secondary | ICD-10-CM | POA: Diagnosis not present

## 2023-05-14 DIAGNOSIS — L700 Acne vulgaris: Secondary | ICD-10-CM | POA: Diagnosis not present

## 2023-05-15 DIAGNOSIS — N939 Abnormal uterine and vaginal bleeding, unspecified: Secondary | ICD-10-CM | POA: Diagnosis not present

## 2023-06-15 DIAGNOSIS — N926 Irregular menstruation, unspecified: Secondary | ICD-10-CM | POA: Diagnosis not present

## 2023-06-27 DIAGNOSIS — Z0000 Encounter for general adult medical examination without abnormal findings: Secondary | ICD-10-CM | POA: Diagnosis not present

## 2023-06-27 DIAGNOSIS — R030 Elevated blood-pressure reading, without diagnosis of hypertension: Secondary | ICD-10-CM | POA: Diagnosis not present

## 2023-06-27 DIAGNOSIS — F4321 Adjustment disorder with depressed mood: Secondary | ICD-10-CM | POA: Diagnosis not present

## 2023-10-17 DIAGNOSIS — H04123 Dry eye syndrome of bilateral lacrimal glands: Secondary | ICD-10-CM | POA: Diagnosis not present

## 2023-10-17 DIAGNOSIS — Z973 Presence of spectacles and contact lenses: Secondary | ICD-10-CM | POA: Diagnosis not present

## 2023-10-22 DIAGNOSIS — K449 Diaphragmatic hernia without obstruction or gangrene: Secondary | ICD-10-CM | POA: Diagnosis not present

## 2023-10-22 DIAGNOSIS — K6289 Other specified diseases of anus and rectum: Secondary | ICD-10-CM | POA: Diagnosis not present

## 2023-10-22 DIAGNOSIS — Z1211 Encounter for screening for malignant neoplasm of colon: Secondary | ICD-10-CM | POA: Diagnosis not present

## 2023-10-22 DIAGNOSIS — D509 Iron deficiency anemia, unspecified: Secondary | ICD-10-CM | POA: Diagnosis not present

## 2023-10-22 DIAGNOSIS — K2970 Gastritis, unspecified, without bleeding: Secondary | ICD-10-CM | POA: Diagnosis not present

## 2023-10-22 DIAGNOSIS — R1012 Left upper quadrant pain: Secondary | ICD-10-CM | POA: Diagnosis not present

## 2023-10-22 DIAGNOSIS — K2950 Unspecified chronic gastritis without bleeding: Secondary | ICD-10-CM | POA: Diagnosis not present

## 2023-10-26 ENCOUNTER — Other Ambulatory Visit: Payer: Self-pay | Admitting: Family Medicine

## 2023-10-26 DIAGNOSIS — H04123 Dry eye syndrome of bilateral lacrimal glands: Secondary | ICD-10-CM | POA: Diagnosis not present

## 2023-10-26 DIAGNOSIS — H33322 Round hole, left eye: Secondary | ICD-10-CM | POA: Diagnosis not present

## 2023-10-26 DIAGNOSIS — Z0000 Encounter for general adult medical examination without abnormal findings: Secondary | ICD-10-CM

## 2023-10-26 DIAGNOSIS — Z973 Presence of spectacles and contact lenses: Secondary | ICD-10-CM | POA: Diagnosis not present

## 2023-10-26 DIAGNOSIS — H5213 Myopia, bilateral: Secondary | ICD-10-CM | POA: Diagnosis not present

## 2023-11-09 ENCOUNTER — Ambulatory Visit: Payer: Self-pay | Admitting: Family Medicine

## 2023-11-09 NOTE — Telephone Encounter (Signed)
If headache is as severe as it was earlier today when triage call took place, she needs to seek immediate medical attention. Otherwise we can arrange an appt for next week. Thanks, BJ

## 2023-11-09 NOTE — Telephone Encounter (Signed)
Copied from CRM [id number]. Topic: Clinical - Red Word Triage >> Nov 09, 2023 12:11 PM Tiffany H wrote: Red Word that prompted transfer to Nurse Triage: Extreme Pain.   Right side of head near the back of ear going toward back of head, paint has been having extreme stabbing pains every 45 minutes or so since she woke up today at 7AM.   Patient indicated she wants to come in for a brain scan. Please assist.   Chief Complaint: Headache Symptoms: Sharp stabbing pain behind right ear  Frequency: Intermittent  Pertinent Negatives: Patient denies fever, eye pain, or any other symptom  Disposition: [x] ED /[] Urgent Care (no appt availability in office) / [] Appointment(In office/virtual)/ []  [facility]/ [] Home Care/ [] Refused Recommended Disposition /[] [facility]/ []  Follow-up with PCP Additional Notes: Patient reports she had a headache earlier this week that resolved. She states that yesterday she began to experience a sharp pain to her head behind her right ear. She states the pain is intermittent and lasts for a few seconds at a time, but is 10/10 when present. She denies any history of similar pain. Patient advised that with no history of similar pain and the intensity and frequency of the pain she should go be evaluated in the ED. Patient verbalized understanding and is in agreement of this plan.     Reason for Disposition  [1] SEVERE headache (e.g., excruciating) AND [2] "worst headache" of life  Answer Assessment - Initial Assessment Questions 1. LOCATION: "Where does it hurt?"      Back of head behind right ear  2. ONSET: "When did the headache start?" (Minutes, hours or days)      Yesterday  3. PATTERN: "Does the pain come and go, or has it been constant since it started?"     Intermittent every 45 minutes  4. SEVERITY: "How bad is the pain?" and "What does it keep you from doing?"  (e.g., Scale 1-10; mild, moderate, or severe)   - MILD (1-3): doesn't interfere  with normal activities    - MODERATE (4-7): interferes with normal activities or awakens from sleep    - SEVERE (8-10): excruciating pain, unable to do any normal activities        10/10 when present  5. RECURRENT SYMPTOM: "Have you ever had headaches before?" If Yes, ask: "When was the last time?" and "What happened that time?"      Yes, but nothing like this  6. CAUSE: "What do you think is causing the headache?"     Unsure  7. MIGRAINE: "Have you been diagnosed with migraine headaches?" If Yes, ask: "Is this headache similar?"      No 8. HEAD INJURY: "Has there been any recent injury to the head?"      No 9. OTHER SYMPTOMS: "Do you have any other symptoms?" (fever, stiff neck, eye pain, sore throat, cold symptoms)     No 10. PREGNANCY: "Is there any chance you are pregnant?" "When was your last menstrual period?"       No  Protocols used: Headache-A-AH

## 2023-11-09 NOTE — Telephone Encounter (Signed)
I left patient a voicemail with the information below.

## 2023-11-09 NOTE — Telephone Encounter (Signed)
Noted

## 2023-11-16 NOTE — Progress Notes (Unsigned)
 ACUTE VISIT Chief Complaint  Patient presents with   Medical Management of Chronic Issues   HPI: Kathy Steele is a 48 y.o. female with a PMHx significant for anxiety, migraine headaches, iron deficiency anemia, and HLD, who is here today for follow up and complaining of headache.   She complains of intermittent right occipital headaches for the last 2 weeks. She describes the headaches as an "electric shock" sensation that lasts for a few seconds. She rates the headache as a 10/10 and says it happens 6-8 times per day, although she says it has decreased in frequency slightly over the last few days.  She mentions her head is a little sore in that area even when not having the acute headaches.  Also notes she was seen by her eye care provider about a week before this started because a sharp pain behind her eyes when looking to the right.  No abnormalities found. She has not taken anything for pain. Pertinent negatives include fever, photophobia, visual changes, nausea, vomiting, neck pain, recent injury, increased stress, or changes in her sleep.   No longer taking spironolactone 50 mg, which was prescribed for acne.   Review of Systems  Constitutional:  Negative for activity change, appetite change and unexpected weight change.  HENT:  Negative for congestion, sinus pain and sore throat.   Respiratory:  Negative for cough and shortness of breath.   Gastrointestinal:  Negative for abdominal pain.  Endocrine: Negative for cold intolerance and heat intolerance.  Genitourinary:  Negative for decreased urine volume, dysuria and hematuria.  Skin:  Negative for rash.  Neurological:  Negative for syncope, facial asymmetry and weakness.  See other pertinent positives and negatives in HPI.  Current Outpatient Medications on File Prior to Visit  Medication Sig Dispense Refill   Aspirin-Acetaminophen-Caffeine (GOODYS EXTRA STRENGTH PO) Take 1 packet by mouth daily as needed (headache).       BIOTIN PO 2 times daily.     BLACK CURRANT SEED OIL PO Take by mouth.     cetirizine (ZYRTEC) 10 MG tablet Take 10 mg by mouth daily.     ELDERBERRY PO Take by mouth.     fluticasone (FLONASE) 50 MCG/ACT nasal spray Place 2 sprays into both nostrils daily. 16 g 0   Multiple Vitamin (MULTIVITAMIN WITH MINERALS) TABS tablet Take 1 tablet by mouth daily.     No current facility-administered medications on file prior to visit.   Past Medical History:  Diagnosis Date   Headache(784.0)    History of chickenpox    Allergies  Allergen Reactions   Doxycycline Other (See Comments)   Escitalopram Oxalate     REACTION: Nausea, Ha and felt spacy   Social History   Socioeconomic History   Marital status: Married    Spouse name: Not on file   Number of children: Not on file   Years of education: Not on file   Highest education level: Not on file  Occupational History   Not on file  Tobacco Use   Smoking status: Former    Current packs/day: 0.00    Types: Cigarettes    Quit date: 05/09/2013    Years since quitting: 10.5   Smokeless tobacco: Never   Tobacco comments:    Stopped 1 year ago  Vaping Use   Vaping status: Never Used  Substance and Sexual Activity   Alcohol use: No    Alcohol/week: 0.0 standard drinks of alcohol    Comment: occasional   Drug  use: No   Sexual activity: Not on file  Other Topics Concern   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  Physical Exam Vitals and nursing note reviewed.  Constitutional:      General: She is not in acute distress.    Appearance: She is well-developed.  HENT:     Head: Normocephalic and atraumatic.     Mouth/Throat:     Mouth: Mucous membranes are moist.     Pharynx: Oropharynx is clear. Uvula midline.  Eyes:     Conjunctiva/sclera: Conjunctivae normal.  Cardiovascular:     Rate and Rhythm: Normal rate and regular rhythm.     Heart sounds: No murmur heard. Pulmonary:     Effort: Pulmonary effort is normal. No respiratory distress.     Breath sounds: Normal breath sounds.  Musculoskeletal:     Cervical back: Spasms (Trapezium muscles) present. No tenderness or bony tenderness. No pain with movement or muscular tenderness. Normal range of motion.  Lymphadenopathy:     Cervical: No cervical adenopathy.  Skin:    General: Skin is warm.     Findings: No erythema or rash.  Neurological:     General: No focal deficit present.     Mental Status: She is alert and oriented to person, place, and time.     Cranial Nerves: No cranial nerve deficit.     Gait: Gait normal.     Deep Tendon Reflexes:     Reflex Scores:      Bicep reflexes are 2+ on the right side and 2+ on the left side.      Patellar reflexes are 2+ on the right side and 2+ on the left side. Psychiatric:        Mood and Affect: Mood and affect normal.    ASSESSMENT AND PLAN:  [name] was seen today for headache.   New daily persistent headache Assessment & Plan: She has had headache in the past b ut this one she is reporting today is new, sudden onset.  We discussed possible etiologies,?  Tension headache. She agrees with trial of muscle relaxant at bedtime, we discussed some side effects, Flexeril 5 to 10 mg at bedtime for 14 days. She would like to have a head imaging done, head CT order placed. Will hold on blood work for  now. Instructed about warning signs..  Orders: -     CT HEAD WO CONTRAST ( ); Future  Neck muscle spasm Flexeril 5-10 mg at bedtime for 14 days. Local massage may also help.  -     Cyclobenzaprine HCl; Take 0.5-1 tablets (5-10 mg total) by mouth at bedtime  for 14 days.  Dispense: 14 tablet; Refill: 0   Return if symptoms worsen or fail to improve.  I, Rolla Etienne Wierda, acting as a scribe for Barbera Perritt [place], MD., have documented all relevant documentation on the behalf of Kathy Chauvin [place], MD, as directed by  Kathy Solivan [place], MD while in the presence of Kathy Gaus [place], MD.   I, Sirinity Outland [place], MD, have reviewed all documentation for this visit. The documentation on 11/20/23 for the exam, diagnosis, procedures, and orders are all accurate and complete.  Lariza Cothron G. [place], MD  [facility]. [place] office.

## 2023-11-19 ENCOUNTER — Ambulatory Visit (INDEPENDENT_AMBULATORY_CARE_PROVIDER_SITE_OTHER): Payer: BC Managed Care – PPO | Admitting: Family Medicine

## 2023-11-19 ENCOUNTER — Encounter: Payer: Self-pay | Admitting: Family Medicine

## 2023-11-19 VITALS — BP 110/70 | HR 80 | Resp 12 | Ht 64.0 in | Wt 163.4 lb

## 2023-11-19 DIAGNOSIS — G4452 New daily persistent headache (NDPH): Secondary | ICD-10-CM

## 2023-11-19 DIAGNOSIS — M62838 Other muscle spasm: Secondary | ICD-10-CM

## 2023-11-19 DIAGNOSIS — R519 Headache, unspecified: Secondary | ICD-10-CM

## 2023-11-19 MED ORDER — CYCLOBENZAPRINE HCL 10 MG PO TABS: 5.0000 mg | ORAL_TABLET | Freq: Every day | ORAL | 0 refills | Status: AC

## 2023-11-19 NOTE — Patient Instructions (Addendum)
 A few things to remember from today's visit:  Sudden onset of severe headache - Plan: CT HEAD WO CONTRAST ( )  New daily persistent headache - Plan: CT HEAD WO CONTRAST ( )  Do not use My Chart to request refills or for acute issues that need immediate attention. If you send a my chart message, it may take a few days to be addressed, specially if I am not in the office.  Please be sure medication list is accurate. If a new problem present, please set up appointment sooner than planned today.

## 2023-11-20 NOTE — Assessment & Plan Note (Addendum)
 She has had headache in the past b ut this one she is reporting today is new, sudden onset.  We discussed possible etiologies,?  Tension headache. She agrees with trial of muscle relaxant at bedtime, we discussed some side effects, Flexeril 5 to 10 mg at bedtime for 14 days. She would like to have a head imaging done, head CT order placed. Will hold on blood work for now. Instructed about warning signs.Marland Kitchen

## 2023-11-22 ENCOUNTER — Ambulatory Visit (HOSPITAL_BASED_OUTPATIENT_CLINIC_OR_DEPARTMENT_OTHER): Payer: BC Managed Care – PPO

## 2023-11-22 ENCOUNTER — Telehealth: Payer: Self-pay | Admitting: *Deleted

## 2023-11-22 NOTE — Telephone Encounter (Signed)
 Copied from CRM [id number]. Topic: Referral - Question >> Nov 22, 2023  1:16 PM Theodis Sato wrote: Reason for CRM: Patient states she needs Dr. [place] to initiate a prior authorization for the CT scan she ordered. Patient is outside of the imaging center now and was denied to be seen. Please call patient when prior authorization has gone through so that patient can reschedule.

## 2023-12-08 ENCOUNTER — Ambulatory Visit (HOSPITAL_BASED_OUTPATIENT_CLINIC_OR_DEPARTMENT_OTHER)
Admission: RE | Admit: 2023-12-08 | Discharge: 2023-12-08 | Disposition: A | Discharge status: Home / Self Care | Source: Ambulatory Visit | Attending: Family Medicine | Admitting: Family Medicine

## 2023-12-08 DIAGNOSIS — R519 Headache, unspecified: Secondary | ICD-10-CM | POA: Diagnosis not present

## 2023-12-08 DIAGNOSIS — G4452 New daily persistent headache (NDPH): Secondary | ICD-10-CM | POA: Insufficient documentation

## 2024-01-03 ENCOUNTER — Encounter: Payer: Self-pay | Admitting: Family Medicine

## 2024-01-30 DIAGNOSIS — L700 Acne vulgaris: Secondary | ICD-10-CM | POA: Diagnosis not present

## 2024-04-27 ENCOUNTER — Encounter (HOSPITAL_BASED_OUTPATIENT_CLINIC_OR_DEPARTMENT_OTHER): Payer: Self-pay | Admitting: Emergency Medicine

## 2024-04-27 ENCOUNTER — Other Ambulatory Visit: Payer: Self-pay

## 2024-04-27 ENCOUNTER — Emergency Department (HOSPITAL_BASED_OUTPATIENT_CLINIC_OR_DEPARTMENT_OTHER)
Admission: EM | Admit: 2024-04-27 | Discharge: 2024-04-28 | Disposition: A | Discharge status: Home / Self Care | Attending: Emergency Medicine | Admitting: Emergency Medicine

## 2024-04-27 DIAGNOSIS — R102 Pelvic and perineal pain: Secondary | ICD-10-CM | POA: Diagnosis not present

## 2024-04-27 DIAGNOSIS — D649 Anemia, unspecified: Secondary | ICD-10-CM | POA: Diagnosis not present

## 2024-04-27 DIAGNOSIS — D259 Leiomyoma of uterus, unspecified: Secondary | ICD-10-CM | POA: Diagnosis not present

## 2024-04-27 DIAGNOSIS — R739 Hyperglycemia, unspecified: Secondary | ICD-10-CM | POA: Diagnosis not present

## 2024-04-27 LAB — COMPREHENSIVE METABOLIC PANEL WITH GFR
ALT: 9 U/L (ref 0–44)
AST: 18 U/L (ref 15–41)
Albumin: 4 g/dL (ref 3.5–5.0)
Alkaline Phosphatase: 61 U/L (ref 38–126)
Anion gap: 13 (ref 5–15)
BUN: 11 mg/dL (ref 6–20)
CO2: 20 mmol/L — ABNORMAL LOW (ref 22–32)
Calcium: 9.1 mg/dL (ref 8.9–10.3)
Chloride: 103 mmol/L (ref 98–111)
Creatinine, Ser: 0.65 mg/dL (ref 0.44–1.00)
GFR, Estimated: >60 mL/min (ref >60)
Glucose, Bld: 115 mg/dL — ABNORMAL HIGH (ref 70–99)
Potassium: 3.8 mmol/L (ref 3.5–5.1)
Sodium: 137 mmol/L (ref 135–145)
Total Bilirubin: 0.2 mg/dL (ref 0.0–1.2)
Total Protein: 7.4 g/dL (ref 6.5–8.1)

## 2024-04-27 LAB — CBC
HCT: 35 % — ABNORMAL LOW (ref 36.0–46.0)
Hemoglobin: 11.2 g/dL — ABNORMAL LOW (ref 12.0–15.0)
MCH: 29.1 pg (ref 26.0–34.0)
MCHC: 32 g/dL (ref 30.0–36.0)
MCV: 90.9 fL (ref 80.0–100.0)
Platelets: 344 K/uL (ref 150–400)
RBC: 3.85 MIL/uL — ABNORMAL LOW (ref 3.87–5.11)
RDW: 13.2 % (ref 11.5–15.5)
WBC: 6.3 K/uL (ref 4.0–10.5)
nRBC: 0 % (ref 0.0–0.2)

## 2024-04-27 LAB — LIPASE, BLOOD: Lipase: 22 U/L (ref 11–51)

## 2024-04-27 NOTE — ED Notes (Signed)
 Patient unable to provide urine sample at this time. Specimen cup given to patient and reviewed instructions for clean catch collection.

## 2024-04-27 NOTE — ED Triage Notes (Signed)
 Right flank pain into back Started today Hx endometriois and fibroids This feels different

## 2024-04-28 ENCOUNTER — Emergency Department (HOSPITAL_BASED_OUTPATIENT_CLINIC_OR_DEPARTMENT_OTHER)

## 2024-04-28 DIAGNOSIS — D259 Leiomyoma of uterus, unspecified: Secondary | ICD-10-CM | POA: Diagnosis not present

## 2024-04-28 DIAGNOSIS — R109 Unspecified abdominal pain: Secondary | ICD-10-CM | POA: Diagnosis not present

## 2024-04-28 DIAGNOSIS — N809 Endometriosis, unspecified: Secondary | ICD-10-CM | POA: Diagnosis not present

## 2024-04-28 DIAGNOSIS — R102 Pelvic and perineal pain: Secondary | ICD-10-CM | POA: Diagnosis not present

## 2024-04-28 LAB — URINALYSIS, ROUTINE W REFLEX MICROSCOPIC
Bacteria, UA: NONE SEEN
Bilirubin Urine: NEGATIVE
Glucose, UA: NEGATIVE mg/dL
Hgb urine dipstick: NEGATIVE
Ketones, ur: NEGATIVE mg/dL
Leukocytes,Ua: NEGATIVE
Nitrite: NEGATIVE
Protein, ur: NEGATIVE mg/dL
Specific Gravity, Urine: 1.027 (ref 1.005–1.030)
pH: 7 (ref 5.0–8.0)

## 2024-04-28 LAB — WET PREP, GENITAL
Clue Cells Wet Prep HPF POC: NONE SEEN
Sperm: NONE SEEN
Trich, Wet Prep: NONE SEEN
WBC, Wet Prep HPF POC: <10 (ref <10)
Yeast Wet Prep HPF POC: NONE SEEN

## 2024-04-28 LAB — PREGNANCY, URINE: Preg Test, Ur: NEGATIVE

## 2024-04-28 LAB — HIV ANTIBODY (ROUTINE TESTING W REFLEX): HIV Screen 4th Generation wRfx: NONREACTIVE

## 2024-04-28 LAB — RPR: RPR Ser Ql: NONREACTIVE

## 2024-04-28 MED ORDER — KETOROLAC TROMETHAMINE 60 MG/2ML IM SOLN
60.0000 mg | Freq: Once | INTRAMUSCULAR | Status: AC
  Administered 2024-04-28: 60 mg via INTRAMUSCULAR
  Filled 2024-04-28: qty 2

## 2024-04-28 MED ORDER — NAPROXEN 500 MG PO TABS: 500.0000 mg | ORAL_TABLET | Freq: Two times a day (BID) | ORAL | 0 refills | Status: AC

## 2024-04-28 NOTE — ED Provider Notes (Signed)
 [place] EMERGENCY DEPARTMENT AT [facility] Provider Note   CSN: [id number] Arrival date & time: 04/27/24  2226     Patient presents with: Abdominal Pain   Kathy Steele is a 48 y.o. female.   The history is provided by the patient.  Abdominal Pain    She had sudden onset at about 3:30 PM of right suprapubic pain radiating to the right side of her lower back.  There is no associated nausea or vomiting.  She denies any urinary difficulty.  She took 3 doses of Midol , without relief.  Nothing makes the pain better, nothing makes it worse.  Last menses was July 6 and was normal.  She does tell me she has history of endometriosis and uterine fibroid but this pain is different from anything she has felt before.  Prior to Admission medications   Medication Sig Start Date End Date Taking? Authorizing Provider  Aspirin-Acetaminophen-Caffeine (GOODYS EXTRA STRENGTH PO) Take 1 packet by mouth daily as needed (headache).     [provider]  BIOTIN PO 2 times daily.    [provider]  BLACK CURRANT SEED OIL PO Take by mouth.    [provider]  cetirizine (ZYRTEC) 10 MG tablet Take 10 mg by mouth daily.    [provider]  ELDERBERRY PO Take by mouth.    [provider]  fluticasone  (FLONASE ) 50 MCG/ACT nasal spray Place 2 sprays into both nostrils daily. 10/22/18   Iola Lukes, FNP  Multiple Vitamin (MULTIVITAMIN WITH MINERALS) TABS tablet Take 1 tablet by mouth daily.    [provider]    Allergies: Doxycycline  and Escitalopram oxalate    Review of Systems  Gastrointestinal:  Positive for abdominal pain.  All other systems reviewed and are negative.   Updated Vital Signs BP 134/71 (BP Location: Left Arm)   Pulse 77   Temp 98.3 F (36.8 C)   Resp 17   LMP 03/31/2024 (Approximate)   SpO2 99%   Physical Exam Vitals and nursing note reviewed. Exam conducted with a chaperone present.   49 year old female,  resting comfortably and in no acute distress. Vital signs are normal. Oxygen saturation is 99%, which is normal. Head is normocephalic and atraumatic. PERRLA, EOMI.  Back is nontender and there is no CVA tenderness. Lungs are clear without rales, wheezes, or rhonchi. Chest is nontender. Heart has regular rate and rhythm without murmur. Abdomen is soft, flat, with mild suprapubic tenderness.  There is no rebound or guarding. Pelvic: Normal external female genitalia.  Cervix is closed.  Small amount of yellowish vaginal discharge present.  There is no cervical motion tenderness.  Fundus is normal size and position and nontender.  There is mild right adnexal fullness and tenderness, normal left adnexa without mass or tenderness. Extremities have no cyanosis or edema, full range of motion is present. Skin is warm and dry without rash. Neurologic: Mental status is normal, cranial nerves are intact, moves all extremities equally.  (all labs ordered are listed, but only abnormal results are displayed) Labs Reviewed  COMPREHENSIVE METABOLIC PANEL WITH GFR - Abnormal; Notable for the following components:      Result Value   CO2 20 (*)    Glucose, Bld 115 (*)    All other components within normal limits  CBC - Abnormal; Notable for the following components:   RBC 3.85 (*)    Hemoglobin 11.2 (*)    HCT 35.0 (*)    All other components within  normal limits  URINALYSIS, ROUTINE W REFLEX MICROSCOPIC - Abnormal; Notable for the following components:   APPearance HAZY (*)    Crystals PRESENT (*)    All other components within normal limits  WET PREP, GENITAL  LIPASE, BLOOD  PREGNANCY, URINE  RPR  HIV ANTIBODY (ROUTINE TESTING W REFLEX)  GC/CHLAMYDIA PROBE AMP () NOT AT [facility]     Radiology: US  PELVIC COMPLETE W TRANSVAGINAL AND TORSION R/O Result Date: 04/28/2024 CLINICAL DATA:  Right pelvic pain. EXAM: TRANSABDOMINAL ULTRASOUND OF PELVIS DOPPLER ULTRASOUND OF OVARIES TECHNIQUE:  Transabdominal ultrasound examination of the pelvis was performed including evaluation of the uterus, ovaries, adnexal regions, and pelvic cul-de-sac. Color and duplex Doppler ultrasound was utilized to evaluate blood flow to the ovaries. COMPARISON:  CT stone study from earlier same day FINDINGS: Uterus Measurements: 9.2 x 4.9 x 6.0 cm = volume: 141 mL. No fibroids or other mass visualized. Endometrium Thickness: 10 mm.  Multiple fibroids evident measuring up to 3.7 cm. Right ovary Measurements: 3.8 x 1.9 x 1.7 cm = volume: 6.3 mL. 1.9 cm dominant follicle evident. Left ovary Measurements: 2.5 x 1.8 x 1.9 cm = volume: 4.3 mL. Normal appearance/no adnexal mass. Pulsed Doppler evaluation demonstrates normal low-resistance arterial and venous waveforms in both ovaries. Other: No intraperitoneal free fluid. Patient declined endovaginal scanning. IMPRESSION: 1. No evidence for ovarian mass or torsion. 2. Multiple uterine fibroids measuring up to 3.7 cm. Electronically Signed   By: Camellia Candle M.D.   On: 04/28/2024 06:45   CT Renal Stone Study Result Date: 04/28/2024 EXAM: CT UROGRAM 04/28/2024 12:40:11 AM TECHNIQUE: CT of the abdomen and pelvis was performed before and after the administration of intravenous contrast as per CT urogram protocol. Multiplanar reformatted images as well as MIP urogram images are provided for review. Automated exposure control, iterative reconstruction, and/or weight based adjustment of the mA/kV was utilized to reduce the radiation dose to as low as reasonably achievable. COMPARISON: None available. CLINICAL HISTORY: Abdominal/flank pain, stone suspected. Right flank pain into back. Started today. History of endometriosis and fibroids. This feels different. FINDINGS: LOWER CHEST: No acute abnormality. LIVER: The liver is unremarkable. GALLBLADDER AND BILE DUCTS: Gallbladder is unremarkable. No biliary ductal dilatation. SPLEEN: No acute abnormality. PANCREAS: No acute abnormality.  ADRENAL GLANDS: No acute abnormality. KIDNEYS, URETERS AND BLADDER: No urinary calculi or hydronephrosis. No perinephric or periureteral stranding. Urinary bladder is unremarkable. GI AND BOWEL: Normal appendix. No bowel obstruction or bowel wall thickening. PERITONEUM AND RETROPERITONEUM: No ascites. No free air. VASCULATURE: Aorta is normal in caliber. LYMPH NODES: No lymphadenopathy. REPRODUCTIVE ORGANS: Lobular appearance of the uterus suggesting uterine fibroids. Small amount of stranding and fluid about the cervix. Normal noncontrast appearance of the adnexa. BONES AND SOFT TISSUES: No acute osseous abnormality. No focal soft tissue abnormality. IMPRESSION: 1. Small amount of stranding and fluid about the cervix. This may be physiologic or related to history of endometriosis. If there is concern for cervicitis, consider direct visualization. 2. Lobular appearance of the uterus suggesting uterine fibroids. Electronically signed by: Norman Gatlin MD 04/28/2024 12:56 AM EDT RP Workstation: [id number]     Procedures   Medications Ordered in the ED - No data to display                                  Medical Decision Making Amount and/or Complexity of Data Reviewed Labs: ordered. Radiology: ordered.  Risk Prescription drug management.  Right lower quadrant and flank pain.  This is a presentation which has a wide range of treatment options and carries with a high risk of morbidity and complications.  Differential diagnosis includes, but is not limited to, ruptured ovarian cyst, appendicitis, urolithiasis, pyelonephritis, diverticulitis, ovarian torsion, pelvic inflammatory disease, endometriosis.  I have reviewed her laboratory tests, and my interpretation is elevated random glucose level which will need to be followed as an outpatient, stable anemia, normal WBC, urinalysis significant for presence of crystals.  I ordered renal stone protocol CT scan and have ordered a dose of  ketorolac .  She feels much better after ketorolac .  CT scan shows small amount of stranding and fluid about the cervix which may be physiologic or related to history of endometriosis.  Uterine fibroids noted.  I independently viewed the images, and agree with the radiologist's interpretation.  Pelvic exam showed no evidence of cervicitis or pelvic inflammatory disease, right adnexal tenderness.  I have ordered a pelvic ultrasound to evaluate for ovarian cyst and ovarian torsion.  Wet prep is negative for yeast, trichomonas, clue cells, WBCs.  Pelvic ultrasound shows uterine fibroids measuring up to 3.7 cm but no acute findings.  Have independently viewed the images, and agree with the radiologist's interpretation.  I am discharging her with prescription for naproxen  and advised to eat supplement with acetaminophen as needed.  I suspect pain may have been from torsion of one of her fibroids.  I am referring her back to her gynecologist for further evaluation.     Final diagnoses:  Acute pelvic pain, female  Elevated random blood glucose level  Normochromic normocytic anemia  Uterine leiomyoma, unspecified location    ED Discharge Orders          Ordered    naproxen  (NAPROSYN ) 500 MG tablet  2 times daily        04/28/24 0654               Raford Lenis, MD 04/28/24 [phone]

## 2024-04-28 NOTE — Discharge Instructions (Addendum)
 Take the naproxen  twice a day.  If you need additional pain relief, add acetaminophen.  If pain is still not being controlled, you may return to the emergency department for further evaluation.  Please follow-up with your gynecologist for further evaluation.

## 2024-04-28 NOTE — ED Notes (Signed)
 ED Provider at bedside.

## 2024-04-28 NOTE — ED Notes (Signed)
 Patient transported to Ultrasound

## 2024-04-28 NOTE — ED Notes (Signed)
 Patient transported to CT

## 2024-04-29 LAB — GC/CHLAMYDIA PROBE AMP (~~LOC~~) NOT AT ARMC
Chlamydia: NEGATIVE
Comment: NEGATIVE
Comment: NORMAL
Neisseria Gonorrhea: NEGATIVE

## 2024-05-02 ENCOUNTER — Telehealth: Payer: Self-pay

## 2024-05-02 NOTE — Transitions of Care (Post Inpatient/ED Visit) (Signed)
   05/02/2024  Name: Kathy Steele MRN: [id number] DOB: Jul 13, 1976  Today's TOC FU Call Status:    Attempted to reach the patient regarding the most recent Inpatient/ED visit.  Follow Up Plan: Additional outreach attempts will be made to reach the patient to complete the Transitions of Care (Post Inpatient/ED visit) call.   Signature: Crew Goren, CMA

## 2024-05-09 ENCOUNTER — Encounter (HOSPITAL_BASED_OUTPATIENT_CLINIC_OR_DEPARTMENT_OTHER): Payer: Self-pay

## 2024-05-09 ENCOUNTER — Other Ambulatory Visit: Payer: Self-pay

## 2024-05-09 ENCOUNTER — Emergency Department (HOSPITAL_BASED_OUTPATIENT_CLINIC_OR_DEPARTMENT_OTHER)

## 2024-05-09 ENCOUNTER — Emergency Department (HOSPITAL_BASED_OUTPATIENT_CLINIC_OR_DEPARTMENT_OTHER)
Admission: EM | Admit: 2024-05-09 | Discharge: 2024-05-10 | Disposition: A | Discharge status: Home / Self Care | Attending: Emergency Medicine | Admitting: Emergency Medicine

## 2024-05-09 DIAGNOSIS — Z79899 Other long term (current) drug therapy: Secondary | ICD-10-CM | POA: Insufficient documentation

## 2024-05-09 DIAGNOSIS — R0789 Other chest pain: Secondary | ICD-10-CM | POA: Diagnosis not present

## 2024-05-09 DIAGNOSIS — R29818 Other symptoms and signs involving the nervous system: Secondary | ICD-10-CM | POA: Diagnosis present

## 2024-05-09 DIAGNOSIS — R2990 Unspecified symptoms and signs involving the nervous system: Secondary | ICD-10-CM

## 2024-05-09 DIAGNOSIS — R202 Paresthesia of skin: Secondary | ICD-10-CM | POA: Insufficient documentation

## 2024-05-09 DIAGNOSIS — F419 Anxiety disorder, unspecified: Secondary | ICD-10-CM | POA: Diagnosis not present

## 2024-05-09 LAB — DIFFERENTIAL
Abs Immature Granulocytes: 0.02 K/uL (ref 0.00–0.07)
Basophils Absolute: 0 K/uL (ref 0.0–0.1)
Basophils Relative: 1 %
Eosinophils Absolute: 0.1 K/uL (ref 0.0–0.5)
Eosinophils Relative: 1 %
Immature Granulocytes: 0 %
Lymphocytes Relative: 32 %
Lymphs Abs: 2.5 K/uL (ref 0.7–4.0)
Monocytes Absolute: 0.8 K/uL (ref 0.1–1.0)
Monocytes Relative: 10 %
Neutro Abs: 4.5 K/uL (ref 1.7–7.7)
Neutrophils Relative %: 56 %

## 2024-05-09 LAB — CBC
HCT: 36.1 % (ref 36.0–46.0)
Hemoglobin: 11.7 g/dL — ABNORMAL LOW (ref 12.0–15.0)
MCH: 29 pg (ref 26.0–34.0)
MCHC: 32.4 g/dL (ref 30.0–36.0)
MCV: 89.4 fL (ref 80.0–100.0)
Platelets: 390 K/uL (ref 150–400)
RBC: 4.04 MIL/uL (ref 3.87–5.11)
RDW: 13.4 % (ref 11.5–15.5)
WBC: 8.2 K/uL (ref 4.0–10.5)
nRBC: 0 % (ref 0.0–0.2)

## 2024-05-09 LAB — BASIC METABOLIC PANEL WITH GFR
Anion gap: 15 (ref 5–15)
BUN: 15 mg/dL (ref 6–20)
CO2: 19 mmol/L — ABNORMAL LOW (ref 22–32)
Calcium: 9.5 mg/dL (ref 8.9–10.3)
Chloride: 103 mmol/L (ref 98–111)
Creatinine, Ser: 0.68 mg/dL (ref 0.44–1.00)
GFR, Estimated: >60 mL/min (ref >60)
Glucose, Bld: 91 mg/dL (ref 70–99)
Potassium: 3.5 mmol/L (ref 3.5–5.1)
Sodium: 137 mmol/L (ref 135–145)

## 2024-05-09 LAB — PROTIME-INR
INR: 1 (ref 0.8–1.2)
Prothrombin Time: 14 s (ref 11.4–15.2)

## 2024-05-09 LAB — APTT: aPTT: 29 s (ref 24–36)

## 2024-05-09 LAB — TROPONIN T, HIGH SENSITIVITY: Troponin T High Sensitivity: <15 ng/L (ref 0–19)

## 2024-05-09 NOTE — ED Provider Notes (Signed)
 [place] EMERGENCY DEPARTMENT AT [facility] Provider Note   CSN: [id number] Arrival date & time: 05/09/24  2237     Patient presents with: Chest Pain and Anxiety   Kathy Steele is a 48 y.o. female.   Patient reports that she went to [employer], went home to unload her groceries and suddenly felt numb on the right side of her face.  Since then she has become anxious, feels like she is not breathing well and has tightness in the chest.  She reports that she drove straight to the hospital.  She is still feeling the numbness.  She reports that while she was at the waiting room she had difficulty manipulating her phone with her right hand, it felt somewhat weak.       Prior to Admission medications   Medication Sig Start Date End Date Taking? Authorizing Provider  Aspirin -Acetaminophen-Caffeine (GOODYS EXTRA STRENGTH PO) Take 1 packet by mouth daily as needed (headache).     [provider]  BIOTIN PO 2 times daily.    [provider]  BLACK CURRANT SEED OIL PO Take by mouth.    [provider]  cetirizine (ZYRTEC) 10 MG tablet Take 10 mg by mouth daily.    [provider]  ELDERBERRY PO Take by mouth.    [provider]  fluticasone  (FLONASE ) 50 MCG/ACT nasal spray Place 2 sprays into both nostrils daily. 10/22/18   Iola Lukes, FNP  Multiple Vitamin (MULTIVITAMIN WITH MINERALS) TABS tablet Take 1 tablet by mouth daily.    [provider]  naproxen  (NAPROSYN ) 500 MG tablet Take 1 tablet (500 mg total) by mouth 2 (two) times daily. 04/28/24   Raford Lenis, MD    Allergies: Doxycycline  and Escitalopram oxalate    Review of Systems  Updated Vital Signs BP 103/70   Pulse 75   Temp 98.1 F (36.7 C) (Oral)   Resp 15   Ht 5' 4 (1.626 m)   Wt 74.4 kg   LMP 05/09/2024 (Approximate)   SpO2 98%   BMI 28.15 kg/m   Physical Exam Vitals and nursing note reviewed.  Constitutional:      General: She is  not in acute distress.    Appearance: She is well-developed.  HENT:     Head: Normocephalic and atraumatic.     Mouth/Throat:     Mouth: Mucous membranes are moist.  Eyes:     General: Vision grossly intact. Gaze aligned appropriately.     Extraocular Movements: Extraocular movements intact.     Conjunctiva/sclera: Conjunctivae normal.  Cardiovascular:     Rate and Rhythm: Normal rate and regular rhythm.     Pulses: Normal pulses.     Heart sounds: Normal heart sounds, S1 normal and S2 normal. No murmur heard.    No friction rub. No gallop.  Pulmonary:     Effort: Pulmonary effort is normal. No respiratory distress.     Breath sounds: Normal breath sounds.  Abdominal:     General: Bowel sounds are normal.     Palpations: Abdomen is soft.     Tenderness: There is no abdominal tenderness. There is no guarding or rebound.     Hernia: No hernia is present.  Musculoskeletal:        General: No swelling.     Cervical back: Full passive range of motion without pain, normal range of motion and neck supple. No spinous process tenderness or muscular tenderness. Normal range of motion.  Right lower leg: No edema.     Left lower leg: No edema.  Skin:    General: Skin is warm and dry.     Capillary Refill: Capillary refill takes less than 2 seconds.     Findings: No ecchymosis, erythema, rash or wound.  Neurological:     General: No focal deficit present.     Mental Status: She is alert and oriented to person, place, and time.     GCS: GCS eye subscore is 4. GCS verbal subscore is 5. GCS motor subscore is 6.     Cranial Nerves: Cranial nerves 2-12 are intact.     Sensory: Sensation is intact.     Motor: Motor function is intact.     Coordination: Coordination is intact.     Comments: Extraocular muscle movement: normal No visual field cut Pupils: equal and reactive both direct and consensual response is normal No nystagmus present    Sensory function is intact to light touch,  pinprick Proprioception intact  Grip strength 5/5 symmetric in upper extremities No pronator drift Normal finger to nose bilaterally  Lower extremity strength 5/5 against gravity      Psychiatric:        Attention and Perception: Attention normal.        Mood and Affect: Mood is anxious.        Speech: Speech normal.        Behavior: Behavior normal.     (all labs ordered are listed, but only abnormal results are displayed) Labs Reviewed  BASIC METABOLIC PANEL WITH GFR - Abnormal; Notable for the following components:      Result Value   CO2 19 (*)    All other components within normal limits  CBC - Abnormal; Notable for the following components:   Hemoglobin 11.7 (*)    All other components within normal limits  ETHANOL  PROTIME-INR  APTT  URINE DRUG SCREEN  HEPATIC FUNCTION PANEL  HCG, SERUM, QUALITATIVE  DIFFERENTIAL  TROPONIN T, HIGH SENSITIVITY  TROPONIN T, HIGH SENSITIVITY    EKG: EKG Interpretation Date/Time:  Friday May 09 2024 22:46:41 EDT Ventricular Rate:  86 PR Interval:  148 QRS Duration:  72 QT Interval:  354 QTC Calculation: 423 R Axis:   68  Text Interpretation: Normal sinus rhythm Normal ECG When compared with ECG of 08-Jan-2022 22:56, No significant change was found Confirmed by Haze Lonni PARAS [phone]) on 05/10/2024 12:44:33 AM  Radiology: CT HEAD CODE STROKE WO CONTRAST Result Date: 05/10/2024 EXAM: CT HEAD WITHOUT CONTRAST 05/09/2024 11:54:55 PM TECHNIQUE: CT of the head was performed without the administration of intravenous contrast. Automated exposure control, iterative reconstruction, and/or weight based adjustment of the mA/kV was utilized to reduce the radiation dose to as low as reasonably achievable. COMPARISON: 12/08/2023 CLINICAL HISTORY: Neuro deficit, acute, stroke suspected. FINDINGS: BRAIN AND VENTRICLES: No acute hemorrhage. Gray-white differentiation is preserved. No hydrocephalus. No extra-axial collection. No mass  effect or midline shift. ASPECTS is 10. ORBITS: No acute abnormality. SINUSES: No acute abnormality. SOFT TISSUES AND SKULL: No acute soft tissue abnormality. No skull fracture. IMPRESSION: 1. No acute intracranial abnormality. Electronically signed by: Franky Stanford MD 05/10/2024 01:35 AM EDT RP Workstation: [id number]   DG Chest Port 1 View Result Date: 05/10/2024 CLINICAL DATA:  Chest pain EXAM: PORTABLE CHEST 1 VIEW COMPARISON:  12/16/2021 FINDINGS: The heart size and mediastinal contours are within normal limits. Both lungs are clear. The visualized skeletal structures are unremarkable. No pneumothorax. IMPRESSION: No active disease.  Electronically Signed   By: Franky Crease M.D.   On: 05/10/2024 00:17     Procedures   Medications Ordered in the ED - No data to display                                  Medical Decision Making Amount and/or Complexity of Data Reviewed Labs: ordered. Radiology: ordered.  Risk Decision regarding hospitalization.   Differential diagnosis considered includes, but not limited to:  TIA; Stroke; seizure; complicated migraine; metabolic encephalopathy; chest pain; anxiety  Presents to the emergency department with multiple complaints.  She initially started to feel numbness on the right side of her face.  Since then she has developed some anxiousness, chest discomfort.  While in the waiting room she had difficulty holding her phone with her right hand.  Cardiac evaluation had a been initiated during triage.  After I discovered the neurologic findings upon my examination, code stroke was initiated.  Patient has been evaluated by neurology and it is recommended that she be admitted for routine TIA/stroke workup including contrast MRI to evaluate for MS.  Her cardiac evaluation here in the ED has been negative.      Final diagnoses:  Neurosensory deficit    ED Discharge Orders     None          Haze Lonni PARAS, MD 05/10/24 [phone]

## 2024-05-09 NOTE — ED Triage Notes (Signed)
 Pt reports sudden onset facial numbness, chest pain and anxiety. Pt reports more stress at work but nothing specific to trigger anxiety. Pt denies history of panic attacks.

## 2024-05-10 DIAGNOSIS — K449 Diaphragmatic hernia without obstruction or gangrene: Secondary | ICD-10-CM | POA: Diagnosis not present

## 2024-05-10 DIAGNOSIS — R0789 Other chest pain: Secondary | ICD-10-CM | POA: Diagnosis not present

## 2024-05-10 DIAGNOSIS — R29818 Other symptoms and signs involving the nervous system: Secondary | ICD-10-CM | POA: Diagnosis present

## 2024-05-10 DIAGNOSIS — R200 Anesthesia of skin: Secondary | ICD-10-CM | POA: Diagnosis not present

## 2024-05-10 DIAGNOSIS — R202 Paresthesia of skin: Secondary | ICD-10-CM | POA: Diagnosis not present

## 2024-05-10 DIAGNOSIS — I639 Cerebral infarction, unspecified: Secondary | ICD-10-CM | POA: Diagnosis not present

## 2024-05-10 DIAGNOSIS — R29700 NIHSS score 0: Secondary | ICD-10-CM | POA: Diagnosis not present

## 2024-05-10 DIAGNOSIS — M6281 Muscle weakness (generalized): Secondary | ICD-10-CM | POA: Diagnosis not present

## 2024-05-10 DIAGNOSIS — R29898 Other symptoms and signs involving the musculoskeletal system: Secondary | ICD-10-CM | POA: Diagnosis not present

## 2024-05-10 LAB — HEPATIC FUNCTION PANEL
ALT: 12 U/L (ref 0–44)
AST: 18 U/L (ref 15–41)
Albumin: 4.4 g/dL (ref 3.5–5.0)
Alkaline Phosphatase: 64 U/L (ref 38–126)
Bilirubin, Direct: 0.2 mg/dL (ref 0.0–0.2)
Indirect Bilirubin: 0.4 mg/dL (ref 0.3–0.9)
Total Bilirubin: 0.6 mg/dL (ref 0.0–1.2)
Total Protein: 7.8 g/dL (ref 6.5–8.1)

## 2024-05-10 LAB — URINE DRUG SCREEN
Amphetamines: NOT DETECTED
Barbiturates: NOT DETECTED
Benzodiazepines: NOT DETECTED
Cocaine: NOT DETECTED
Fentanyl: NOT DETECTED
Methadone Scn, Ur: NOT DETECTED
Opiates: NOT DETECTED
Tetrahydrocannabinol: NOT DETECTED

## 2024-05-10 LAB — ETHANOL: Alcohol, Ethyl (B): <15 mg/dL (ref <15)

## 2024-05-10 LAB — HCG, SERUM, QUALITATIVE: Preg, Serum: NEGATIVE

## 2024-05-10 LAB — TROPONIN T, HIGH SENSITIVITY: Troponin T High Sensitivity: <15 ng/L (ref 0–19)

## 2024-05-10 MED ORDER — ASPIRIN 81 MG PO CHEW
324.0000 mg | CHEWABLE_TABLET | Freq: Once | ORAL | Status: AC
  Administered 2024-05-10: 324 mg via ORAL
  Filled 2024-05-10: qty 4

## 2024-05-10 NOTE — Progress Notes (Signed)
 Plan of Care Note for accepted transfer   Patient: Kathy Steele MRN: [id number]   DOA: 05/09/2024  Facility requesting transfer: [facility]   Requesting Provider: Dr. Haze   Reason for transfer: Right-sided numbness   Facility course: 48 yr old female with hx of HLD, iron-deficiency anemia, migraines, and anxiety who presents with acute-onset right facial numbness followed by RUE numbness.   Basic labs are essentially normal and there are no acute findings on head CT.   She was evaluated by teleneurology who recommended ASA 325 mg and further workup including MRI brain with and without contrasts.   Plan of care: The patient is accepted for admission to Telemetry unit, at [facility].   Author: Evalene GORMAN Sprinkles, MD 05/10/2024  Check [email or web address] for on-call coverage.  Nursing staff, Please call [facility] Admits & Consults System-Wide number on Amion as soon as patient's arrival, so appropriate admitting provider can evaluate the pt.

## 2024-05-10 NOTE — ED Notes (Signed)
 Provider saw patient, as well as myself and tried to persuade her she should stay. She denied and risks were gone over with her. She knows and signed AMA.

## 2024-05-10 NOTE — Consult Note (Signed)
 [facility] Consult Services   Patient Name:   Kathy Steele, Kathy Steele Date of Birth:   July 28, 1976 Identification Number:   MRN - [id number] Date of Service:   05/09/2024 23:31:02  Diagnosis:       R20.2 - Paresthesia of skin  Impression:      Kathy Steele is a 48 yo F who presents with right sided numbness. Exam is notable for mild right facial numbness. Head CT showed no acute intracranial pathology. Ddx includes migraine w/ aura vs TIA/mild ischemic stroke vs CNS inflammatory disease. No indication for thrombolytics given nondisabling deficits. Recommend the following:  - ASA 325 mg once  - routine MRI brain w/wo contrast  - goal BP is normotension  - Neurology to follow  Our recommendations are outlined below.  Recommendations:        Bedside Swallow Eval       DVT Prophylaxis       IV Fluids, Normal Saline       Head of Bed 30 Degrees       Euglycemia and Avoid Hyperthermia (PRN Acetaminophen)  Sign Out:       Discussed with Emergency Department Provider    ------------------------------------------------------------------------------  Advanced Imaging: Advanced Imaging Deferred because:  Non-disabling symptoms as verified by the patient; no cortical signs so not consistent with LVO   Metrics: Last Known Well: 05/09/2024 21:45:00 Dispatch Time: 05/09/2024 23:31:01 Arrival Time: 05/09/2024 22:37:00 Initial Response Time: 05/09/2024 23:40:00 Symptoms: right sided numbness. Initial patient interaction: 05/09/2024 23:42:41 NIHSS Assessment Completed: 05/09/2024 23:49:20 Patient is not a candidate for Thrombolytic. Thrombolytic Medical Decision: 05/10/2024 00:04:54 Patient was not deemed candidate for Thrombolytic because of following reasons: Stroke severity too mild (non-disabling) .  CT Head: I personally reviewed all the CT images that were available to me and it showed: no acute hemorrhage  Primary Provider Notified of Diagnostic Impression  and Management Plan on: 05/10/2024 00:12:00    ------------------------------------------------------------------------------  History of Present Illness: Patient is a 48 year old Female.  Patient was brought by private transportation with symptoms of right sided numbness. Kathy Steele is a 48 yo F who presents with right sided numbness. LKN at 2145. Around that time, developed right facial numbness. Later experienced chest pain followed by right arm numbness. Denies any weakness. Denies any headache or photophobia.    Past Medical History:      There is no history of Hypertension  Medications:  No Anticoagulant use  No Antiplatelet use Reviewed EMR for current medications  Allergies:  Reviewed  Social History: Smoking: No Alcohol Use: No Drug Use: No  Family History:  There is no family history of premature cerebrovascular disease pertinent to this consultation  ROS : 14 Points Review of Systems was performed and was negative except mentioned in HPI.  Past Surgical History: There Is No Surgical History Contributory To Today's Visit    Examination: BP(117/82), Pulse(84), Blood Glucose(91) 1A: Level of Consciousness - Alert; keenly responsive + 0 1B: Ask Month and Age - Both Questions Right + 0 1C: Blink Eyes & Squeeze Hands - Performs Both Tasks + 0 2: Test Horizontal Extraocular Movements - Normal + 0 3: Test Visual Fields - No Visual Loss + 0 4: Test Facial Palsy (Use Grimace if Obtunded) - Normal symmetry + 0 5A: Test Left Arm Motor Drift - No Drift for 10 Seconds + 0 5B: Test Right Arm Motor Drift - No Drift for 10 Seconds + 0 6A: Test Left Leg Motor Drift - No Drift for  5 Seconds + 0 6B: Test Right Leg Motor Drift - Drift, but doesn't hit bed + 1 7: Test Limb Ataxia (FNF/Heel-Shin) - No Ataxia + 0 8: Test Sensation - Normal; No sensory loss + 0 9: Test Language/Aphasia - Normal; No aphasia + 0 10: Test Dysarthria - Normal + 0 11: Test  Extinction/Inattention - No abnormality + 0  NIHSS Score: 1   Pre-Morbid Modified Rankin Scale: 0 Points = No symptoms at all  Spoke with : ED provider I reviewed the available imaging via Rapid and initiated discussion with the primary provider  This consult was conducted in real time using interactive audio and Immunologist. Patient was informed of the technology being used for this visit and agreed to proceed. Patient located in hospital and provider located at home/office setting.   Patient is being evaluated for possible acute neurologic impairment and high probability of imminent or life-threatening deterioration. I spent total of 27 minutes providing care to this patient, including time for face to face visit via telemedicine, review of medical records, imaging studies and discussion of findings with providers, the patient and/or family. Radiologist not available: B: Remote physician workstations do not possess the same resolution, calibration, or diagnostic capabilities as hospital-based radiology reading stations, and formal radiologist read is necessary. A formal radiology interpretation was not available at the time of this evaluation.    Dr Oneil Medicine   [facility] For Inpatient follow-up with [facility] physician please call [facility] at [phone]. As we are not an outpatient service for any post hospital discharge needs please contact the hospital for assistance. If you have any questions for the [facility] physicians or need to reconsult for clinical or diagnostic changes please contact us  via [facility] at [phone].   Signature : [facility]

## 2024-05-11 DIAGNOSIS — M6281 Muscle weakness (generalized): Secondary | ICD-10-CM | POA: Diagnosis not present

## 2024-05-11 DIAGNOSIS — R0789 Other chest pain: Secondary | ICD-10-CM | POA: Diagnosis not present

## 2024-05-11 DIAGNOSIS — R29700 NIHSS score 0: Secondary | ICD-10-CM | POA: Diagnosis not present

## 2024-05-11 DIAGNOSIS — R200 Anesthesia of skin: Secondary | ICD-10-CM | POA: Diagnosis not present

## 2024-05-11 DIAGNOSIS — I639 Cerebral infarction, unspecified: Secondary | ICD-10-CM | POA: Diagnosis not present

## 2024-05-12 ENCOUNTER — Telehealth: Payer: Self-pay

## 2024-05-12 NOTE — Transitions of Care (Post Inpatient/ED Visit) (Signed)
   05/12/2024  Name: Kathy Steele MRN: [id number] DOB: 1976-07-26  Today's TOC FU Call Status: Today's TOC FU Call Status:: Unsuccessful Call (1st Attempt) Unsuccessful Call (1st Attempt) Date: 05/12/24  Attempted to reach the patient regarding the most recent Inpatient/ED visit.  Follow Up Plan: Additional outreach attempts will be made to reach the patient to complete the Transitions of Care (Post Inpatient/ED visit) call.   Signature  Charmaine Bloodgood, LPN [facility] Health Advisor [facility] You Are. We Are. [facility] Direct Dial [phone]

## 2024-05-19 ENCOUNTER — Ambulatory Visit (INDEPENDENT_AMBULATORY_CARE_PROVIDER_SITE_OTHER): Admitting: Family Medicine

## 2024-05-19 ENCOUNTER — Encounter: Payer: Self-pay | Admitting: Family Medicine

## 2024-05-19 VITALS — BP 122/70 | HR 100 | Resp 12 | Ht 64.0 in | Wt 159.0 lb

## 2024-05-19 DIAGNOSIS — N946 Dysmenorrhea, unspecified: Secondary | ICD-10-CM

## 2024-05-19 DIAGNOSIS — R2990 Unspecified symptoms and signs involving the nervous system: Secondary | ICD-10-CM

## 2024-05-19 NOTE — Patient Instructions (Addendum)
 A few things to remember from today's visit:  Neurosensory deficit  Dysmenorrhea, unspecified Let me know if you need a new referral for gynecologist. Keep appts with cardio and neurologist.  Do not use My Chart to request refills or for acute issues that need immediate attention. If you send a my chart message, it may take a few days to be addressed, specially if I am not in the office.  Please be sure medication list is accurate. If a new problem present, please set up appointment sooner than planned today.

## 2024-05-19 NOTE — Progress Notes (Signed)
 HPI: Kathy Steele is a 48 y.o. female with past medical history significant for anxiety, migraine headaches, and hyperlipidemia who is here today to follow on recent ED visit/hospitalization.  She has presented to the ED 3 times in the past month. She first went to the ED on 04/27/24 with acute pelvic pain. Her pelvic pain typically occurs while on her menses; symptoms have resolved. She reports a hx of uterine fibroids and adenomyosis and is followed by gynecology at [facility]. Given she has not tolerated birth control in the past, she is interested in a uterine artery embolization. States that she has seen different gynecologist in the same practice because providers turnover , which has been frustrating.  Pt reports seeing Dx of diabetes on her discharge summary after UC visit 04/27/24, given she had an upcoming cruise set to depart on 8/16, she took 2 capsules of Dr. Gerarda Blood Sugar 24 hours Extra Strength Support on 8/15. She notes this was her first and only time taking this supplement. She also did not eat food most of the day.   On 8/15 she experienced an episode of chest tightness immediately followed by sudden onset of right sided numbness in face and right arm hand. She presented to the [facility] ED shortly after onset of sx. While at the ED she also experiences right sided weakness and facial soreness. Labs and imaging were reassuring with no remarkable findings. As her symptoms subsided, she decided to leave the ED due to the long wait time.   [place] with plans to fly to [facility], where her cruise was departing from, however, while in the parking garage she had a second episode with similar symptoms. She decided to go to the [facility] ED and was placed in inpatient observation. While admitted, she had a stroke work up and was evaluated by neurology and cardiology. Labs and imaging were unremarkable. She reported taking the 2 capsules of  the supplement mentioned above. After her care team researched and tested the supplements, it was determined one of the ingredients, banaba, was causing her stroke-like symptoms. As her symptoms continued to subside, she was discharged and scheduled for a follow up with neurology and cardiology. She has not had additional episodes since her discharge from [facility].   Troponin negative x 4.   Contains abnormal data Comprehensive Metabolic Panel (CMP) Order: [id number] Component Ref Range & Units 9 d ago  Sodium 135 - 145 mmol/L 135  Potassium   Comment: SPECIMEN GROSSLY HEMOLYZED. UNABLE TO REPORT DUE TO INTERFERENCE CAUSED BY THIS DEGREE OF HEMOLYSIS.  Chloride 98 - 108 mmol/L 105  Carbon Dioxide (CO2) 21 - 30 mmol/L 19 Low   Urea  Nitrogen (BUN) 7 - 20 mg/dL 14  Creatinine 0.4 - 1.0 mg/dL 0.6  Glucose 70 - 859 mg/dL 97  Comment: Interpretive Data: Above is the NONFASTING reference range.  Below are the FASTING reference ranges: NORMAL:      70-99 mg/dL PREDIABETES: 899-874 mg/dL DIABETES:    > 874 mg/dL  Calcium 8.7 - 89.7 mg/dL 9.1  AST (Aspartate Aminotransferase) 15 - 41 U/L 39    ALT (Alanine Aminotransferase) 10 - 39 U/L 19    Bilirubin, Total 0.4 - 1.5 mg/dL 1.2    Alk Phos (Alkaline Phosphatase) 24 - 110 U/L 54    Albumin 3.5 - 4.8 g/dL 3.9  Protein, Total 6.2 - 8.1 g/dL 7.8  Anion Gap 3 - 12 mmol/L 11  BUN/CREA Ratio  6 - 27 22  Glomerular Filtration Rate (eGFR) mL/min/1.73sq m 113   Complete Blood Count (CBC) with Differential Order: [id number] Component Ref Range & Units 9 d ago  WBC (White Blood Cell Count) 3.2 - 9.8 x10^9/L 7.6  Hemoglobin 11.7 - 15.5 g/dL 87.4  Hematocrit 64.9 - 45.0 % 36.8  Platelets 150 - 450 x10^9/L 434  MCV (Mean Corpuscular Volume) 80 - 98 fL 90  MCH (Mean Corpuscular Hemoglobin) 26.5 - 34.0 pg 30.4  MCHC (Mean Corpuscular Hemoglobin Concentration) 31.0 - 36.0 % 34.0  RBC (Red Blood Cell Count) 3.77 - 5.16 x10^12/L  4.11  RDW-CV (Red Cell Distribution Width) 11.5 - 14.5 % 13.4  NRBC (Nucleated Red Blood Cell Count) <0.00 x10^9/L 0.00  NRBC % (Nucleated Red Blood Cell %) % 0.0  MPV (Mean Platelet Volume) 7.2 - 11.7 fL 9.9  Neutrophil Count 2.0 - 8.6 x10^9/L 5.1  Neutrophil % 37.0 - 80.0 % 66.8  Lymphocyte Count 0.6 - 4.2 x10^9/L 1.8  Lymphocyte % 10.0 - 50.0 % 23.6  Monocyte Count 0.0 - 0.9 x10^9/L 0.6  Monocyte % 0.0 - 12.0 % 8.2  Eosinophil Count 0.00 - 0.70 x10^9/L 0.04  Eosinophil % 0.0 - 7.0 % 0.5  Basophil Count 0.00 - 0.20 x10^9/L 0.04  Basophil % 0.0 - 2.0 % 0.5  Slide Review/Morphology Yes  Comment: Blood film reviewed, instrument counts confirmed,  Immature Granulocyte Count <=0.06 x10^9/L 0.03  Immature Granulocyte % <=0.7 % 0.4  Immature Platelet Fraction 1.6 - 8.5 % 1.7   APTT, PT/INR, urine tox screening, ethanol,and pregnancy test negative.  Lab Results  Component Value Date   WBC 8.2 05/09/2024   HGB 11.7 (L) 05/09/2024   HCT 36.1 05/09/2024   MCV 89.4 05/09/2024   PLT 390 05/09/2024   Brain MRI 05/11/24: Normal. Head, neck, chest CTA: 05/10/24: 1.  No acute intracranial abnormality.  2.  No intracranial large vessel occlusion. 3.  No hemodynamically significant arterial stenosis in the neck.   Denies any current weakness, numbness, migraines/headaches, visual changes,  Although pt has a hx of migraines, she did not have migraines/headaches during either episode.   She notes she has had increased work-related stress and anxiety at the time of onset of sx.  Review of Systems  Constitutional:  Negative for activity change, appetite change, chills and fever.  HENT:  Negative for nosebleeds, sore throat and trouble swallowing.   Eyes:  Negative for redness and visual disturbance.  Respiratory:  Negative for cough, shortness of breath and wheezing.   Cardiovascular:  Negative for chest pain, palpitations and leg swelling.  Gastrointestinal:  Negative for  abdominal pain, nausea and vomiting.  Endocrine: Negative for cold intolerance and heat intolerance.  Genitourinary:  Negative for decreased urine volume, dysuria and hematuria.  Musculoskeletal:  Negative for gait problem and joint swelling.  Skin:  Negative for rash.  Neurological:  Negative for seizures, syncope and facial asymmetry.  Psychiatric/Behavioral:  Negative for confusion and hallucinations.   See other pertinent positives and negatives in HPI.  Current Outpatient Medications on File Prior to Visit  Medication Sig Dispense Refill   Aspirin -Acetaminophen-Caffeine (GOODYS EXTRA STRENGTH PO) Take 1 packet by mouth daily as needed (headache).      BIOTIN PO 2 times daily.     BLACK CURRANT SEED OIL PO Take by mouth.     cetirizine (ZYRTEC) 10 MG tablet Take 10 mg by mouth daily.     ELDERBERRY PO Take by mouth.  fluticasone  (FLONASE ) 50 MCG/ACT nasal spray Place 2 sprays into both nostrils daily. 16 g 0   Multiple Vitamin (MULTIVITAMIN WITH MINERALS) TABS tablet Take 1 tablet by mouth daily.     naproxen  (NAPROSYN ) 500 MG tablet Take 1 tablet (500 mg total) by mouth 2 (two) times daily. 30 tablet 0   No current facility-administered medications on file prior to visit.   Past Medical History:  Diagnosis Date   Headache(784.0)    History of chickenpox    Allergies  Allergen Reactions   Doxycycline  Other (See Comments)   Escitalopram Oxalate     REACTION: Nausea, Ha and felt spacy   Social History   Socioeconomic History   Marital status: Married    Spouse name: Not on file   Number of children: Not on file   Years of education: Not on file   Highest education level: Not on file  Occupational History   Not on file  Tobacco Use   Smoking status: Former    Current packs/day: 0.00    Types: Cigarettes    Quit date: 05/09/2013    Years since quitting: 11.0   Smokeless tobacco: Never   Tobacco comments:    Stopped 1 year ago  Vaping Use   Vaping status: Never  Used  Substance and Sexual Activity   Alcohol use: No    Alcohol/week: 0.0 standard drinks of alcohol    Comment: occasional   Drug use: No   Sexual activity: Not on file  Other Topics Concern   Not on file  Social History Narrative   HH of 3 husband and son no tobacco    Mom smokes when visits   Sleep usually ok   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  Physical Exam Vitals and nursing note reviewed.  Constitutional:      General: She is not in acute distress.    Appearance: She is well-developed.  HENT:     Head: Normocephalic and atraumatic.     Mouth/Throat:     Mouth: Mucous membranes are moist.     Pharynx: Oropharynx is clear.  Eyes:     Conjunctiva/sclera: Conjunctivae normal.  Cardiovascular:     Rate and Rhythm: Normal rate and regular rhythm.     Pulses:          Dorsalis pedis pulses are 2+ on the right side and 2+ on the left side.     Heart sounds: No murmur heard. Pulmonary:     Effort: Pulmonary effort is normal. No respiratory distress.     Breath sounds: Normal breath sounds.  Abdominal:     Palpations: Abdomen is soft. There is no hepatomegaly or mass.     Tenderness: There is no abdominal tenderness.  Lymphadenopathy:     Cervical: No cervical adenopathy.  Skin:  General: Skin is warm.     Findings: No erythema or rash.  Neurological:     General: No focal deficit present.     Mental Status: She is alert and oriented to person, place, and time.     Cranial  Nerves: No cranial nerve deficit.     Gait: Gait normal.  Psychiatric:        Mood and Affect: Mood and affect normal.    ASSESSMENT AND PLAN: Ms. BRITTANI PURDUM was seen today for an ED/hospitalization follow up.  Neurosensory deficit Symptoms have resolved. Neurology workup negative. Apparently thought to be caused by a OTC supplement she started for glucose control,banaba. Differential diagnosis discussed. She has an appointment with neurologist and cardiologist. Clearly instructed about warning signs.  Dysmenorrhea, unspecified Currently asymptomatic, it seems to be chronic and present during menses. Differential Dx discussed. She has not tolerated hormonal therapy in the past and she is not interested in hysterectomy. Pelvic/transvaginal US  done on 04/28/2024 for acute pelvic pain revealed multiple uterine fibroids measuring up to 3.7 cm, no ovarian mass or torsion. She is planning on establishing with new gynecologist, she will let me know if referral is needed.  I spent a total of 36 minutes in both face to face and non face to face activities for this visit on the date of this encounter. During this time history was obtained and documented, examination was performed, prior labs/imaging reviewed, and assessment/plan discussed.  Return if symptoms worsen or fail to improve, for keep next appointment.  I, Vernell Forest, acting as a scribe for Shawnta Zimbelman [place], MD., have documented all relevant documentation on the behalf of Makail Watling [place], MD, as directed by   while in the presence of Genelda Roark [place], MD.  I, Bartosz Luginbill [place], MD, have reviewed all documentation for this visit. The documentation on 05/19/24 for the exam, diagnosis, procedures, and orders are all accurate and complete.   Keisuke Hollabaugh G. [place], MD  [facility]

## 2024-05-23 ENCOUNTER — Ambulatory Visit
Admission: RE | Admit: 2024-05-23 | Discharge: 2024-05-23 | Disposition: A | Discharge status: Home / Self Care | Payer: BC Managed Care – PPO | Source: Ambulatory Visit | Attending: Family Medicine | Admitting: Family Medicine

## 2024-05-23 DIAGNOSIS — Z0000 Encounter for general adult medical examination without abnormal findings: Secondary | ICD-10-CM

## 2024-05-23 DIAGNOSIS — Z1231 Encounter for screening mammogram for malignant neoplasm of breast: Secondary | ICD-10-CM | POA: Diagnosis not present

## 2024-05-27 DIAGNOSIS — R0789 Other chest pain: Secondary | ICD-10-CM | POA: Diagnosis not present

## 2024-06-05 DIAGNOSIS — R29818 Other symptoms and signs involving the nervous system: Secondary | ICD-10-CM | POA: Diagnosis not present

## 2024-06-05 DIAGNOSIS — G43109 Migraine with aura, not intractable, without status migrainosus: Secondary | ICD-10-CM | POA: Diagnosis not present

## 2024-06-05 DIAGNOSIS — Z8669 Personal history of other diseases of the nervous system and sense organs: Secondary | ICD-10-CM | POA: Diagnosis not present

## 2024-10-01 ENCOUNTER — Ambulatory Visit: Admitting: Family Medicine

## 2024-10-01 ENCOUNTER — Ambulatory Visit: Payer: Self-pay | Admitting: Family Medicine

## 2024-10-01 ENCOUNTER — Other Ambulatory Visit (HOSPITAL_COMMUNITY)
Admission: RE | Admit: 2024-10-01 | Discharge: 2024-10-01 | Disposition: A | Discharge status: Home / Self Care | Source: Ambulatory Visit | Attending: Family Medicine | Admitting: Family Medicine

## 2024-10-01 ENCOUNTER — Encounter: Payer: Self-pay | Admitting: Family Medicine

## 2024-10-01 VITALS — BP 124/80 | HR 76 | Temp 98.5°F | Resp 16 | Ht 64.0 in | Wt 163.0 lb

## 2024-10-01 DIAGNOSIS — Z124 Encounter for screening for malignant neoplasm of cervix: Secondary | ICD-10-CM

## 2024-10-01 DIAGNOSIS — Z130 Encounter for screening for diseases of the blood and blood-forming organs and certain disorders involving the immune mechanism: Secondary | ICD-10-CM | POA: Diagnosis not present

## 2024-10-01 DIAGNOSIS — Z0000 Encounter for general adult medical examination without abnormal findings: Secondary | ICD-10-CM

## 2024-10-01 DIAGNOSIS — Z1329 Encounter for screening for other suspected endocrine disorder: Secondary | ICD-10-CM

## 2024-10-01 DIAGNOSIS — Z23 Encounter for immunization: Secondary | ICD-10-CM

## 2024-10-01 DIAGNOSIS — E785 Hyperlipidemia, unspecified: Secondary | ICD-10-CM

## 2024-10-01 DIAGNOSIS — Z13228 Encounter for screening for other metabolic disorders: Secondary | ICD-10-CM | POA: Diagnosis not present

## 2024-10-01 DIAGNOSIS — D500 Iron deficiency anemia secondary to blood loss (chronic): Secondary | ICD-10-CM

## 2024-10-01 LAB — HEMOGLOBIN A1C: Hgb A1c MFr Bld: 5.5 % (ref 4.6–6.5)

## 2024-10-01 LAB — LIPID PANEL
Cholesterol: 252 mg/dL — ABNORMAL HIGH (ref 28–200)
HDL: 71.6 mg/dL
LDL Cholesterol: 158 mg/dL — ABNORMAL HIGH (ref 10–99)
NonHDL: 180.3
Total CHOL/HDL Ratio: 4
Triglycerides: 113 mg/dL (ref 10.0–149.0)
VLDL: 22.6 mg/dL (ref 0.0–40.0)

## 2024-10-01 LAB — CBC
HCT: 35.6 % — ABNORMAL LOW (ref 36.0–46.0)
Hemoglobin: 11.7 g/dL — ABNORMAL LOW (ref 12.0–15.0)
MCHC: 32.7 g/dL (ref 30.0–36.0)
MCV: 89.2 fl (ref 78.0–100.0)
Platelets: 378 K/uL (ref 150.0–400.0)
RBC: 4 Mil/uL (ref 3.87–5.11)
RDW: 14.5 % (ref 11.5–15.5)
WBC: 5.6 K/uL (ref 4.0–10.5)

## 2024-10-01 LAB — BASIC METABOLIC PANEL WITH GFR
BUN: 12 mg/dL (ref 6–23)
CO2: 26 meq/L (ref 19–32)
Calcium: 8.9 mg/dL (ref 8.4–10.5)
Chloride: 103 meq/L (ref 96–112)
Creatinine, Ser: 0.64 mg/dL (ref 0.40–1.20)
GFR: 106.19 mL/min
Glucose, Bld: 87 mg/dL (ref 70–99)
Potassium: 3.8 meq/L (ref 3.5–5.1)
Sodium: 135 meq/L (ref 135–145)

## 2024-10-01 MED ORDER — FERROUS SULFATE 325 (65 FE) MG PO TABS: 325.0000 mg | ORAL_TABLET | ORAL | 3 refills | Status: AC

## 2024-10-01 NOTE — Progress Notes (Signed)
 "   Chief Complaint  Patient presents with   Annual Exam   Discussed the use of AI scribe software for clinical note transcription with the patient, who gave verbal consent to proceed. History of Present Illness Kathy Steele is a 49 year old female with past medical history significant for anxiety, migraine headaches, and hyperlipidemia here today for her routine physical.  Last CPE: 04/24/23.  She exercises regularly, going to the gym three times a week where she uses the treadmill and does abdominal exercises and stretching. She sleeps about six to seven hours per night.  She is trying to eat healthily, incorporating fruits and vegetables into her diet, and has been using a juicer.  She drinks alcohol occasionally and does not smoke. She sees her eye care provider and dentist regularly.  Immunization History  Administered Date(s) Administered   Influenza Split 08/31/2011, 09/16/2012, 07/25/2013   Influenza Whole 07/06/2010   Influenza,inj,Quad PF,6+ Mos 07/30/2018, 08/04/2019, 08/11/2020, 08/15/2021   PFIZER(Purple Top)SARS-COV-2 Vaccination 12/31/2019, 02/18/2020   Td 07/06/2010   Tdap 08/11/2020   Health Maintenance  Topic Date Due   Hepatitis B Vaccines 19-59 Average Risk (1 of 3 - 19+ 3-dose series) Never done   Cervical Cancer Screening (HPV/Pap Cotest)  02/01/2019   Influenza Vaccine  04/25/2024   COVID-19 Vaccine (3 - Pfizer risk series) 10/17/2024 (Originally 03/17/2020)   Mammogram  05/23/2026   DTaP/Tdap/Td (3 - Td or Tdap) 08/11/2030   Colonoscopy  10/21/2033   Hepatitis C Screening  Completed   HIV Screening  Completed   Pneumococcal Vaccine  Aged Out   HPV VACCINES  Aged Out   Meningococcal B Vaccine  Aged Out   She experiences heavy vaginal bleeding, which she describes as painful and associates with fibroids. Menstrual periods are regular. She has not yet seen a gynecologist due to frequent changes in the gynecologists at her  clinic. M:15 G:2 L:1 Reports hx of abnormal pap smear and a negative colpo Bx in the past.  HLD: She is on non pharmacologic treatment.  Lab Results  Component Value Date   CHOL 228 (H) 04/24/2023   HDL 66.10 04/24/2023   LDLCALC 146 (H) 04/24/2023   LDLDIRECT 131.6 09/09/2013   TRIG 80.0 04/24/2023   CHOLHDL 3 04/24/2023   Iron deficiency anemia: Currently she is not on iron supplementation.  Lab Results  Component Value Date   WBC 8.2 05/09/2024   HGB 11.7 (L) 05/09/2024   HCT 36.1 05/09/2024   MCV 89.4 05/09/2024   PLT 390 05/09/2024   Review of Systems  Constitutional:  Positive for fatigue. Negative for activity change, appetite change, chills and fever.  HENT:  Negative for mouth sores, sore throat and trouble swallowing.   Eyes:  Negative for redness and visual disturbance.  Respiratory:  Negative for cough, shortness of breath and wheezing.   Cardiovascular:  Negative for chest pain and leg swelling.  Gastrointestinal:  Negative for abdominal pain, nausea and vomiting.  Endocrine: Negative for cold intolerance, heat intolerance, polydipsia, polyphagia and polyuria.  Genitourinary:  Positive for menstrual problem. Negative for decreased urine volume, dysuria and hematuria.  Musculoskeletal:  Negative for gait problem and joint swelling.  Skin:  Negative for color change and rash.  Allergic/Immunologic: Positive for environmental allergies.  Neurological:  Negative for syncope, weakness and headaches.  Hematological:  Negative for adenopathy. Does not bruise/bleed easily.  Psychiatric/Behavioral:  Negative for confusion and hallucinations.   All other systems reviewed and are negative.  Current Outpatient  Medications on File Prior to Visit  Medication Sig Dispense Refill   Aspirin -Acetaminophen-Caffeine (GOODYS EXTRA STRENGTH PO) Take 1 packet by mouth daily as needed (headache).      BIOTIN PO 2 times daily.     BLACK CURRANT SEED OIL PO Take by mouth.      cetirizine (ZYRTEC) 10 MG tablet Take 10 mg by mouth daily.     ELDERBERRY PO Take by mouth.     fluticasone  (FLONASE ) 50 MCG/ACT nasal spray Place 2 sprays into both nostrils daily. 16 g 0   Multiple Vitamin (MULTIVITAMIN WITH MINERALS) TABS tablet Take 1 tablet by mouth daily.     naproxen  (NAPROSYN ) 500 MG tablet Take 1 tablet (500 mg total) by mouth 2 (two) times daily. 30 tablet 0   No current facility-administered medications on file prior to visit.   Past Medical History:  Diagnosis Date   Headache(784.0)    History of chickenpox    Past Surgical History:  Procedure Laterality Date   EYE SURGERY Left 10/2022   retina repair   Allergies  Allergen Reactions   Doxycycline  Other (See Comments)   Escitalopram Oxalate     REACTION: Nausea, Ha and felt spacy   Family History  Problem Relation Age of Onset   Hypertension Mother    Diabetes Paternal Uncle    Diabetes Maternal Grandmother    Stroke Maternal Grandmother    Stroke Maternal Grandfather    Arthritis Other    Osteoarthritis Other    Social History   Socioeconomic History   Marital status: Married    Spouse name: Not on file   Number of children: Not on file   Years of education: Not on file   Highest education level: Not on file  Occupational History   Not on file  Tobacco Use   Smoking status: Former    Current packs/day: 0.00    Types: Cigarettes    Quit date: 05/09/2013    Years since quitting: 11.4   Smokeless tobacco: Never   Tobacco comments:    Stopped 1 year ago  Vaping Use   Vaping status: Never Used  Substance and Sexual Activity   Alcohol use: No    Alcohol/week: 0.0 standard drinks of alcohol    Comment: occasional   Drug use: No   Sexual activity: Not on file  Other Topics Concern   Not on file  Social History Narrative   HH of 3 husband and son no tobacco    Mom smokes when visits   Sleep usually ok   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  In past     job  supervisor  [employer]    Regular exercise- no   Sleep reg  Hours   Working hr job in [employer]                Social Drivers of Health   Tobacco Use: Medium Risk (10/01/2024)   Patient History    Smoking Tobacco Use: Former    Smokeless Tobacco Use: Never    Passive Exposure: Not on Actuary Strain: Not on file  Food Insecurity: Not on file  Transportation Needs: Not on file  Physical Activity: Not on file  Stress: Not on file  Social Connections: Not on file  Depression (PHQ2-9): Low Risk (11/19/2023)   Depression (PHQ2-9)    PHQ-2 Score: 0  Alcohol Screen: Not on file  Housing: Unknown (05/11/2024)   [facility] from [facility]   Epic    Unable to Pay for Housing in the Last Year: Not on file    Number of Times Moved in the Last Year: Not on file    At any time in the past 12 months, were you homeless or living in a shelter (including now)?: No  Utilities: Not on file  Health Literacy: Not on file   Vitals:   10/01/24 0718  BP: 124/80  Pulse: 76  Resp: 16  Temp: 98.5 F (36.9 C)   Body mass index is 27.98 kg/m.  Wt Readings from Last 3 Encounters:  10/01/24 163 lb (73.9 kg)  05/19/24 159 lb (72.1 kg)  05/09/24 164 lb (74.4 kg)   Physical Exam Vitals and nursing note reviewed. Exam conducted with a chaperone present.  Constitutional:      General: She is not in acute distress.    Appearance: She is well-developed.  HENT:     Head: Normocephalic and atraumatic.     Right Ear: Tympanic membrane, ear canal and external ear normal.     Left Ear: Tympanic membrane, ear canal and external ear normal.     Mouth/Throat:     Mouth: Mucous membranes are moist.     Pharynx: Oropharynx is clear. Uvula midline.  Eyes:     Extraocular Movements: Extraocular movements intact.     Conjunctiva/sclera: Conjunctivae normal.     Pupils: Pupils are equal, round, and reactive to light.  Neck:     Thyroid : No thyroid  mass or thyromegaly.   Cardiovascular:     Rate and Rhythm: Normal rate and regular rhythm.     Pulses:          Dorsalis pedis pulses are 2+ on the right side and 2+ on the left side.     Heart sounds: No murmur heard.    Comments: Trace pitting LE edema, bilateral. Pulmonary:     Effort: Pulmonary effort is normal. No respiratory distress.     Breath sounds: Normal breath sounds.  Abdominal:     Palpations: Abdomen is soft. There is no hepatomegaly or mass.     Tenderness: There is no abdominal tenderness.  Genitourinary:    Exam position: Lithotomy position.     Labia:        Right: No rash, tenderness, lesion or injury.        Left: No rash, tenderness, lesion or injury.      Vagina: No vaginal discharge, erythema, tenderness, bleeding or lesions.     Cervix: Discharge and friability present. No erythema.     Uterus: Not enlarged and not tender.      Adnexa:        Right: No mass, tenderness or fullness.         Left: No mass, tenderness or fullness.    Musculoskeletal:     Comments: No major deformity or signs of synovitis appreciated.  Lymphadenopathy:     Cervical: No cervical adenopathy.     Upper Body:     Right upper body: No supraclavicular adenopathy.     Left upper body: No supraclavicular adenopathy.     Lower Body: No right inguinal adenopathy. No left inguinal adenopathy.  Skin:    General: Skin is warm.     Findings: No erythema or rash.  Neurological:     General: No focal deficit present.     Mental Status: She is alert and oriented to person, place, and time.     Cranial  Nerves: No cranial nerve deficit.     Coordination: Coordination normal.     Gait: Gait normal.     Deep Tendon Reflexes:     Reflex Scores:      Bicep reflexes are 2+ on the right side and 2+ on the left side.      Patellar reflexes are 2+ on the right side and 2+ on the left side. Psychiatric:        Mood and Affect: Mood and affect normal.   ASSESSMENT AND PLAN: [name] was here today  annual physical examination.  Orders Placed This Encounter  Procedures   Lipid panel   Basic metabolic panel with GFR   Hemoglobin A1c   CBC   Lab Results  Component Value Date   WBC 5.6 10/01/2024   HGB 11.7 (L) 10/01/2024   HCT 35.6 (L) 10/01/2024   MCV 89.2 10/01/2024   PLT 378.0 10/01/2024   Lab Results  Component Value Date   NA 135 10/01/2024   CL 103 10/01/2024   K 3.8 10/01/2024   CO2 26 10/01/2024   BUN 12 10/01/2024   CREATININE 0.64 10/01/2024   GFR 106.19 10/01/2024   CALCIUM 8.9 10/01/2024   ALBUMIN 4.4 05/09/2024   GLUCOSE 87 10/01/2024   Lab Results  Component Value Date   HGBA1C 5.5 10/01/2024   Lab Results  Component Value Date   CHOL 252 (H) 10/01/2024   HDL 71.60 10/01/2024   LDLCALC 158 (H) 10/01/2024   LDLDIRECT 131.6 09/09/2013   TRIG 113.0 10/01/2024   CHOLHDL 4 10/01/2024   The 10-year ASCVD risk score (Arnett DK, et al., 2019) is: 0.8%   Values used to calculate the score:     Age: 62 years     Clinically relevant sex: Female     Is Non-Hispanic African American: Yes     Diabetic: No     Tobacco smoker: No     Systolic Blood Pressure: 124 mmHg     Is BP treated: No     HDL Cholesterol: 71.6 mg/dL     Total Cholesterol: 252 mg/dL Routine general medical examination at a health care facility Assessment & Plan: We discussed the importance of regular physical activity and healthy diet for prevention of chronic illness and/or complications.Recommend stopping juice intake. Preventive guidelines reviewed. Vaccination: Prefers to hold on hepatitis B vaccination.  Influenza vaccine given today. Pap smear collected. Next CPE in a year.   Hyperlipidemia, unspecified hyperlipidemia type Assessment & Plan: Currently on nonpharmacologic treatment. Further recommendations will be given according to lipid panel numbers.  Orders: -     Lipid panel; Future  Screening for endocrine, metabolic and immunity disorder -     Basic metabolic  panel with GFR; Future -     Hemoglobin A1c; Future  Cervical cancer screening -     Cytology - PAP  Iron deficiency anemia due to chronic blood loss Assessment & Plan: Mild. Attributed to heavy menses due to fibroids. Currently she is not on iron supplementation. Further recommendation will be given according to CBC results.  Orders: -     CBC; Future  Immunization due -     Flu vaccine trivalent PF, 6mos and older(Flulaval,Afluria,Fluarix,Fluzone)  Return in 1 year (on 10/01/2025).  Sugey Trevathan G. Aldrich Lloyd, MD  [facility]. [place] office. "

## 2024-10-01 NOTE — Patient Instructions (Addendum)
 A few things to remember from today's visit:  Routine general medical examination at a health care facility  Hyperlipidemia, unspecified hyperlipidemia type - Plan: Lipid panel  Screening for endocrine, metabolic and immunity disorder - Plan: Basic metabolic panel with GFR, Hemoglobin A1c  Cervical cancer screening - Plan: Cytology - PAP ([place]), CANCELED: Cytology - PAP ([place])  Iron deficiency anemia due to chronic blood loss - Plan: CBC  Do not use My Chart to request refills or for acute issues that need immediate attention. If you send a my chart message, it may take a few days to be addressed, specially if I am not in the office.  Please be sure medication list is accurate. If a new problem present, please set up appointment sooner than planned today.  Health Maintenance, Female Adopting a healthy lifestyle and getting preventive care are important in promoting health and wellness. Ask your health care provider about: The right schedule for you to have regular tests and exams. Things you can do on your own to prevent diseases and keep yourself healthy. What should I know about diet, weight, and exercise? Eat a healthy diet  Eat a diet that includes plenty of vegetables, fruits, low-fat dairy products, and lean protein. Do not eat a lot of foods that are high in solid fats, added sugars, or sodium. Maintain a healthy weight Body mass index (BMI) is used to identify weight problems. It estimates body fat based on height and weight. Your health care provider can help determine your BMI and help you achieve or maintain a healthy weight. Get regular exercise Get regular exercise. This is one of the most important things you can do for your health. Most adults should: Exercise for at least 150 minutes each week. The exercise should increase your heart rate and make you sweat (moderate-intensity exercise). Do strengthening exercises at least twice a week. This is in addition  to the moderate-intensity exercise. Spend less time sitting. Even light physical activity can be beneficial. Watch cholesterol and blood lipids Have your blood tested for lipids and cholesterol at 47 years of age, then have this test every 5 years. Have your cholesterol levels checked more often if: Your lipid or cholesterol levels are high. You are older than 47 years of age. You are at high risk for heart disease. What should I know about cancer screening? Depending on your health history and family history, you may need to have cancer screening at various ages. This may include screening for: Breast cancer. Cervical cancer. Colorectal cancer. Skin cancer. Lung cancer. What should I know about heart disease, diabetes, and high blood pressure? Blood pressure and heart disease High blood pressure causes heart disease and increases the risk of stroke. This is more likely to develop in people who have high blood pressure readings or are overweight. Have your blood pressure checked: Every 3-5 years if you are 63-32 years of age. Every year if you are 11 years old or older. Diabetes Have regular diabetes screenings. This checks your fasting blood sugar level. Have the screening done: Once every three years after age 21 if you are at a normal weight and have a low risk for diabetes. More often and at a younger age if you are overweight or have a high risk for diabetes. What should I know about preventing infection? Hepatitis B If you have a higher risk for hepatitis B, you should be screened for this virus. Talk with your health care provider to find out if you  are at risk for hepatitis B infection. Hepatitis C Testing is recommended for: Everyone born from [birth year] through 1963. Anyone with known risk factors for hepatitis C. Sexually transmitted infections (STIs) Get screened for STIs, including gonorrhea and chlamydia, if: You are sexually active and are younger than 47 years of age. You  are older than 47 years of age and your health care provider tells you that you are at risk for this type of infection. Your sexual activity has changed since you were last screened, and you are at increased risk for chlamydia or gonorrhea. Ask your health care provider if you are at risk. Ask your health care provider about whether you are at high risk for HIV. Your health care provider may recommend a prescription medicine to help prevent HIV infection. If you choose to take medicine to prevent HIV, you should first get tested for HIV. You should then be tested every 3 months for as long as you are taking the medicine. Pregnancy If you are about to stop having your period (premenopausal) and you may become pregnant, seek counseling before you get pregnant. Take 400 to 800 micrograms (mcg) of folic acid every day if you become pregnant. Ask for birth control (contraception) if you want to prevent pregnancy. Osteoporosis and menopause Osteoporosis is a disease in which the bones lose minerals and strength with aging. This can result in bone fractures. If you are 70 years old or older, or if you are at risk for osteoporosis and fractures, ask your health care provider if you should: Be screened for bone loss. Take a calcium or vitamin D supplement to lower your risk of fractures. Be given hormone replacement therapy (HRT) to treat symptoms of menopause. Follow these instructions at home: Alcohol use Do not drink alcohol if: Your health care provider tells you not to drink. You are pregnant, may be pregnant, or are planning to become pregnant. If you drink alcohol: Limit how much you have to: 0-1 drink a day. Know how much alcohol is in your drink. In the [place] one drink equals one 12 oz bottle of beer (355 mL), one 5 oz glass of wine (148 mL), or one 1 oz glass of hard liquor (44 mL). Lifestyle Do not use any products that contain nicotine or tobacco. These products include cigarettes, chewing  tobacco, and vaping devices, such as e-cigarettes. If you need help quitting, ask your health care provider. Do not use street drugs. Do not share needles. Ask your health care provider for help if you need support or information about quitting drugs. General instructions Schedule regular health, dental, and eye exams. Stay current with your vaccines. Tell your health care provider if: You often feel depressed. You have ever been abused or do not feel safe at home. Summary Adopting a healthy lifestyle and getting preventive care are important in promoting health and wellness. Follow your health care provider's instructions about healthy diet, exercising, and getting tested or screened for diseases. Follow your health care provider's instructions on monitoring your cholesterol and blood pressure. This information is not intended to replace advice given to you by your health care provider. Make sure you discuss any questions you have with your health care provider. Document Revised: 01/31/2021 Document Reviewed: 01/31/2021 Elsevier Patient Education  2024 [employer].

## 2024-10-01 NOTE — Assessment & Plan Note (Signed)
 Mild. Attributed to heavy menses due to fibroids. Currently she is not on iron supplementation. Further recommendation will be given according to CBC results.

## 2024-10-01 NOTE — Telephone Encounter (Signed)
 L[facility]

## 2024-10-01 NOTE — Assessment & Plan Note (Addendum)
 Currently on nonpharmacologic treatment. Further recommendations will be given according to lipid panel numbers.

## 2024-10-01 NOTE — Assessment & Plan Note (Addendum)
 We discussed the importance of regular physical activity and healthy diet for prevention of chronic illness and/or complications.Recommend stopping juice intake. Preventive guidelines reviewed. Vaccination: Prefers to hold on hepatitis B vaccination.  Influenza vaccine given today. Pap smear collected. Next CPE in a year.

## 2024-10-02 LAB — CYTOLOGY - PAP
Chlamydia: NEGATIVE
Comment: NEGATIVE
Comment: NEGATIVE
Comment: NEGATIVE
Comment: NORMAL
Diagnosis: NEGATIVE
High risk HPV: NEGATIVE
Neisseria Gonorrhea: NEGATIVE
Trichomonas: NEGATIVE

## 2024-10-03 NOTE — Telephone Encounter (Signed)
 L[facility]

## 2024-10-07 NOTE — Telephone Encounter (Signed)
 L[facility]

## 2024-10-25 ENCOUNTER — Ambulatory Visit (HOSPITAL_COMMUNITY): Payer: Self-pay

## 2024-10-27 ENCOUNTER — Ambulatory Visit: Payer: Self-pay

## 2024-10-27 NOTE — Telephone Encounter (Signed)
 FYI Only or Action Required?: FYI only for provider: Pt wanting to go to UC today.  Patient was last seen in primary care on 10/01/2024 by Jordan, Betty G, MD.  Called Nurse Triage reporting Skin Discoloration.  Symptoms began several weeks ago.  Interventions attempted: Prescription medications: clotrimazole betamethasone cream.  Symptoms are: gradually worsening.  Triage Disposition: See PCP When Office is Open (Within 3 Days)  Patient/caregiver understands and will follow disposition?: No   Reason for Disposition  Nursing judgment or information in reference  Answer Assessment - Initial Assessment Questions Pt declined scheduling tomorrow, as she stated she is nervous and wants to be seen today. She will go to UC.   1. REASON FOR CALL: What is your main concern right now?     Discoloration under both breasts, and back of neck  2. ONSET: When did the discoloration start?     A couple weeks ago, started on back of the neck 5 days ago  3. RELIEVING AND AGGRAVATING FACTORS: What makes it better or worse? (e.g., certain activities, rest)     Did a virtual visit the other week and was given Clotrimazole/betamethasone cream  4. FEVER: Do you have a fever?     Denies  5. OTHER SYMPTOMS: Do you have any other new symptoms?     Intermittent Headache, nausea, belly button has a blue-ish greenish color around it, denies pain.  Protocols used: No Guideline Available-A-AH  Message from Luling F sent at 10/27/2024  8:41 AM EST  Summary: Worsening discoloration of skin-new symptoms   Reason for Triage: Pt states she had a vv a couple of weeks ago-discoloration under breast-getting worse, and discoloration in the back of the neck now. Belly button is bluish green color.

## 2024-10-28 ENCOUNTER — Ambulatory Visit: Admitting: Family Medicine

## 2024-10-28 ENCOUNTER — Encounter: Payer: Self-pay | Admitting: Family Medicine

## 2024-10-28 VITALS — BP 120/80 | Temp 97.7°F | Wt 162.9 lb

## 2024-10-28 DIAGNOSIS — R21 Rash and other nonspecific skin eruption: Secondary | ICD-10-CM | POA: Diagnosis not present

## 2024-10-28 MED ORDER — KETOCONAZOLE 2 % EX CREA: 1.0000 | TOPICAL_CREAM | Freq: Two times a day (BID) | CUTANEOUS | 1 refills | Status: AC | PRN

## 2024-10-28 NOTE — Progress Notes (Signed)
 "  Established Patient Office Visit  Subjective   Patient ID: Kathy Steele, female    DOB: 1975/12/10  Age: 49 y.o. MRN: [id number]  Chief Complaint  Patient presents with   Skin Discoloration    HPI    Kathy Steele is seen with some skin discoloration which she first noticed couple weeks ago under both breasts.  Denied any significant pruritus.  She subsequently noticed little bit of hyperpigmentation posterior neck region.  Again, no pruritus.  She did virtual visit with provider through her work couple weeks ago and was prescribed clotrimazole/betamethasone cream.  She feels like this may have helped initially but after stopping this medication she felt like she had some sloughing off of the skin and seemed to have some recurrence of pigment change.  She tries to keep skin dry under her breast as much as possible and well aerated.  No recent prednisone  or antibiotic use.  She does not have diabetes and had recent A1c of 5.5%.  No history of similar rash.  Denies any rash in her groin or axillary region.  No recent change of jewelry around her neck region.  Past Medical History:  Diagnosis Date   Headache(784.0)    History of chickenpox    Past Surgical History:  Procedure Laterality Date   EYE SURGERY Left 10/2022   retina repair    reports that she quit smoking about 11 years ago. Her smoking use included cigarettes. She has never used smokeless tobacco. She reports that she does not drink alcohol and does not use drugs. family history includes Arthritis in an other family member; Diabetes in her maternal grandmother and paternal uncle; Hypertension in her mother; Osteoarthritis in an other family member; Stroke in her maternal grandfather and maternal grandmother. Allergies[1]  Review of Systems  Constitutional:  Negative for chills and fever.  Skin:  Positive for rash. Negative for itching.      Objective:     BP 120/80   Temp 97.7 F (36.5 C) (Oral)   Wt 162 lb  14.4 oz (73.9 kg)   LMP 09/18/2024 (Approximate)   BMI 27.96 kg/m  BP Readings from Last 3 Encounters:  10/28/24 120/80  10/01/24 124/80  05/19/24 122/70   Wt Readings from Last 3 Encounters:  10/28/24 162 lb 14.4 oz (73.9 kg)  10/01/24 163 lb (73.9 kg)  05/19/24 159 lb (72.1 kg)      Physical Exam Vitals reviewed.  Constitutional:      General: She is not in acute distress.    Appearance: She is not ill-appearing.  Cardiovascular:     Rate and Rhythm: Normal rate and regular rhythm.  Pulmonary:     Effort: Pulmonary effort is normal.     Breath sounds: Normal breath sounds. No wheezing or rales.  Skin:    Comments: Skin under her breast examined with nurse chaperone present.  She has some darker pigmented skin that appears to be sloughing off slightly under the breast region but the underlying skin appears normal.  No erythema.  Nonscaly.  No vesicles or pustules.  Skin of posterior neck reveals slightly hyperpigmented nonscaly rash horizontally oriented confined to the posterior neck region.  No anterior or lateral neck involvement.  Neurological:     Mental Status: She is alert.      No results found for any visits on 10/28/24.    The 10-year ASCVD risk score (Arnett DK, et al., 2019) is: 0.7%    Assessment & Plan:  She has 2 areas of rash including under both breast and posterior neck.  Posterior neck rash is nonpruritic but is slightly hyperpigmented but nonscaly.  Does not appear to be a clearly allergic rash since there is no pruritus and she has never had any reactions to jewelry previously.  Etiology unclear.  Rash underneath the breast appears to be clearing somewhat.  She has what appears to be some dead skin superficially that is sloughing off but underlying skin appears to be healthy.  No evidence for active inflammation.  No redness.  Nonscaly.  -Recommend keep skin dry as possible under breast and well aerated - Consider switching to plain Nizoral   cream twice daily as needed - Touch base if rash not fully clearing in the next few weeks - We did also discuss the fact that sometimes hyperpigmentation can reflect inflammation of the skin and can take several weeks to fully resolve   Wolm Scarlet, MD     [1]  Allergies Allergen Reactions   Doxycycline  Other (See Comments)   Escitalopram Oxalate     REACTION: Nausea, Ha and felt spacy   "
